# Patient Record
Sex: Male | Born: 1955
Health system: Southern US, Community
[De-identification: ages and names within clinical notes are randomized; demographics above are authoritative.]

## PROBLEM LIST (undated history)

## (undated) DIAGNOSIS — J449 Chronic obstructive pulmonary disease, unspecified: Secondary | ICD-10-CM

## (undated) DIAGNOSIS — S82142A Displaced bicondylar fracture of left tibia, initial encounter for closed fracture: Secondary | ICD-10-CM

## (undated) DIAGNOSIS — G8220 Paraplegia, unspecified: Secondary | ICD-10-CM

## (undated) DIAGNOSIS — K635 Polyp of colon: Secondary | ICD-10-CM

## (undated) DIAGNOSIS — G709 Myoneural disorder, unspecified: Secondary | ICD-10-CM

## (undated) DIAGNOSIS — Z8619 Personal history of other infectious and parasitic diseases: Secondary | ICD-10-CM

## (undated) DIAGNOSIS — I639 Cerebral infarction, unspecified: Secondary | ICD-10-CM

## (undated) DIAGNOSIS — B029 Zoster without complications: Secondary | ICD-10-CM

## (undated) HISTORY — PX: CHOLECYSTECTOMY: SHX55

## (undated) HISTORY — DX: Personal history of other infectious and parasitic diseases: Z86.19

## (undated) HISTORY — DX: Myoneural disorder, unspecified: G70.9

## (undated) HISTORY — PX: BACK SURGERY: SHX140

## (undated) HISTORY — PX: BUTTOCK MASS EXCISION: SHX1279

## (undated) HISTORY — DX: Zoster without complications: B02.9

## (undated) HISTORY — DX: Displaced bicondylar fracture of left tibia, initial encounter for closed fracture: S82.142A

## (undated) HISTORY — DX: Polyp of colon: K63.5

## (undated) HISTORY — DX: Chronic obstructive pulmonary disease, unspecified: J44.9

## (undated) HISTORY — PX: EYE SURGERY: SHX253

## (undated) HISTORY — PX: SPINE SURGERY: SHX786

---

## 2007-10-29 ENCOUNTER — Encounter: Payer: Self-pay | Admitting: Specialist

## 2007-11-20 ENCOUNTER — Encounter: Payer: Self-pay | Admitting: Specialist

## 2011-07-19 DIAGNOSIS — Z13220 Encounter for screening for lipoid disorders: Secondary | ICD-10-CM | POA: Diagnosis not present

## 2011-07-19 DIAGNOSIS — R7309 Other abnormal glucose: Secondary | ICD-10-CM | POA: Diagnosis not present

## 2011-07-19 DIAGNOSIS — K589 Irritable bowel syndrome without diarrhea: Secondary | ICD-10-CM | POA: Diagnosis not present

## 2011-07-19 DIAGNOSIS — M25519 Pain in unspecified shoulder: Secondary | ICD-10-CM | POA: Diagnosis not present

## 2011-07-19 DIAGNOSIS — Z125 Encounter for screening for malignant neoplasm of prostate: Secondary | ICD-10-CM | POA: Diagnosis not present

## 2012-01-10 DIAGNOSIS — B829 Intestinal parasitism, unspecified: Secondary | ICD-10-CM | POA: Diagnosis not present

## 2012-01-10 DIAGNOSIS — M25519 Pain in unspecified shoulder: Secondary | ICD-10-CM | POA: Diagnosis not present

## 2012-03-28 ENCOUNTER — Ambulatory Visit: Payer: Self-pay | Admitting: Family Medicine

## 2012-03-28 DIAGNOSIS — R238 Other skin changes: Secondary | ICD-10-CM | POA: Diagnosis not present

## 2012-03-28 DIAGNOSIS — M25519 Pain in unspecified shoulder: Secondary | ICD-10-CM | POA: Diagnosis not present

## 2012-03-28 DIAGNOSIS — S99919A Unspecified injury of unspecified ankle, initial encounter: Secondary | ICD-10-CM | POA: Diagnosis not present

## 2012-03-28 DIAGNOSIS — S8990XA Unspecified injury of unspecified lower leg, initial encounter: Secondary | ICD-10-CM | POA: Diagnosis not present

## 2012-04-03 ENCOUNTER — Emergency Department: Payer: Self-pay | Admitting: Emergency Medicine

## 2012-04-03 DIAGNOSIS — M79609 Pain in unspecified limb: Secondary | ICD-10-CM | POA: Diagnosis not present

## 2012-04-03 DIAGNOSIS — S90129A Contusion of unspecified lesser toe(s) without damage to nail, initial encounter: Secondary | ICD-10-CM | POA: Diagnosis not present

## 2012-04-03 DIAGNOSIS — S8990XA Unspecified injury of unspecified lower leg, initial encounter: Secondary | ICD-10-CM | POA: Diagnosis not present

## 2012-04-08 DIAGNOSIS — M79609 Pain in unspecified limb: Secondary | ICD-10-CM | POA: Diagnosis not present

## 2012-04-22 DIAGNOSIS — K5909 Other constipation: Secondary | ICD-10-CM | POA: Diagnosis not present

## 2012-04-22 DIAGNOSIS — M25519 Pain in unspecified shoulder: Secondary | ICD-10-CM | POA: Diagnosis not present

## 2012-05-20 DIAGNOSIS — I10 Essential (primary) hypertension: Secondary | ICD-10-CM | POA: Diagnosis not present

## 2012-05-20 DIAGNOSIS — E785 Hyperlipidemia, unspecified: Secondary | ICD-10-CM | POA: Diagnosis not present

## 2012-05-20 DIAGNOSIS — L98499 Non-pressure chronic ulcer of skin of other sites with unspecified severity: Secondary | ICD-10-CM | POA: Diagnosis not present

## 2012-05-20 DIAGNOSIS — I739 Peripheral vascular disease, unspecified: Secondary | ICD-10-CM | POA: Diagnosis not present

## 2012-07-29 DIAGNOSIS — Z125 Encounter for screening for malignant neoplasm of prostate: Secondary | ICD-10-CM | POA: Diagnosis not present

## 2012-07-29 DIAGNOSIS — R7309 Other abnormal glucose: Secondary | ICD-10-CM | POA: Diagnosis not present

## 2012-07-29 DIAGNOSIS — K589 Irritable bowel syndrome without diarrhea: Secondary | ICD-10-CM | POA: Diagnosis not present

## 2012-07-29 DIAGNOSIS — Z136 Encounter for screening for cardiovascular disorders: Secondary | ICD-10-CM | POA: Diagnosis not present

## 2012-07-29 LAB — LIPID PANEL
Cholesterol: 162 mg/dL (ref 0–200)
HDL: 68 mg/dL (ref 35–70)
LDL Cholesterol: 80 mg/dL
Triglycerides: 69 mg/dL (ref 40–160)
VLDL: 14 mg/dL

## 2013-06-02 DIAGNOSIS — K589 Irritable bowel syndrome without diarrhea: Secondary | ICD-10-CM | POA: Diagnosis not present

## 2013-06-02 DIAGNOSIS — J329 Chronic sinusitis, unspecified: Secondary | ICD-10-CM | POA: Diagnosis not present

## 2013-06-02 DIAGNOSIS — R7309 Other abnormal glucose: Secondary | ICD-10-CM | POA: Diagnosis not present

## 2014-03-16 DIAGNOSIS — J0100 Acute maxillary sinusitis, unspecified: Secondary | ICD-10-CM | POA: Diagnosis not present

## 2014-06-18 IMAGING — CR RIGHT FIFTH TOE
1 series · 3 of 3 positions shown · non-contrast
Comparison: none

REASON FOR EXAM: toe injury
COMMENTS:

[Series 1: ap · 0.17mm/px · 3 of 3 slices shown]
[im 1/3]
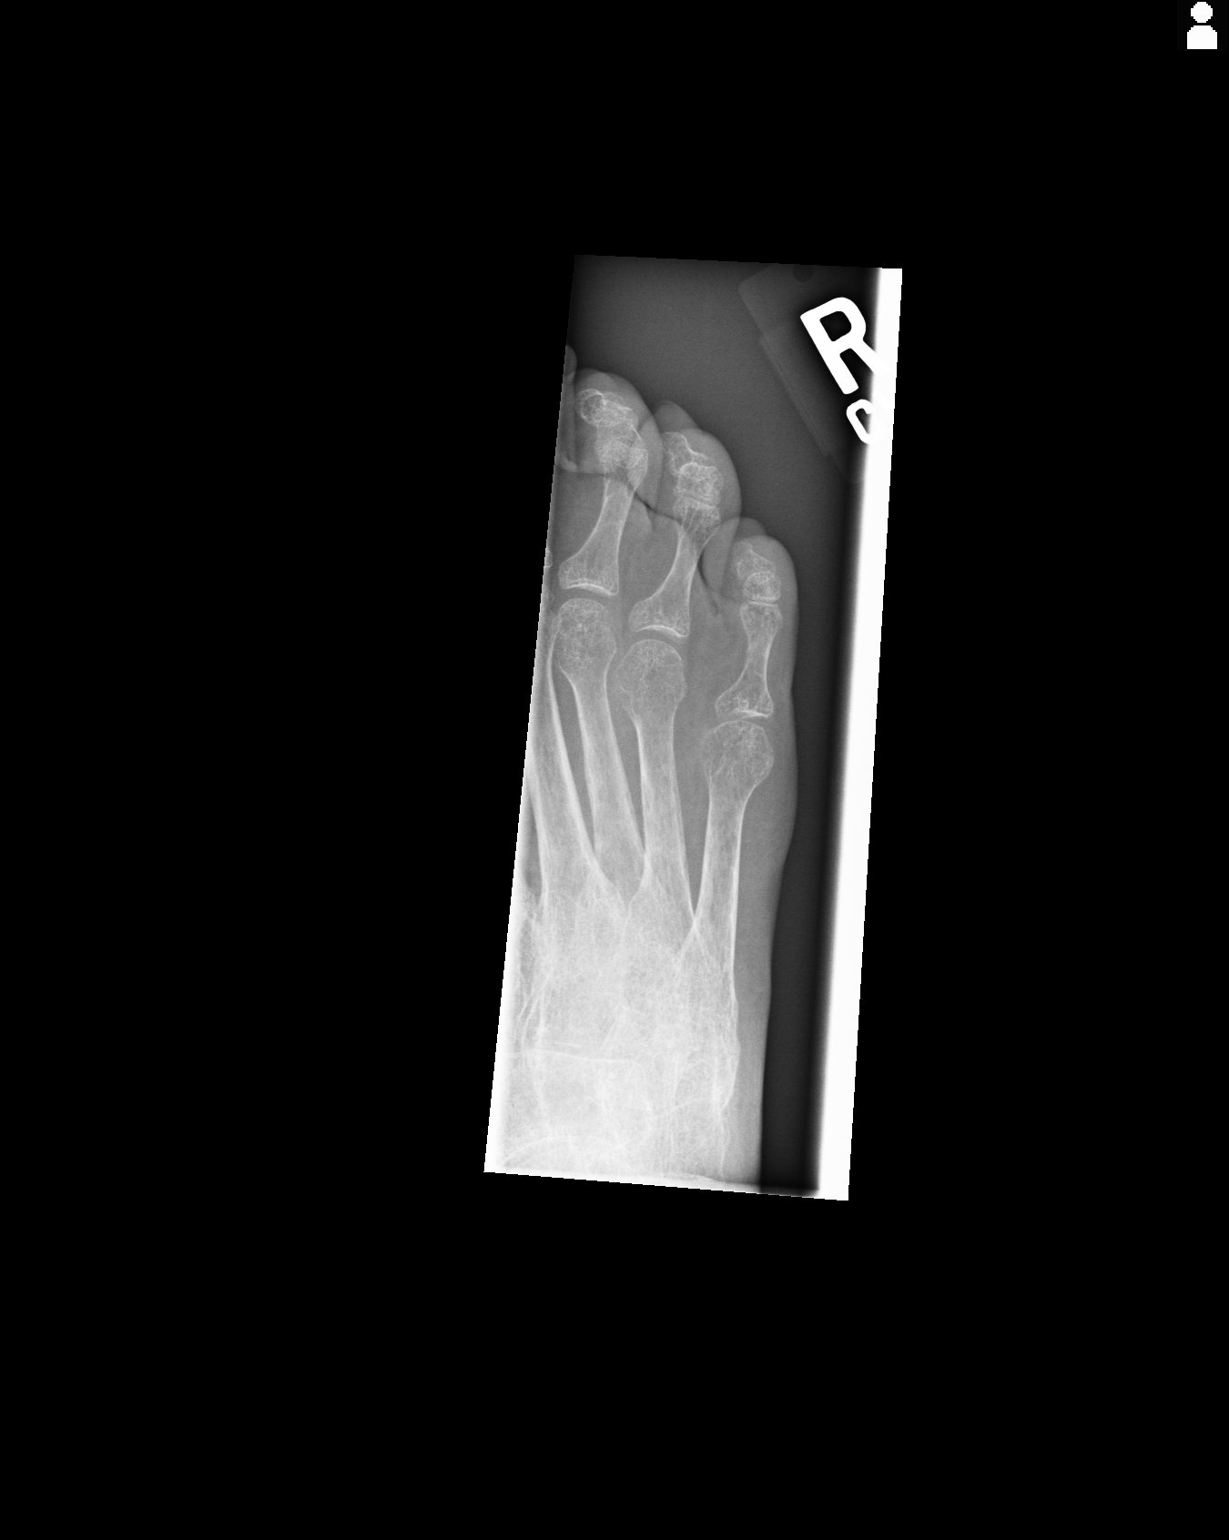
[im 2/3]
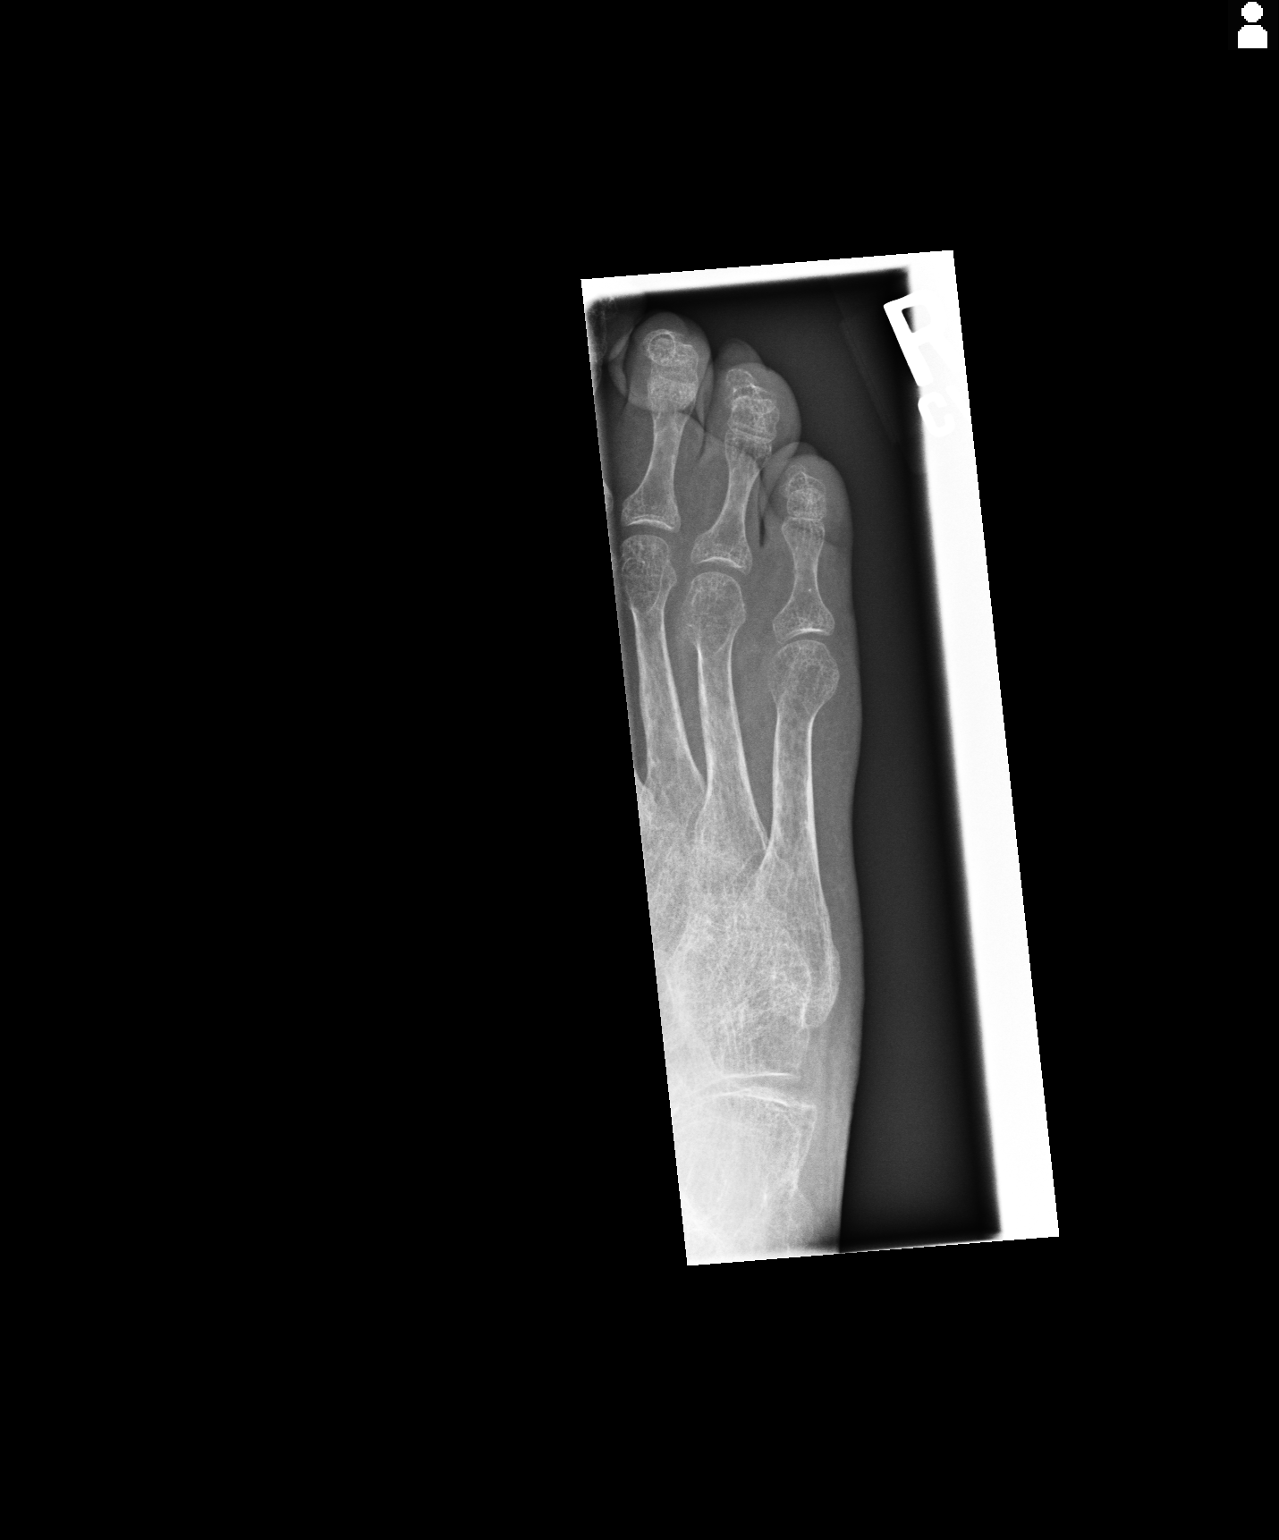
[im 3/3]
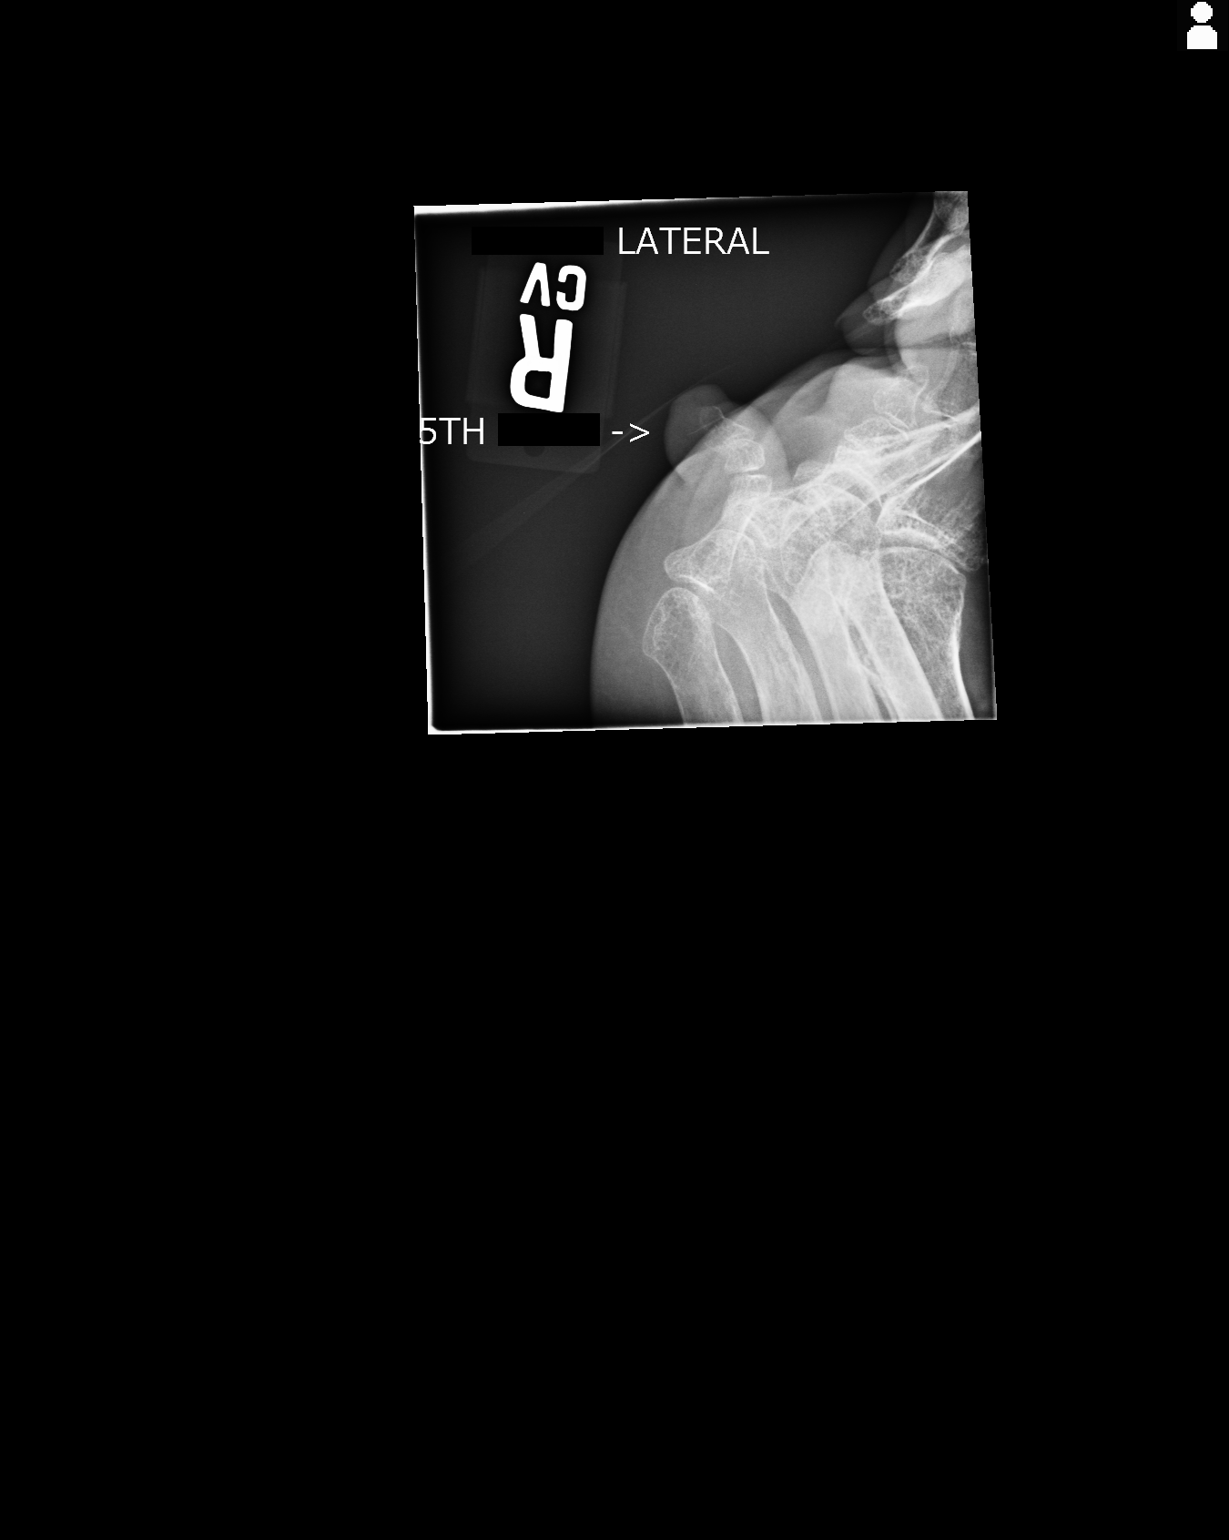

[3 of 3 positions shown; findings below may reference images not displayed]

PROCEDURE:     KDR - KDXR TOE 5TH DIGIT RIGHT FOOT  - March 28, 2012  [DATE]

RESULT:     Four views of the right fifth toe are submitted. On the lateral
film the toe is not well demonstrated in profile.

The bones are osteopenic. There is no evidence of periosteal reaction nor of
an acute fracture. The overlying soft tissues are normal in appearance.
IMPRESSION: There is no radiographic evidence of osteomyelitis but
there is a permeative pattern within all of the visualized metatarsals and
phalanges. No periosteal reaction nor lytic lesions are demonstrated. If
there are strong clinical concerns of occult infection, a three-phase
limited nuclear bone scan may be the most useful next step.

[REDACTED]

## 2014-09-28 DIAGNOSIS — R200 Anesthesia of skin: Secondary | ICD-10-CM | POA: Diagnosis not present

## 2014-09-28 DIAGNOSIS — R739 Hyperglycemia, unspecified: Secondary | ICD-10-CM | POA: Diagnosis not present

## 2014-09-28 DIAGNOSIS — I499 Cardiac arrhythmia, unspecified: Secondary | ICD-10-CM | POA: Diagnosis not present

## 2014-09-28 LAB — BASIC METABOLIC PANEL
BUN: 10 mg/dL (ref 4–21)
Creatinine: 0.6 mg/dL (ref 0.6–1.3)
Glucose: 78 mg/dL
Sodium: 139 mmol/L (ref 137–147)

## 2014-09-28 LAB — TSH: TSH: 1.4 u[IU]/mL (ref 0.41–5.90)

## 2014-09-28 LAB — CBC AND DIFFERENTIAL: WBC: 12 10*3/mL

## 2014-10-22 ENCOUNTER — Telehealth: Payer: Self-pay | Admitting: Family Medicine

## 2014-10-22 DIAGNOSIS — Z1211 Encounter for screening for malignant neoplasm of colon: Secondary | ICD-10-CM

## 2014-10-22 DIAGNOSIS — K5909 Other constipation: Secondary | ICD-10-CM

## 2014-10-22 NOTE — Telephone Encounter (Signed)
Pt states he is still having a problem with constipation.  Pt states he has stopped the medication he was advised to stop.  Pt has also tried Colace that was over the counter and that did not help.  Pt states he does not have a way to get here.  Pt is asking what else he can do?  ZO#[phone]/[id number]#[phone]/MJ

## 2014-10-23 NOTE — Telephone Encounter (Signed)
Recommend he take OTC Miralax every night Also, he is due for a colonoscopy as well. Recommend referral to GI for chronic constipation and screening colonoscopy

## 2014-10-23 NOTE — Telephone Encounter (Signed)
Patient was notified. Patient is agreeable to referral.

## 2014-10-28 ENCOUNTER — Telehealth: Payer: Self-pay | Admitting: Family Medicine

## 2014-10-28 NOTE — Telephone Encounter (Signed)
Pt is requesting a referral to have a home health nurse to come to his home daily.  Pt states he is having a hard time getting out of bed and getting dressed.  WU#[phone]/NFCB#[phone]/MJ

## 2014-10-28 NOTE — Telephone Encounter (Signed)
Please advise 

## 2014-10-29 ENCOUNTER — Emergency Department: Payer: Medicare Other

## 2014-10-29 ENCOUNTER — Emergency Department
Admission: EM | Admit: 2014-10-29 | Discharge: 2014-10-29 | Disposition: A | Discharge status: Home / Self Care | Payer: Medicare Other | Attending: Student | Admitting: Student

## 2014-10-29 ENCOUNTER — Encounter: Payer: Self-pay | Admitting: Emergency Medicine

## 2014-10-29 DIAGNOSIS — Z880 Allergy status to penicillin: Secondary | ICD-10-CM | POA: Insufficient documentation

## 2014-10-29 DIAGNOSIS — M545 Low back pain, unspecified: Secondary | ICD-10-CM

## 2014-10-29 DIAGNOSIS — Z720 Tobacco use: Secondary | ICD-10-CM | POA: Insufficient documentation

## 2014-10-29 DIAGNOSIS — M4317 Spondylolisthesis, lumbosacral region: Secondary | ICD-10-CM | POA: Diagnosis not present

## 2014-10-29 DIAGNOSIS — Z79899 Other long term (current) drug therapy: Secondary | ICD-10-CM | POA: Insufficient documentation

## 2014-10-29 DIAGNOSIS — M4312 Spondylolisthesis, cervical region: Secondary | ICD-10-CM | POA: Diagnosis not present

## 2014-10-29 DIAGNOSIS — K5900 Constipation, unspecified: Secondary | ICD-10-CM | POA: Diagnosis not present

## 2014-10-29 DIAGNOSIS — M5136 Other intervertebral disc degeneration, lumbar region: Secondary | ICD-10-CM | POA: Diagnosis not present

## 2014-10-29 DIAGNOSIS — R202 Paresthesia of skin: Secondary | ICD-10-CM | POA: Insufficient documentation

## 2014-10-29 DIAGNOSIS — R209 Unspecified disturbances of skin sensation: Secondary | ICD-10-CM | POA: Diagnosis not present

## 2014-10-29 DIAGNOSIS — M5031 Other cervical disc degeneration,  high cervical region: Secondary | ICD-10-CM | POA: Diagnosis not present

## 2014-10-29 DIAGNOSIS — M5032 Other cervical disc degeneration, mid-cervical region: Secondary | ICD-10-CM | POA: Diagnosis not present

## 2014-10-29 DIAGNOSIS — M47812 Spondylosis without myelopathy or radiculopathy, cervical region: Secondary | ICD-10-CM | POA: Diagnosis not present

## 2014-10-29 HISTORY — DX: Cerebral infarction, unspecified: I63.9

## 2014-10-29 LAB — COMPREHENSIVE METABOLIC PANEL
ALK PHOS: 98 U/L (ref 38–126)
ALT: 6 U/L — ABNORMAL LOW (ref 17–63)
AST: 16 U/L (ref 15–41)
Albumin: 4.1 g/dL (ref 3.5–5.0)
Anion gap: 9 (ref 5–15)
BUN: 12 mg/dL (ref 6–20)
CO2: 25 mmol/L (ref 22–32)
CREATININE: 0.59 mg/dL — AB (ref 0.61–1.24)
Calcium: 9.4 mg/dL (ref 8.9–10.3)
Chloride: 105 mmol/L (ref 101–111)
GFR calc Af Amer: >60 mL/min (ref >60)
GFR calc non Af Amer: >60 mL/min (ref >60)
GLUCOSE: 74 mg/dL (ref 65–99)
POTASSIUM: 3.7 mmol/L (ref 3.5–5.1)
Sodium: 139 mmol/L (ref 135–145)
Total Bilirubin: 1.2 mg/dL (ref 0.3–1.2)
Total Protein: 7.6 g/dL (ref 6.5–8.1)

## 2014-10-29 LAB — CBC WITH DIFFERENTIAL/PLATELET
Basophils Absolute: 0.1 10*3/uL (ref 0–0.1)
Basophils Relative: 1 %
EOS PCT: 1 %
Eosinophils Absolute: 0.1 10*3/uL (ref 0–0.7)
HCT: 45.2 % (ref 40.0–52.0)
Hemoglobin: 14.6 g/dL (ref 13.0–18.0)
LYMPHS ABS: 2.2 10*3/uL (ref 1.0–3.6)
LYMPHS PCT: 18 %
MCH: 29.8 pg (ref 26.0–34.0)
MCHC: 32.4 g/dL (ref 32.0–36.0)
MCV: 91.9 fL (ref 80.0–100.0)
Monocytes Absolute: 0.9 10*3/uL (ref 0.2–1.0)
Monocytes Relative: 7 %
NEUTROS PCT: 73 %
Neutro Abs: 9.2 10*3/uL — ABNORMAL HIGH (ref 1.4–6.5)
Platelets: 191 10*3/uL (ref 150–440)
RBC: 4.91 MIL/uL (ref 4.40–5.90)
RDW: 15 % — ABNORMAL HIGH (ref 11.5–14.5)
WBC: 12.6 10*3/uL — AB (ref 3.8–10.6)

## 2014-10-29 LAB — URINALYSIS COMPLETE WITH MICROSCOPIC (ARMC ONLY)
Bilirubin Urine: NEGATIVE
GLUCOSE, UA: NEGATIVE mg/dL
KETONES UR: NEGATIVE mg/dL
LEUKOCYTES UA: NEGATIVE
NITRITE: NEGATIVE
PROTEIN: 30 mg/dL — AB
SQUAMOUS EPITHELIAL / LPF: NONE SEEN
Specific Gravity, Urine: 1.028 (ref 1.005–1.030)
pH: 5 (ref 5.0–8.0)

## 2014-10-29 MED ORDER — MORPHINE SULFATE 4 MG/ML IJ SOLN
4.0000 mg | Freq: Once | INTRAMUSCULAR | Status: AC
  Administered 2014-10-29: 4 mg via INTRAMUSCULAR

## 2014-10-29 MED ORDER — HYDROCODONE-ACETAMINOPHEN 5-325 MG PO TABS: 1.0000 | ORAL_TABLET | Freq: Four times a day (QID) | ORAL | Status: DC | PRN

## 2014-10-29 MED ORDER — ORPHENADRINE CITRATE ER 100 MG PO TB12: 100.0000 mg | ORAL_TABLET | Freq: Two times a day (BID) | ORAL | Status: DC

## 2014-10-29 MED ORDER — MORPHINE SULFATE 4 MG/ML IJ SOLN
INTRAMUSCULAR | Status: AC
  Administered 2014-10-29: 4 mg via INTRAMUSCULAR
  Filled 2014-10-29: qty 1
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
## 2014-10-29 NOTE — Discharge Instructions (Signed)
Back Exercises Back exercises help treat and prevent back injuries. The goal is to increase your strength in your belly (abdominal) and back muscles. These exercises can also help with flexibility. Start these exercises when told by your doctor. HOME CARE Back exercises include: Pelvic Tilt.  Lie on your back with your knees bent. Tilt your pelvis until the lower part of your back is against the floor. Hold this position 5 to 10 sec. Repeat this exercise 5 to 10 times. Knee to Chest.  Pull 1 knee up against your chest and hold for 20 to 30 seconds. Repeat this with the other knee. This may be done with the other leg straight or bent, whichever feels better. Then, pull both knees up against your chest. Sit-Ups or Curl-Ups.  Bend your knees 90 degrees. Start with tilting your pelvis, and do a partial, slow sit-up. Only lift your upper half 30 to 45 degrees off the floor. Take at least 2 to 3 seonds for each sit-up. Do not do sit-ups with your knees out straight. If partial sit-ups are difficult, simply do the above but with only tightening your belly (abdominal) muscles and holding it as told. Hip-Lift.  Lie on your back with your knees flexed 90 degrees. Push down with your feet and shoulders as you raise your hips 2 inches off the floor. Hold for 10 seconds, repeat 5 to 10 times. Back Arches.  Lie on your stomach. Prop yourself up on bent elbows. Slowly press on your hands, causing an arch in your low back. Repeat 3 to 5 times. Shoulder-Lifts.  Lie face down with arms beside your body. Keep hips and belly pressed to floor as you slowly lift your head and shoulders off the floor. Do not overdo your exercises. Be careful in the beginning. Exercises may cause you some mild back discomfort. If the pain lasts for more than 15 minutes, stop the exercises until you see your doctor. Improvement with exercise for back problems is slow.  Document Released: 06/10/2010 Document Revised: 07/31/2011  Document Reviewed: 03/09/2011 [facility] Patient Information 2015 [place], [place]. This information is not intended to replace advice given to you by your health care provider. Make sure you discuss any questions you have with your health care provider.  Back Pain, Adult Back pain is very common. The pain often gets better over time. The cause of back pain is usually not dangerous. Most people can learn to manage their back pain on their own.  HOME CARE   Stay active. Start with short walks on flat ground if you can. Try to walk farther each day.  Do not sit, drive, or stand in one place for more than 30 minutes. Do not stay in bed.  Do not avoid exercise or work. Activity can help your back heal faster.  Be careful when you bend or lift an object. Bend at your knees, keep the object close to you, and do not twist.  Sleep on a firm mattress. Lie on your side, and bend your knees. If you lie on your back, put a pillow under your knees.  Only take medicines as told by your doctor.  Put ice on the injured area.  Put ice in a plastic bag.  Place a towel between your skin and the bag.  Leave the ice on for 15-20 minutes, 03-04 times a day for the first 2 to 3 days. After that, you can switch between ice and heat packs.  Ask your doctor about back exercises  or massage.  Avoid feeling anxious or stressed. Find good ways to deal with stress, such as exercise. GET HELP RIGHT AWAY IF:   Your pain does not go away with rest or medicine.  Your pain does not go away in 1 week.  You have new problems.  You do not feel well.  The pain spreads into your legs.  You cannot control when you poop (bowel movement) or pee (urinate).  Your arms or legs feel weak or lose feeling (numbness).  You feel sick to your stomach (nauseous) or throw up (vomit).  You have belly (abdominal) pain.  You feel like you may pass out (faint). MAKE SURE YOU:   Understand these instructions.  Will watch  your condition.  Will get help right away if you are not doing well or get worse. Document Released: 10/25/2007 Document Revised: 07/31/2011 Document Reviewed: 09/09/2013 [facility] Patient Information 2015 [place], [place]. This information is not intended to replace advice given to you by your health care provider. Make sure you discuss any questions you have with your health care provider.  Constipation Constipation is when a person has fewer than three bowel movements a week, has difficulty having a bowel movement, or has stools that are dry, hard, or larger than normal. As people grow older, constipation is more common. If you try to fix constipation with medicines that make you have a bowel movement (laxatives), the problem may get worse. Long-term laxative use may cause the muscles of the colon to become weak. A low-fiber diet, not taking in enough fluids, and taking certain medicines may make constipation worse.  CAUSES   Certain medicines, such as antidepressants, pain medicine, iron supplements, antacids, and water pills.   Certain diseases, such as diabetes, irritable bowel syndrome (IBS), thyroid disease, or depression.   Not drinking enough water.   Not eating enough fiber-rich foods.   Stress or travel.   Lack of physical activity or exercise.   Ignoring the urge to have a bowel movement.   Using laxatives too much.  SIGNS AND SYMPTOMS   Having fewer than three bowel movements a week.   Straining to have a bowel movement.   Having stools that are hard, dry, or larger than normal.   Feeling full or bloated.   Pain in the lower abdomen.   Not feeling relief after having a bowel movement.  DIAGNOSIS  Your health care provider will take a medical history and perform a physical exam. Further testing may be done for severe constipation. Some tests may include:  A barium enema X-ray to examine your rectum, colon, and, sometimes, your small intestine.   A  sigmoidoscopy to examine your lower colon.   A colonoscopy to examine your entire colon. TREATMENT  Treatment will depend on the severity of your constipation and what is causing it. Some dietary treatments include drinking more fluids and eating more fiber-rich foods. Lifestyle treatments may include regular exercise. If these diet and lifestyle recommendations do not help, your health care provider may recommend taking over-the-counter laxative medicines to help you have bowel movements. Prescription medicines may be prescribed if over-the-counter medicines do not work.  HOME CARE INSTRUCTIONS   Eat foods that have a lot of fiber, such as fruits, vegetables, whole grains, and beans.  Limit foods high in fat and processed sugars, such as french fries, hamburgers, cookies, candies, and soda.   A fiber supplement may be added to your diet if you cannot get enough fiber from foods.  Drink enough fluids to keep your urine clear or pale yellow.   Exercise regularly or as directed by your health care provider.   Go to the restroom when you have the urge to go. Do not hold it.   Only take over-the-counter or prescription medicines as directed by your health care provider. Do not take other medicines for constipation without talking to your health care provider first.  SEEK IMMEDIATE MEDICAL CARE IF:   You have bright red blood in your stool.   Your constipation lasts for more than 4 days or gets worse.   You have abdominal or rectal pain.   You have thin, pencil-like stools.   You have unexplained weight loss. MAKE SURE YOU:   Understand these instructions.  Will watch your condition.  Will get help right away if you are not doing well or get worse. Document Released: 02/04/2004 Document Revised: 05/13/2013 Document Reviewed: 02/17/2013 [facility] Patient Information 2015 [place], [place]. This information is not intended to replace advice given to you by your health care  provider. Make sure you discuss any questions you have with your health care provider.   Take pain medicine as directed for pain control. Use laxatives or enemas to treat constipation. Follow up with your primary physician for further evaluation.

## 2014-10-29 NOTE — ED Notes (Signed)
Pt here with lower back pain that began after injuring it last week, states his arms and fingers bilaterally are going numb at times.

## 2014-10-30 NOTE — Telephone Encounter (Signed)
OK for home health referral.  

## 2014-11-09 NOTE — Telephone Encounter (Signed)
Order for [facility] sent back to Dr Sherrie Mustache to sign

## 2014-11-11 ENCOUNTER — Telehealth: Payer: Self-pay | Admitting: *Deleted

## 2014-11-11 NOTE — Telephone Encounter (Signed)
Please call Patty for more info. Thanks.

## 2014-11-11 NOTE — Telephone Encounter (Signed)
Patty from medical transportation called office concerning necessity form that was faxed over 11/03/2014.

## 2014-11-12 ENCOUNTER — Telehealth: Payer: Self-pay | Admitting: Family Medicine

## 2014-11-12 NOTE — Telephone Encounter (Signed)
Order faxed to [facility] 11-11-14

## 2014-11-12 NOTE — Telephone Encounter (Signed)
Lee Clark with [employer] called to see if we had faxed the form back that she faxed to our office on 11/03/14. Lee Clark would like to speak with a nurse and I advised her to re-fax the form and confirmed our fax #. Thanks TNP

## 2014-11-16 DIAGNOSIS — G8220 Paraplegia, unspecified: Secondary | ICD-10-CM | POA: Diagnosis not present

## 2014-11-16 DIAGNOSIS — G629 Polyneuropathy, unspecified: Secondary | ICD-10-CM | POA: Diagnosis not present

## 2014-11-16 DIAGNOSIS — R7309 Other abnormal glucose: Secondary | ICD-10-CM | POA: Diagnosis not present

## 2014-11-16 DIAGNOSIS — S34109A Unspecified injury to unspecified level of lumbar spinal cord, initial encounter: Secondary | ICD-10-CM | POA: Diagnosis not present

## 2014-11-16 DIAGNOSIS — M6281 Muscle weakness (generalized): Secondary | ICD-10-CM | POA: Diagnosis not present

## 2014-11-19 DIAGNOSIS — G8220 Paraplegia, unspecified: Secondary | ICD-10-CM | POA: Diagnosis not present

## 2014-11-19 DIAGNOSIS — Z9181 History of falling: Secondary | ICD-10-CM | POA: Diagnosis not present

## 2014-11-19 DIAGNOSIS — Z993 Dependence on wheelchair: Secondary | ICD-10-CM | POA: Diagnosis not present

## 2014-11-19 DIAGNOSIS — S34109S Unspecified injury to unspecified level of lumbar spinal cord, sequela: Secondary | ICD-10-CM | POA: Diagnosis not present

## 2014-11-19 DIAGNOSIS — I252 Old myocardial infarction: Secondary | ICD-10-CM | POA: Diagnosis not present

## 2014-11-19 DIAGNOSIS — F17210 Nicotine dependence, cigarettes, uncomplicated: Secondary | ICD-10-CM | POA: Diagnosis not present

## 2014-11-19 DIAGNOSIS — M6281 Muscle weakness (generalized): Secondary | ICD-10-CM | POA: Diagnosis not present

## 2014-11-19 DIAGNOSIS — F329 Major depressive disorder, single episode, unspecified: Secondary | ICD-10-CM | POA: Diagnosis not present

## 2014-11-24 ENCOUNTER — Telehealth: Payer: Self-pay | Admitting: Family Medicine

## 2014-11-24 DIAGNOSIS — S82142A Displaced bicondylar fracture of left tibia, initial encounter for closed fracture: Secondary | ICD-10-CM

## 2014-11-24 DIAGNOSIS — L8990 Pressure ulcer of unspecified site, unspecified stage: Secondary | ICD-10-CM

## 2014-11-24 NOTE — Telephone Encounter (Signed)
Tried calling patient to get more information. Left message to call back. Need to know if this is new, how long he has had it, has he ever seen Dr. Sherrie MustacheFisher for this (I couldnt find a recent office visit note for sore on buttocks). Is this like a bed sore?

## 2014-11-24 NOTE — Telephone Encounter (Signed)
[facility] on [facility]ocks.  Please call him at [phone]

## 2014-11-25 ENCOUNTER — Emergency Department
Admission: EM | Admit: 2014-11-25 | Discharge: 2014-11-25 | Disposition: A | Discharge status: Home / Self Care | Payer: Medicare Other | Attending: Emergency Medicine | Admitting: Emergency Medicine

## 2014-11-25 ENCOUNTER — Emergency Department: Payer: Medicare Other

## 2014-11-25 DIAGNOSIS — S82142A Displaced bicondylar fracture of left tibia, initial encounter for closed fracture: Secondary | ICD-10-CM | POA: Diagnosis not present

## 2014-11-25 DIAGNOSIS — I82402 Acute embolism and thrombosis of unspecified deep veins of left lower extremity: Secondary | ICD-10-CM | POA: Diagnosis not present

## 2014-11-25 DIAGNOSIS — L8990 Pressure ulcer of unspecified site, unspecified stage: Secondary | ICD-10-CM

## 2014-11-25 DIAGNOSIS — Z720 Tobacco use: Secondary | ICD-10-CM | POA: Diagnosis not present

## 2014-11-25 DIAGNOSIS — L89319 Pressure ulcer of right buttock, unspecified stage: Secondary | ICD-10-CM | POA: Insufficient documentation

## 2014-11-25 DIAGNOSIS — M84462A Pathological fracture, left tibia, initial encounter for fracture: Secondary | ICD-10-CM | POA: Insufficient documentation

## 2014-11-25 DIAGNOSIS — I82512 Chronic embolism and thrombosis of left femoral vein: Secondary | ICD-10-CM | POA: Insufficient documentation

## 2014-11-25 DIAGNOSIS — R202 Paresthesia of skin: Secondary | ICD-10-CM | POA: Diagnosis not present

## 2014-11-25 DIAGNOSIS — Z880 Allergy status to penicillin: Secondary | ICD-10-CM | POA: Diagnosis not present

## 2014-11-25 DIAGNOSIS — Z79899 Other long term (current) drug therapy: Secondary | ICD-10-CM | POA: Insufficient documentation

## 2014-11-25 DIAGNOSIS — S82141A Displaced bicondylar fracture of right tibia, initial encounter for closed fracture: Secondary | ICD-10-CM | POA: Diagnosis not present

## 2014-11-25 DIAGNOSIS — I82412 Acute embolism and thrombosis of left femoral vein: Secondary | ICD-10-CM | POA: Diagnosis not present

## 2014-11-25 DIAGNOSIS — L89890 Pressure ulcer of other site, unstageable: Secondary | ICD-10-CM | POA: Diagnosis not present

## 2014-11-25 HISTORY — DX: Paraplegia, unspecified: G82.20

## 2014-11-25 LAB — CBC
HCT: 43.1 % (ref 40.0–52.0)
Hemoglobin: 14.1 g/dL (ref 13.0–18.0)
MCH: 29.5 pg (ref 26.0–34.0)
MCHC: 32.8 g/dL (ref 32.0–36.0)
MCV: 90.1 fL (ref 80.0–100.0)
PLATELETS: 250 10*3/uL (ref 150–440)
RBC: 4.79 MIL/uL (ref 4.40–5.90)
RDW: 14.7 % — AB (ref 11.5–14.5)
WBC: 11.7 10*3/uL — AB (ref 3.8–10.6)

## 2014-11-25 LAB — COMPREHENSIVE METABOLIC PANEL
ALBUMIN: 3.9 g/dL (ref 3.5–5.0)
ALK PHOS: 180 U/L — AB (ref 38–126)
ALT: 7 U/L — ABNORMAL LOW (ref 17–63)
AST: 16 U/L (ref 15–41)
Anion gap: 9 (ref 5–15)
BUN: 15 mg/dL (ref 6–20)
CALCIUM: 8.9 mg/dL (ref 8.9–10.3)
CO2: 27 mmol/L (ref 22–32)
Chloride: 101 mmol/L (ref 101–111)
Creatinine, Ser: 0.56 mg/dL — ABNORMAL LOW (ref 0.61–1.24)
GFR calc Af Amer: >60 mL/min (ref >60)
GFR calc non Af Amer: >60 mL/min (ref >60)
Glucose, Bld: 103 mg/dL — ABNORMAL HIGH (ref 65–99)
POTASSIUM: 4.1 mmol/L (ref 3.5–5.1)
SODIUM: 137 mmol/L (ref 135–145)
TOTAL PROTEIN: 8 g/dL (ref 6.5–8.1)
Total Bilirubin: 0.5 mg/dL (ref 0.3–1.2)

## 2014-11-25 MED ORDER — OXYCODONE-ACETAMINOPHEN 5-325 MG PO TABS
1.0000 | ORAL_TABLET | Freq: Once | ORAL | Status: AC
  Administered 2014-11-25: 1 via ORAL

## 2014-11-25 MED ORDER — SULFAMETHOXAZOLE-TRIMETHOPRIM 800-160 MG PO TABS
ORAL_TABLET | ORAL | Status: AC
  Administered 2014-11-25: 1 via ORAL
  Filled 2014-11-25: qty 1

## 2014-11-25 MED ORDER — SULFAMETHOXAZOLE-TRIMETHOPRIM 800-160 MG PO TABS
1.0000 | ORAL_TABLET | Freq: Once | ORAL | Status: AC
  Administered 2014-11-25: 1 via ORAL

## 2014-11-25 MED ORDER — SULFAMETHOXAZOLE-TRIMETHOPRIM 800-160 MG PO TABS: 1.0000 | ORAL_TABLET | Freq: Two times a day (BID) | ORAL | Status: DC

## 2014-11-25 MED ORDER — OXYCODONE-ACETAMINOPHEN 5-325 MG PO TABS
ORAL_TABLET | ORAL | Status: AC
  Administered 2014-11-25: 1 via ORAL
  Filled 2014-11-25: qty 1

## 2014-11-25 MED ORDER — ASPIRIN EC 81 MG PO TBEC: 81.0000 mg | DELAYED_RELEASE_TABLET | Freq: Every day | ORAL | Status: AC

## 2014-11-25 MED ORDER — ACETAMINOPHEN-CODEINE #3 300-30 MG PO TABS: 1.0000 | ORAL_TABLET | ORAL | Status: DC | PRN

## 2014-11-25 NOTE — ED Notes (Signed)
Patient refused to be assisted onto stretcher from his wheelchair. Patient states he will get on the stretcher when the MD comes into the room. Unable to assess decubitus on right buttocks due to patient sitting on his wheelchair.

## 2014-11-25 NOTE — ED Notes (Signed)
Pt states he has a sore/wound on the right buttock that started as a skin tear from visit here 10/29/14 that had been healing well until the past week the scab has came off and is having bleeding from the site.Lee Clark.denies foul odor..Lee Clark
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
## 2014-11-25 NOTE — Discharge Instructions (Signed)
Deep Vein Thrombosis °A deep vein thrombosis (DVT) is a blood clot that develops in the deep, larger veins of the leg, arm, or pelvis. These are more dangerous than clots that might form in veins near the surface of the body. A DVT can lead to serious and even life-threatening complications if the clot breaks off and travels in the bloodstream to the lungs.  °A DVT can damage the valves in your leg veins so that instead of flowing upward, the blood pools in the lower leg. This is called post-thrombotic syndrome, and it can result in pain, swelling, discoloration, and sores on the leg. °CAUSES °Usually, several things contribute to the formation of blood clots. Contributing factors include: °· The flow of blood slows down. °· The inside of the vein is damaged in some way. °· You have a condition that makes blood clot more easily. °RISK FACTORS °Some people are more likely than others to develop blood clots. Risk factors include:  °· Smoking. °· Being overweight (obese). °· Sitting or lying still for a long time. This includes long-distance travel, paralysis, or recovery from an illness or surgery. °Other factors that increase risk are:  °· Older age, especially over 75 years of age. °· Having a family history of blood clots or if you have already had a blot clot. °· Having major or lengthy surgery. This is especially true for surgery on the hip, knee, or belly (abdomen). Hip surgery is particularly high risk. °· Having a long, thin tube (catheter) placed inside a vein during a medical procedure. °· Breaking a hip or leg. °· Having cancer or cancer treatment. °· Pregnancy and childbirth. °¨ Hormone changes make the blood clot more easily during pregnancy. °¨ The fetus puts pressure on the veins of the pelvis. °¨ There is a risk of injury to veins during delivery or a caesarean delivery. The risk is highest just after childbirth. °· Medicines containing the male hormone estrogen. This includes birth control pills and  hormone replacement therapy. °· Other circulation or heart problems. ° °SIGNS AND SYMPTOMS °When a clot forms, it can either partially or totally block the blood flow in that vein. Symptoms of a DVT can include: °· Swelling of the leg or arm, especially if one side is much worse. °· Warmth and redness of the leg or arm, especially if one side is much worse. °· Pain in an arm or leg. If the clot is in the leg, symptoms may be more noticeable or worse when standing or walking. °The symptoms of a DVT that has traveled to the lungs (pulmonary embolism, PE) usually start suddenly and include: °· Shortness of breath. °· Coughing. °· Coughing up blood or blood-tinged mucus. °· Chest pain. The chest pain is often worse with deep breaths. °· Rapid heartbeat. °Anyone with these symptoms should get emergency medical treatment right away. Do not wait to see if the symptoms will go away. Call your local emergency services (911 in the [place] if you have these symptoms. Do not drive yourself to the hospital. °DIAGNOSIS °If a DVT is suspected, your health care provider will take a full medical history and perform a physical exam. Tests that also may be required include: °· Blood tests, including studies of the clotting properties of the blood. °· Ultrasound to see if you have clots in your legs or lungs. °· X-rays to show the flow of blood when dye is injected into the veins (venogram). °· Studies of your lungs if you have any   chest symptoms. °PREVENTION °· Exercise the legs regularly. Take a brisk 30-minute walk every day. °· Maintain a weight that is appropriate for your height. °· Avoid sitting or lying in bed for long periods of time without moving your legs. °· Women, particularly those over the age of 35 years, should consider the risks and benefits of taking estrogen medicines, including birth control pills. °· Do not smoke, especially if you take estrogen medicines. °· Long-distance travel can increase your risk of DVT. You  should exercise your legs by walking or pumping the muscles every hour. °· Many of the risk factors above relate to situations that exist with hospitalization, either for illness, injury, or elective surgery. Prevention may include medical and nonmedical measures. °¨ Your health care provider will assess you for the need for venous thromboembolism prevention when you are admitted to the hospital. If you are having surgery, your surgeon will assess you the day of or day after surgery. °TREATMENT °Once identified, a DVT can be treated. It can also be prevented in some circumstances. Once you have had a DVT, you may be at increased risk for a DVT in the future. The most common treatment for DVT is blood-thinning (anticoagulant) medicine, which reduces the blood's tendency to clot. Anticoagulants can stop new blood clots from forming and stop old clots from growing. They cannot dissolve existing clots. Your body does this by itself over time. Anticoagulants can be given by mouth, through an IV tube, or by injection. Your health care provider will determine the best program for you. Other medicines or treatments that may be used are: °· Heparin or related medicines (low molecular weight heparin) are often the first treatment for a blood clot. They act quickly. However, they cannot be taken orally and must be given either in shot form or by IV tube. °¨ Heparin can cause a fall in a component of blood that stops bleeding and forms blood clots (platelets). You will be monitored with blood tests to be sure this does not occur. °· Warfarin is an anticoagulant that can be swallowed. It takes a few days to start working, so usually heparin or related medicines are used in combination. Once warfarin is working, heparin is usually stopped. °· Factor Xa inhibitor medicines, such as rivaroxaban and apixaban, also reduce blood clotting. These medicines are taken orally and can often be used without heparin or related  medicines. °· Less commonly, clot dissolving drugs (thrombolytics) are used to dissolve a DVT. They carry a high risk of bleeding, so they are used mainly in severe cases where your life or a part of your body is threatened. °· Very rarely, a blood clot in the leg needs to be removed surgically. °· If you are unable to take anticoagulants, your health care provider may arrange for you to have a filter placed in a main vein in your abdomen. This filter prevents clots from traveling to your lungs. °HOME CARE INSTRUCTIONS °· Take all medicines as directed by your health care provider. °· Learn as much as you can about DVT. °· Wear a medical alert bracelet or carry a medical alert card. °· Ask your health care provider how soon you can go back to normal activities. It is important to stay active to prevent blood clots. If you are on anticoagulant medicine, avoid contact sports. °· It is very important to exercise. This is especially important while traveling, sitting, or standing for long periods of time. Exercise your legs by walking or by   tightening and relaxing your leg muscles regularly. Take frequent walks. °· You may need to wear compression stockings. These are tight elastic stockings that apply pressure to the lower legs. This pressure can help keep the blood in the legs from clotting. °Taking Warfarin °Warfarin is a daily medicine that is taken by mouth. Your health care provider will advise you on the length of treatment (usually 3-6 months, sometimes lifelong). If you take warfarin: °· Understand how to take warfarin and foods that can affect how warfarin works in your body. °· Too much and too little warfarin are both dangerous. Too much warfarin increases the risk of bleeding. Too little warfarin continues to allow the risk for blood clots. °Warfarin and Regular Blood Testing °While taking warfarin, you will need to have regular blood tests to measure your blood clotting time. These blood tests usually  include both the prothrombin time (PT) and international normalized ratio (INR) tests. The PT and INR results allow your health care provider to adjust your dose of warfarin. It is very important that you have your PT and INR tested as often as directed by your health care provider.    °Warfarin and Your Diet °Avoid major changes in your diet, or notify your health care provider before changing your diet. Arrange a visit with a registered dietitian to answer your questions. Many foods, especially foods high in vitamin K, can interfere with warfarin and affect the PT and INR results. You should eat a consistent amount of foods high in vitamin K. Foods high in vitamin K include:  °· Spinach, kale, broccoli, cabbage, collard and turnip greens, Brussels sprouts, peas, cauliflower, seaweed, and parsley. °· Beef and pork liver. °· Green tea. °· Soybean oil. °Warfarin with Other Medicines °Many medicines can interfere with warfarin and affect the PT and INR results. You must: °· Tell your health care provider about any and all medicines, vitamins, and supplements you take, including aspirin and other over-the-counter anti-inflammatory medicines. Be especially cautious with aspirin and anti-inflammatory medicines. Ask your health care provider before taking these. °· Do not take or discontinue any prescribed or over-the-counter medicine except on the advice of your health care provider or pharmacist. °Warfarin Side Effects °Warfarin can have side effects, such as easy bruising and difficulty stopping bleeding. Ask your health care provider or pharmacist about other side effects of warfarin. You will need to: °· Hold pressure over cuts for longer than usual. °· Notify your dentist and other health care providers that you are taking warfarin before you undergo any procedures where bleeding may occur. °Warfarin with Alcohol and Tobacco  °· Drinking alcohol frequently can increase the effect of warfarin, leading to excess  bleeding. It is best to avoid alcoholic drinks or to consume only very small amounts while taking warfarin. Notify your health care provider if you change your alcohol intake.   °· Do not use any tobacco products including cigarettes, chewing tobacco, or electronic cigarettes. If you smoke, quit. Ask your health care provider for help with quitting smoking. °Alternative Medicines to Warfarin: Factor Xa Inhibitor Medicines °· These blood-thinning medicines are taken by mouth, usually for several weeks or longer. It is important to take the medicine every single day at the same time each day. °· There are no regular blood tests required when using these medicines. °· There are fewer food and drug interactions than with warfarin. °· The side effects of this class of medicine are similar to those of warfarin, including excessive bruising or bleeding. Ask your   health care provider or pharmacist about other potential side effects. SEEK MEDICAL CARE IF:  You notice a rapid heartbeat.  You feel weaker or more tired than usual.  You feel faint.  You notice increased bruising.  You feel your symptoms are not getting better in the time expected.  You believe you are having side effects of medicine. SEEK IMMEDIATE MEDICAL CARE IF:  You have chest pain.  You have trouble breathing.  You have new or increased swelling or pain in one leg.  You cough up blood.  You notice blood in vomit, in a bowel movement, or in urine. MAKE SURE YOU:  Understand these instructions.  Will watch your condition.  Will get help right away if you are not doing well or get worse. Document Released: 05/08/2005 Document Revised: 09/22/2013 Document Reviewed: 01/13/2013 [facility] Patient Information 2015 [place], [place]. This information is not intended to replace advice given to you by your health care provider. Make sure you discuss any questions you have with your health care provider.  Tibial Plateau Fracture,  Displaced, Adult You have a fracture (break in bone) of your tibial plateau. This is a fracture in the upper part of the large "shin" bone (tibia) in your lower leg. The plateau is the joint surface that buts up against the femur (thigh bone of your upper leg). This is what makes up your knee joint. Displaced means that a portion of the fracture is out of place from where the bone is supposed to be. Because this fracture goes into the knee joint, it is necessary that this fracture be fixed in the best position possible. This fracture must be fixed surgically. This means an operation must be done to get the bones into the best possible position for healing. Otherwise, this fracture can cause severe arthritis and marked disability over the years. This is likely to occur even with the best treatment. These fractures are generally diagnosed with x-rays. Often a CT scan is needed to identify the fracture fragments and the degree of displacement. TREATMENT  Your fracture will be reduced (bones fragments are put back into position) and held in place with hardware (fixation devices). When your caregiver feels the fracture is healed well enough, you may begin range of motion exercises to keep your knee limber (moving well). This may be very difficult at first. It is necessary to follow the instructions of all your caregivers, your surgeon, and physical therapist following surgery. LET YOUR CAREGIVER KNOW ABOUT:  Allergies  Medications taken including herbs, eye drops, over the counter medications, and creams  Use of steroids (by mouth or creams)  History of bleeding or blood problems  Previous problems with anesthetics or novocaine  Possibility of pregnancy, if this applies  History of blood clots (thrombophlebitis)  Previous surgery  Other health problems RISKS AND COMPLICATIONS All surgery is associated with risks. Some of these risks are:  Excessive bleeding.  Infection.  Failure to heal  properly resulting in an unstable knee.  Stiffness of knee following repair.  Need to remove the hardware. BEFORE AND AFTER YOUR PROCEDURE Prior to surgery, an IV (intravenous line connected to your vein for giving fluids) may be started. You will also be given an anesthetic. These are medicines and gas to make you sleep. You may also be given regional anesthesia such as a spinal or epidural anesthetic. After surgery, you will be taken to the recovery area where a nurse will monitor your progress. You may have a catheter (a  long, narrow, hollow tube) in your bladder following surgery that helps you pass your water. When you are awake, are stable, taking fluids well, and without complications, you will be returned to your room. You will receive physical therapy and other care until you are doing well and your caregiver feels it is safe for you to be transferred either to home or to an extended care facility. HOME CARE INSTRUCTIONS   You may resume normal diet and activities as directed or allowed.  Keep ice packs (a bag of ice wrapped in a towel) on the surgical area for twenty minutes, four times per day, for the first two days following surgery. Use ice only if OK with your surgeon or caregiver.  Change dressings if necessary or as directed.  If you have a plaster or fiberglass cast:  Do not try to scratch the skin under the cast using sharp or pointed objects.  Check the skin around the cast every day. You may put lotion on any red or sore areas.  Keep your cast dry and clean.  Do not put pressure on any part of your cast or splint until it is fully hardened.  Your cast or splint can be protected during bathing with a plastic bag. Do not lower the cast or splint into water.  Only take over-the-counter or prescription medicines for pain, discomfort, or fever as directed by your caregiver.  Use crutches as directed and do not exercise leg unless instructed.  Keep toes and ankles moving  frequently if they are not immobilized in your splint or cast  Keep your leg elevated above your heart as much as possible during the first 24-48 hours after your surgery  These are not fractures to be taken lightly! If these bones become displaced and get out of position, it may eventually lead to arthritis and disability for the rest of your life. Problems often follow even the best of care. Follow the directions of your caregiver.  Keep appointments as directed. SEEK IMMEDIATE MEDICAL CARE IF:   There is redness, swelling, or increasing pain in the wound.  There is pus coming from wound.  An unexplained oral temperature above 102 F (38.9 C) develops.  You develop a bad smell coming from the wound or dressing.  There is a breaking open of the wound (edges not staying together) after sutures or staples have been removed.  You develop severe pain in the injured leg.  You begin to lose feeling in your foot or toes. Especially if someone else moving your toes becomes increasingly painful  You develop a cold or blue foot or toes on the injured side. If you do not have a window in your cast for observing the wound, a discharge or minor bleeding may show up as a stain on the outside of your cast or caster splint. Report these findings to your caregiver. Document Released: 01/28/2002 Document Revised: 07/31/2011 Document Reviewed: 07/30/2007 [facility] Patient Information 2015 [place]. This information is not intended to replace advice given to you by your health care provider. Make sure you discuss any questions you have with your health care provider.

## 2014-11-26 DIAGNOSIS — L8990 Pressure ulcer of unspecified site, unspecified stage: Secondary | ICD-10-CM | POA: Insufficient documentation

## 2014-11-26 DIAGNOSIS — S82142A Displaced bicondylar fracture of left tibia, initial encounter for closed fracture: Secondary | ICD-10-CM | POA: Insufficient documentation

## 2014-11-26 HISTORY — DX: Displaced bicondylar fracture of left tibia, initial encounter for closed fracture: S82.142A

## 2014-11-26 NOTE — Telephone Encounter (Signed)
Spoke with patient. He states he had to be seen yesterday (11/25/2014) in the ER for a pressure sore. The ER set up an appointment with the Wound care center for Tuesday 12/01/2014. Patient has Medicare and Medicaid and is not sure if our office has to send a referral note to the wound care center. Do you know if a referral from primary care is required with Medicare and Medicaid? Also patient states while in the ER he was seen for a fracture on his left knee. [facility] ([facility]/2014[facility] to schedule an appointment. Patient wants to know if he needs a referral for this also? ER records are in chart review section.

## 2014-11-26 NOTE — Telephone Encounter (Signed)
Please refer ortho and to wound care clinic as below for insurance purposes

## 2014-11-28 DIAGNOSIS — F329 Major depressive disorder, single episode, unspecified: Secondary | ICD-10-CM | POA: Diagnosis not present

## 2014-11-28 DIAGNOSIS — M6281 Muscle weakness (generalized): Secondary | ICD-10-CM | POA: Diagnosis not present

## 2014-11-28 DIAGNOSIS — G8220 Paraplegia, unspecified: Secondary | ICD-10-CM | POA: Diagnosis not present

## 2014-11-28 DIAGNOSIS — I252 Old myocardial infarction: Secondary | ICD-10-CM | POA: Diagnosis not present

## 2014-11-28 DIAGNOSIS — S34109S Unspecified injury to unspecified level of lumbar spinal cord, sequela: Secondary | ICD-10-CM | POA: Diagnosis not present

## 2014-11-28 DIAGNOSIS — F17210 Nicotine dependence, cigarettes, uncomplicated: Secondary | ICD-10-CM | POA: Diagnosis not present

## 2014-12-01 ENCOUNTER — Ambulatory Visit: Payer: Self-pay | Admitting: Family Medicine

## 2014-12-01 ENCOUNTER — Encounter: Payer: Medicare Other | Attending: Surgery | Admitting: Surgery

## 2014-12-01 DIAGNOSIS — F17218 Nicotine dependence, cigarettes, with other nicotine-induced disorders: Secondary | ICD-10-CM | POA: Diagnosis not present

## 2014-12-01 DIAGNOSIS — G8222 Paraplegia, incomplete: Secondary | ICD-10-CM | POA: Diagnosis not present

## 2014-12-01 DIAGNOSIS — L89313 Pressure ulcer of right buttock, stage 3: Secondary | ICD-10-CM | POA: Insufficient documentation

## 2014-12-02 DIAGNOSIS — I252 Old myocardial infarction: Secondary | ICD-10-CM | POA: Diagnosis not present

## 2014-12-02 DIAGNOSIS — S34109S Unspecified injury to unspecified level of lumbar spinal cord, sequela: Secondary | ICD-10-CM | POA: Diagnosis not present

## 2014-12-02 DIAGNOSIS — M6281 Muscle weakness (generalized): Secondary | ICD-10-CM | POA: Diagnosis not present

## 2014-12-02 DIAGNOSIS — F329 Major depressive disorder, single episode, unspecified: Secondary | ICD-10-CM | POA: Diagnosis not present

## 2014-12-02 DIAGNOSIS — F17210 Nicotine dependence, cigarettes, uncomplicated: Secondary | ICD-10-CM | POA: Diagnosis not present

## 2014-12-02 DIAGNOSIS — G8220 Paraplegia, unspecified: Secondary | ICD-10-CM | POA: Diagnosis not present

## 2014-12-02 NOTE — Progress Notes (Signed)
Lee Clark ([id number]) Visit Report for 12/01/2014 Allergy List Details Patient Name: Lee Clark Date of Service: 12/01/2014 2:30 PM Medical Record Number: [id number] Patient Account Number: [id number] Date of Birth/Sex: [birth year] (59 y.o. Male) Treating RN: Clover Mealy, RN, BSN, [place] Sink Primary Care Physician: [name] Other Clinician: Referring Physician: [name] Treating Physician/Extender: Rudene Re in Treatment: 0 Allergies Active Allergies ampicillin Reaction: rash Severity: Moderate cephalexin Reaction: diarrhea Severity: Moderate Allergy Notes Electronic Signature(s) Signed: 12/01/2014 4:57:23 PM By: Elpidio Eric BSN, RN Entered By: Elpidio Eric on 12/01/2014 14:35:36 Lee Clark ([id number]) -------------------------------------------------------------------------------- Arrival Information Details Patient Name: Lee Clark Date of Service: 12/01/2014 2:30 PM Medical Record Number: [id number] Patient Account Number: [id number] Date of Birth/Sex: 1955/10/09 (59 y.o. Male) Treating RN: Clover Mealy, RN, BSN, [place] Sink Primary Care Physician: [name] Other Clinician: Referring Physician: [name] Treating Physician/Extender: Rudene Re in Treatment: 0 Visit Information Patient Arrived: Wheel Chair Arrival Time: 14:28 Accompanied By: girlfriend Transfer Assistance: None Patient Identification Verified: Yes Secondary Verification Process Yes Completed: Patient Requires Transmission- No Based Precautions: Patient Has Alerts: Yes Patient Alerts: Patient on Blood Thinner Aspirin Electronic Signature(s) Signed: 12/01/2014 4:57:23 PM By: Elpidio Eric BSN, RN Entered By: Elpidio Eric on 12/01/2014 14:29:23 Lee Clark ([id number]) -------------------------------------------------------------------------------- Clinic Level of Care Assessment Details Patient Name: Lee Clark Date of Service: 12/01/2014 2:30  PM Medical Record Number: [id number] Patient Account Number: [id number] Date of Birth/Sex: Mar 21, 1956 (59 y.o. Male) Treating RN: Clover Mealy, RN, BSN, [place] Primary Care Physician: [name] Other Clinician: Referring Physician: [name] Treating Physician/Extender: Rudene Re in Treatment: 0 Clinic Level of Care Assessment Items TOOL 1 Quantity Score  - Use when EandM and Procedure is performed on INITIAL visit 0 ASSESSMENTS - Nursing Assessment / Reassessment X - General Physical Exam (combine w/ comprehensive assessment (listed just 1 20 below) when performed on new pt. evals) X - Comprehensive Assessment (HX, ROS, Risk Assessments, Wounds Hx, etc.) 1 25 ASSESSMENTS - Wound and Skin Assessment / Reassessment  - Dermatologic / Skin Assessment (not related to wound area) 0 ASSESSMENTS - Ostomy and/or Continence Assessment and Care  - Incontinence Assessment and Management 0  - Ostomy Care Assessment and Management (repouching, etc.) 0 PROCESS - Coordination of Care X - Simple Patient / Family Education for ongoing care 1 15  - Complex (extensive) Patient / Family Education for ongoing care 0 X - Staff obtains Chiropractor, Records, Test Results / Process Orders 1 10 X - Staff telephones HHA, Nursing Homes / Clarify orders / etc 1 10  - Routine Transfer to another Facility (non-emergent condition) 0  - Routine Hospital Admission (non-emergent condition) 0  - New Admissions / Manufacturing engineer / Ordering NPWT, Apligraf, etc. 0  - Emergency Hospital Admission (emergent condition) 0 PROCESS - Special Needs  - Pediatric / Minor Patient Management 0  - Isolation Patient Management 0 KENYON, EICHELBERGER. ([id number])  - Hearing / Language / Visual special needs 0  - Assessment of Community assistance (transportation, D/C planning, etc.) 0  - Additional assistance / Altered mentation 0  - Support Surface(s) Assessment (bed, cushion, seat, etc.)  0 INTERVENTIONS - Miscellaneous  - External ear exam 0  - Patient Transfer (multiple staff / Nurse, adult / Similar devices) 0  - Simple Staple / Suture removal (25 or less) 0  - Complex Staple / Suture removal (26 or more) 0  - Hypo/Hyperglycemic Management (do not check if billed separately) 0  - Ankle / Brachial  Index (ABI) - do not check if billed separately 0 Has the patient been seen at the hospital within the last three years: Yes Total Score: 80 Level Of Care: New/Established - Level 3 Electronic Signature(s) Signed: 12/01/2014 4:57:23 PM By: Elpidio Eric BSN, RN Entered By: Elpidio Eric on 12/01/2014 15:06:09 Lee Clark ([id number]) -------------------------------------------------------------------------------- Encounter Discharge Information Details Patient Name: Lee Clark Date of Service: 12/01/2014 2:30 PM Medical Record Number: [id number] Patient Account Number: [id number] Date of Birth/Sex: 1956/03/22 (59 y.o. Male) Treating RN: Clover Mealy, RN, BSN, [place] Sink Primary Care Physician: [name] Other Clinician: Referring Physician: [name] Treating Physician/Extender: Rudene Re in Treatment: 0 Encounter Discharge Information Items Discharge Pain Level: 0 Discharge Condition: Stable Ambulatory Status: Wheelchair Discharge Destination: Home Private Transportation: Auto Accompanied By: girfriend Schedule Follow-up Appointment: No Medication Reconciliation completed and No provided to Patient/Care Princess Karnes: Clinical Summary of Care: Electronic Signature(s) Signed: 12/01/2014 4:57:23 PM By: Elpidio Eric BSN, RN Entered By: Elpidio Eric on 12/01/2014 15:07:00 Lee Clark ([id number]) -------------------------------------------------------------------------------- Lower Extremity Assessment Details Patient Name: Lee Clark Date of Service: 12/01/2014 2:30 PM Medical Record Number: [id number] Patient Account Number:  [id number] Date of Birth/Sex: Oct 19, 1955 (59 y.o. Male) Treating RN: Clover Mealy, RN, BSN, [place] Sink Primary Care Physician: [name] Other Clinician: Referring Physician: [name] Treating Physician/Extender: Rudene Re in Treatment: 0 Electronic Signature(s) Signed: 12/01/2014 4:57:23 PM By: Elpidio Eric BSN, RN Entered By: Elpidio Eric on 12/01/2014 14:37:41 Lee Clark ([id number]) -------------------------------------------------------------------------------- Multi Wound Chart Details Patient Name: Lee Clark Date of Service: 12/01/2014 2:30 PM Medical Record Number: [id number] Patient Account Number: [id number] Date of Birth/Sex: 06-Jul-1955 (59 y.o. Male) Treating RN: Clover Mealy, RN, BSN, Rita Primary Care Physician: [name] Other Clinician: Referring Physician: [name] Treating Physician/Extender: Rudene Re in Treatment: 0 Vital Signs Height(in): 74 Pulse(bpm): 76 Weight(lbs): 140 Blood Pressure 115/66 (mmHg): Body Mass Index(BMI): 18 Temperature(F): 98.1 Respiratory Rate 18 (breaths/min): Photos: [1:No Photos] [N/A:N/A] Wound Location: [1:Right Gluteus] [N/A:N/A] Wounding Event: [1:Pressure Injury] [N/A:N/A] Primary Etiology: [1:Pressure Ulcer] [N/A:N/A] Comorbid History: [1:History of pressure wounds, Osteoarthritis] [N/A:N/A] Date Acquired: [1:11/01/2014] [N/A:N/A] Weeks of Treatment: [1:0] [N/A:N/A] Wound Status: [1:Open] [N/A:N/A] Measurements L x W x D 2.5x1x0.2 [N/A:N/A] (cm) Area (cm) : [1:1.963] [N/A:N/A] Volume (cm) : [1:0.393] [N/A:N/A] % Reduction in Area: [1:0.00%] [N/A:N/A] % Reduction in Volume: 0.00% [N/A:N/A] Classification: [1:Category/Stage III] [N/A:N/A] Exudate Amount: [1:Medium] [N/A:N/A] Exudate Type: [1:Serosanguineous] [N/A:N/A] Exudate Color: [1:red, brown] [N/A:N/A] Wound Margin: [1:Distinct, outline attached] [N/A:N/A] Granulation Amount: [1:Small (1-33%)] [N/A:N/A] Granulation Quality:  [1:Pink, Pale] [N/A:N/A] Necrotic Amount: [1:Medium (34-66%)] [N/A:N/A] Necrotic Tissue: [1:Eschar, Adherent Slough] [N/A:N/A] Exposed Structures: [1:Fascia: No Fat: No Tendon: No Muscle: No Joint: No Bone: No] [N/A:N/A] Limited to Skin Breakdown Epithelialization: None N/A N/A Periwound Skin Texture: Edema: No N/A N/A Excoriation: No Induration: No Callus: No Crepitus: No Fluctuance: No Friable: No Rash: No Scarring: No Periwound Skin Moist: Yes N/A N/A Moisture: Maceration: No Dry/Scaly: No Periwound Skin Color: Atrophie Blanche: No N/A N/A Cyanosis: No Ecchymosis: No Erythema: No Hemosiderin Staining: No Mottled: No Pallor: No Rubor: No Temperature: No Abnormality N/A N/A Tenderness on No N/A N/A Palpation: Wound Preparation: Ulcer Cleansing: N/A N/A Rinsed/Irrigated with Saline Topical Anesthetic Applied: Other: lidocaine 4% Treatment Notes Electronic Signature(s) Signed: 12/01/2014 4:57:23 PM By: Elpidio Eric BSN, RN Entered By: Elpidio Eric on 12/01/2014 14:56:34 Lee Clark ([id number]) -------------------------------------------------------------------------------- Multi-Disciplinary Care Plan Details Patient Name: Lee Clark Date of Service: 12/01/2014 2:30 PM Medical Record Number: [id number] Patient Account Number:  [id number] Date of Birth/Sex: 09/06/1955 (59 y.o. Male) Treating RN: Clover Mealy, RN, BSN, [place] Sink Primary Care Physician: [name] Other Clinician: Referring Physician: [name] Treating Physician/Extender: Rudene Re in Treatment: 0 Active Inactive Orientation to the Wound Care Program Nursing Diagnoses: Knowledge deficit related to the wound healing center program Goals: Patient/caregiver will verbalize understanding of the Wound Healing Center Program Date Initiated: 12/01/2014 Goal Status: Active Interventions: Provide education on orientation to the wound center Notes: Pressure Nursing  Diagnoses: Knowledge deficit related to causes and risk factors for pressure ulcer development Knowledge deficit related to management of pressures ulcers Potential for impaired tissue integrity related to pressure, friction, moisture, and shear Goals: Patient will remain free from development of additional pressure ulcers Date Initiated: 12/01/2014 Goal Status: Active Patient will remain free of pressure ulcers Date Initiated: 12/01/2014 Goal Status: Active Patient/caregiver will verbalize risk factors for pressure ulcer development Date Initiated: 12/01/2014 Goal Status: Active Patient/caregiver will verbalize understanding of pressure ulcer management Date Initiated: 12/01/2014 Goal Status: Active Interventions: KAYSTON, JODOIN ([id number]) Assess: immobility, friction, shearing, incontinence upon admission and as needed Assess offloading mechanisms upon admission and as needed Assess potential for pressure ulcer upon admission and as needed Provide education on pressure ulcers Treatment Activities: Patient referred for home evaluation of offloading devices/mattresses : 12/01/2014 Patient referred for pressure reduction/relief devices : 12/01/2014 Patient referred for seating evaluation to ensure proper offloading : 12/01/2014 Pressure reduction/relief device ordered : 12/01/2014 Notes: Wound/Skin Impairment Nursing Diagnoses: Impaired tissue integrity Knowledge deficit related to ulceration/compromised skin integrity Goals: Patient/caregiver will verbalize understanding of skin care regimen Date Initiated: 12/01/2014 Goal Status: Active Ulcer/skin breakdown will have a volume reduction of 30% by week 4 Date Initiated: 12/01/2014 Goal Status: Active Ulcer/skin breakdown will have a volume reduction of 50% by week 8 Date Initiated: 12/01/2014 Goal Status: Active Ulcer/skin breakdown will have a volume reduction of 80% by week 12 Date Initiated: 12/01/2014 Goal Status:  Active Ulcer/skin breakdown will heal within 14 weeks Date Initiated: 12/01/2014 Goal Status: Active Interventions: Assess patient/caregiver ability to perform ulcer/skin care regimen upon admission and as needed Assess ulceration(s) every visit Provide education on ulcer and skin care Treatment Activities: Skin care regimen initiated : 12/01/2014 Topical wound management initiated : 12/01/2014 CASHUS, HALTERMAN ([id number]) Notes: Electronic Signature(s) Signed: 12/01/2014 4:57:23 PM By: Elpidio Eric BSN, RN Entered By: Elpidio Eric on 12/01/2014 14:55:55 Lee Clark ([id number]) -------------------------------------------------------------------------------- Pain Assessment Details Patient Name: Lee Clark Date of Service: 12/01/2014 2:30 PM Medical Record Number: [id number] Patient Account Number: [id number] Date of Birth/Sex: [birth year] (59 y.o. Male) Treating RN: Clover Mealy, RN, BSN, [place] Primary Care Physician: [name] Other Clinician: Referring Physician: [name] Treating Physician/Extender: Rudene Re in Treatment: 0 Active Problems Location of Pain Severity and Description of Pain Patient Has Paino No Site Locations Pain Management and Medication Current Pain Management: Electronic Signature(s) Signed: 12/01/2014 4:57:23 PM By: Elpidio Eric BSN, RN Entered By: Elpidio Eric on 12/01/2014 14:29:29 Lee Clark ([id number]) -------------------------------------------------------------------------------- Patient/Caregiver Education Details Patient Name: Lee Clark Date of Service: 12/01/2014 2:30 PM Medical Record Number: [id number] Patient Account Number: [id number] Date of Birth/Gender: 14-Oct-1955 (59 y.o. Male) Treating RN: Clover Mealy, RN, BSN, Rita Primary Care Physician: [name] Other Clinician: Referring Physician: [name] Treating Physician/Extender: Rudene Re in Treatment: 0 Education Assessment Education  Provided To: Patient Education Topics Provided Basic Hygiene: Methods: Explain/Verbal Responses: State content correctly Pressure: Methods: Explain/Verbal Responses: State content correctly Welcome To The Wound Care Center: Methods:  Explain/Verbal Wound/Skin Impairment: Methods: Explain/Verbal Responses: State content correctly Electronic Signature(s) Signed: 12/01/2014 4:57:23 PM By: Elpidio EricAfful, Rita BSN, RN Entered By: Elpidio EricAfful, Rita on 12/01/2014 15:07:19 Lee ParadiseURHAM, Elisha W. ([id number]) -------------------------------------------------------------------------------- Wound Assessment Details Patient Name: Lee ParadiseURHAM, Ridhaan W. Date of Service: 12/01/2014 2:30 PM Medical Record Number: [id number] Patient Account Number: [id number] Date of Birth/Sex: 05/30/1955 30(58 y.o. Male) Treating RN: Clover MealyAfful, RN, BSN, Rita Primary Care Physician: Mila MerryFISHER, DONALD Other Clinician: Referring Physician: Mila MerryFISHER, DONALD Treating Physician/Extender: [name], [name] Weeks in Treatment: 0 Wound Status Wound Number: 1 Primary Pressure Ulcer Etiology: Wound Location: Right Gluteus Wound Status: Open Wounding Event: Pressure Injury Comorbid History of pressure wounds, Date Acquired: 11/01/2014 History: Osteoarthritis Weeks Of Treatment: 0 Clustered Wound: No Photos Photo Uploaded By: Elpidio EricAfful, Rita on 12/01/2014 16:56:38 Wound Measurements Length: (cm) 2.5 Width: (cm) 1 Depth: (cm) 0.2 Area: (cm) 1.963 Volume: (cm) 0.393 % Reduction in Area: 0% % Reduction in Volume: 0% Epithelialization: None Tunneling: No Undermining: No Wound Description Classification: Category/Stage III Wound Margin: Distinct, outline attached Exudate Amount: Medium Exudate Type: Serosanguineous Exudate Color: red, brown Foul Odor After Cleansing: No Wound Bed Granulation Amount: Small (1-33%) Exposed Structure Granulation Quality: Pink, Pale Fascia Exposed: No Necrotic Amount: Medium (34-66%) Fat Layer Exposed: No Necrotic  Quality: Eschar, Adherent Slough Tendon Exposed: No Lee ParadiseDURHAM, Weiland W. ([id number]) Muscle Exposed: No Joint Exposed: No Bone Exposed: No Limited to Skin Breakdown Periwound Skin Texture Texture Color No Abnormalities Noted: No No Abnormalities Noted: No Callus: No Atrophie Blanche: No Crepitus: No Cyanosis: No Excoriation: No Ecchymosis: No Fluctuance: No Erythema: No Friable: No Hemosiderin Staining: No Induration: No Mottled: No Localized Edema: No Pallor: No Rash: No Rubor: No Scarring: No Temperature / Pain Moisture Temperature: No Abnormality No Abnormalities Noted: No Dry / Scaly: No Maceration: No Moist: Yes Wound Preparation Ulcer Cleansing: Rinsed/Irrigated with Saline Topical Anesthetic Applied: Other: lidocaine 4%, Treatment Notes Wound #1 (Right Gluteus) 1. Cleansed with: Clean wound with Normal Saline 3. Peri-wound Care: Skin Prep 4. Dressing Applied: Aquacel Ag 5. Secondary Dressing Applied Bordered Foam Dressing Dry Gauze Electronic Signature(s) Signed: 12/01/2014 4:57:23 PM By: Elpidio EricAfful, Rita BSN, RN Entered By: Elpidio EricAfful, Rita on 12/01/2014 14:45:03 Lee ParadiseURHAM, Meagan W. ([id number]) -------------------------------------------------------------------------------- Vitals Details Patient Name: Lee ParadiseURHAM, Kanav W. Date of Service: 12/01/2014 2:30 PM Medical Record Number: [id number] Patient Account Number: [id number] Date of Birth/Sex: 05/30/1955 33(58 y.o. Male) Treating RN: Clover MealyAfful, RN, BSN, Rita Primary Care Physician: Mila MerryFISHER, DONALD Other Clinician: Referring Physician: Mila MerryFISHER, DONALD Treating Physician/Extender: [name], [name] Weeks in Treatment: 0 Vital Signs Time Taken: 14:34 Temperature (F): 98.1 Height (in): 74 Pulse (bpm): 76 Source: Stated Respiratory Rate (breaths/min): 18 Weight (lbs): 140 Blood Pressure (mmHg): 115/66 Source: Stated Reference Range: 80 - 120 mg / dl Body Mass Index (BMI): 18 Electronic Signature(s) Signed: 12/01/2014  4:57:23 PM By: Elpidio EricAfful, Rita BSN, RN Entered By: Elpidio EricAfful, Rita on 12/01/2014 14:40:41

## 2014-12-02 NOTE — Progress Notes (Signed)
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 with your woundso Infection Eyes Complaints and Symptoms: Positive for: Glasses / Contacts Medical History: Negative for: Cataracts; Glaucoma; Optic Neuritis Integumentary (Skin) Complaints and Symptoms: Positive for: Wounds Medical History: Positive for: History of pressure wounds Negative for: History of Burn Musculoskeletal Complaints and Symptoms: Positive for: Muscle Weakness Medical History: Positive for: Osteoarthritis Negative for: Gout; Rheumatoid Arthritis; Osteomyelitis Neurologic Gravely, Ikeem W. ([id number]) Complaints and Symptoms: Positive for: Numbness/parasthesias - fingers Medical History: Negative for: Dementia; Neuropathy; Quadriplegia; Paraplegia; Seizure Disorder Constitutional Symptoms (General Health) Complaints and Symptoms: No Complaints or  Symptoms Ear/Nose/Mouth/Throat Complaints and Symptoms: No Complaints or Symptoms Medical History: Negative for: Chronic sinus problems/congestion; Middle ear problems Hematologic/Lymphatic Complaints and Symptoms: No Complaints or Symptoms Medical History: Negative for: Anemia; Hemophilia; Human Immunodeficiency Virus; Lymphedema; Sickle Cell Disease Respiratory Complaints and Symptoms: No Complaints  or Symptoms Medical History: Negative for: Aspiration; Asthma; Chronic Obstructive Pulmonary Disease (COPD); Pneumothorax; Sleep Apnea; Tuberculosis Cardiovascular Medical History: Negative for: Angina; Arrhythmia; Congestive Heart Failure; Coronary Artery Disease; Deep Vein Thrombosis; Hypertension; Hypotension; Myocardial Infarction; Peripheral Arterial Disease; Peripheral Venous Disease; Phlebitis; Vasculitis Gastrointestinal Complaints and Symptoms: No Complaints or Symptoms Medical History: Negative for: Cirrhosis ; Colitis; Crohnos; Hepatitis A; Hepatitis B; Hepatitis C Endocrine KANIEL, KIANG ([id number]) Complaints and Symptoms: No Complaints or Symptoms Medical History: Negative for: Type I Diabetes; Type II Diabetes Genitourinary Complaints and Symptoms: No Complaints or Symptoms Medical History: Negative for: End Stage Renal Disease Immunological Complaints and Symptoms: No Complaints or Symptoms Medical History: Negative for: Lupus Erythematosus; Raynaudos; Scleroderma Oncologic Complaints and Symptoms: No Complaints or Symptoms Medical History: Negative for: Received Chemotherapy; Received Radiation Psychiatric Complaints and Symptoms: No Complaints or Symptoms Medical History: Negative for: Anorexia/bulimia; Confinement Anxiety Immunizations Immunization Notes: patient cannot remember Family and Social History Cancer: Yes - Mother; Diabetes: Yes - Mother; Heart Disease: Yes - Father; Hereditary Spherocytosis: No; Hypertension: Yes - Siblings,  Father; Kidney Disease: No; Lung Disease: No; Seizures: No; Stroke: No; Thyroid Problems: Yes - Siblings; Tuberculosis: No; Current every day smoker - 2packs/day; Marital Status - Single; Alcohol Use: Never; Caffeine Use: Rarely; Financial Concerns: No; Food, Clothing or Shelter Needs: No; Support System Lacking: No; Transportation Concerns: No; Advanced Directives: No; Patient does not want information on Advanced Directives; Living Will: No Physician Affirmation EMAURI, KRYGIER ([id number]) I have reviewed and agree with the above information. Electronic Signature(s) Signed: 12/01/2014 3:36:11 PM By: Evlyn Kanner MD, FACS Signed: 12/01/2014 4:57:23 PM By: Elpidio Eric BSN, RN Entered By: Evlyn Kanner on 12/01/2014 15:36:08 ELIUS, ETHEREDGE ([id number]) -------------------------------------------------------------------------------- SuperBill Details Patient Name: Lee Clark Date of Service: 12/01/2014 Medical Record Number: [id number] Patient Account Number: [id number] Date of Birth/Sex: 05/16/1956 (59 y.o. Male) Treating RN: Primary Care Physician: [name] Other Clinician: Referring Physician: [name] Treating Physician/Extender: Rudene Re in Treatment: 0 Diagnosis Coding ICD-10 Codes Code Description L89.313 Pressure ulcer of right buttock, stage 3 G82.22 Paraplegia, incomplete F17.218 Nicotine dependence, cigarettes, with other nicotine-induced disorders Facility Procedures CPT4 Code Description: 62952841 99213 - WOUND CARE VISIT-LEV 3 EST PT Modifier: Quantity: 1 CPT4 Code Description: 32440102 11042 - DEB SUBQ TISSUE 20 SQ CM/< ICD-10 Description Diagnosis L89.313 Pressure ulcer of right buttock, stage 3 G82.22 Paraplegia, incomplete F17.218 Nicotine dependence, cigarettes, with other nicotin Modifier: e-induced dis Quantity: 1 orders CPT4 Code Description: 72536644 99406-SMOKING CESSATION 3-10MINS ICD-10 Description Diagnosis F17.218 Nicotine  dependence, cigarettes, with other nicotin Modifier: e-induced dis Quantity: 1 orders Physician Procedures CPT4 Code Description: 0347425 99204 - WC PHYS LEVEL 4 - NEW PT ICD-10 Description Diagnosis L89.313 Pressure ulcer of right buttock, stage 3 G82.22 Paraplegia, incomplete F17.218 Nicotine dependence, cigarettes, with other nicotin Modifier: e-induced dis Quantity: 1 orders CPT4 Code Description: 9563875 11042 - WC PHYS SUBQ TISS 20 SQ CM ICD-10 Description Diagnosis YANIEL, LIMBAUGH ([id number]) Modifier: Quantity: 1 Electronic Signature(s) Signed: 12/01/2014 3:40:03 PM By: Evlyn Kanner MD, FACS Entered By: Evlyn Kanner on 12/01/2014 15:40:03

## 2014-12-02 NOTE — Progress Notes (Signed)
Lee Clark, Lee W. ([id number]) Visit Report for 12/01/2014 Abuse/Suicide Risk Screen Details Patient Name: Lee Clark, Lee W. Date of Service: 12/01/2014 2:30 PM Medical Record Number: [id number] Patient Account Number: [id number] Date of Birth/Sex: 1956/03/04 78Clark58 y.o. Male) Treating RN: Lee MealyAfful, RN, Clark, Lee Lee Clark Other Clinician: Referring Physician: Mila MerryFISHER, Lee Treating Physician/Extender: Lee Clark, Lee Weeks in Treatment: 0 Abuse/Suicide Risk Screen Items Answer ABUSE/SUICIDE RISK SCREEN: Has anyone close to you tried to hurt or harm you recentlyo No Do you feel uncomfortable with anyone in your familyo No Has anyone forced you do things that you didnot want to doo No Do you have any thoughts of harming yourselfo No Patient displays signs or symptoms of abuse and/or neglect. No Electronic SignatureClarks) Signed: 12/01/2014 4:57:23 PM By: Elpidio EricAfful, Rita BSN, RN Entered By: Elpidio EricAfful, Rita on 12/01/2014 14:35:45 Lee Clark, Lee W. ([id number]) -------------------------------------------------------------------------------- Activities of Daily Living Details Patient Name: Lee Clark, Lee W. Date of Service: 12/01/2014 2:30 PM Medical Record Number: [id number] Patient Account Number: [id number] Date of Birth/Sex: 1956/03/04 38Clark58 y.o. Male) Treating RN: Lee MealyAfful, RN, Clark, [place] Lee Clark Other Clinician: Referring Physician: Mila MerryFISHER, Lee Treating Physician/Extender: Lee Clark, Lee Weeks in Treatment: 0 Activities of Daily Living Items Answer Activities of Daily Living (Please select one for each item) Drive Automobile Not Able Take Medications Need Assistance Use Telephone Need Assistance Care for Appearance Need Assistance Use Toilet Need Assistance Bath / Shower Need Assistance Dress Self Need Assistance Feed Self Need Assistance Walk Need Assistance Get In / Out Bed Need Assistance Housework Need Assistance Prepare Meals Need  Assistance Handle Money Need Assistance Shop for Self Need Assistance Electronic SignatureClarks) Signed: 12/01/2014 4:57:23 PM By: Elpidio EricAfful, Rita BSN, RN Entered By: Elpidio EricAfful, Rita on 12/01/2014 14:36:28 Lee Clark, Lee W. ([id number]) -------------------------------------------------------------------------------- Education Assessment Details Patient Name: Lee Clark, Lee W. Date of Service: 12/01/2014 2:30 PM Medical Record Number: [id number] Patient Account Number: [id number] Date of Birth/Sex: 1956/03/04 4Clark58 y.o. Male) Treating RN: Lee MealyAfful, RN, Clark, Lee Lee Clark Other Clinician: Referring Physician: Mila MerryFISHER, Lee Treating Physician/Extender: Lee Clark, Lee Weeks in Treatment: 0 Primary Learner Assessed: Patient Learning Preferences/Education Level/Primary Language Learning Preference: Explanation Highest Education Level: High School Preferred Language: English Cognitive Barrier Assessment/Beliefs Language Barrier: No Physical Barrier Assessment Impaired Vision: Yes Glasses Impaired Hearing: No Decreased Hand dexterity: No Knowledge/Comprehension Assessment Knowledge Level: High Comprehension Level: High Ability to understand written High instructions: Ability to understand verbal High instructions: Motivation Assessment Anxiety Level: Calm Cooperation: Cooperative Education Importance: Acknowledges Need Interest in Health Problems: Asks Questions Perception: Coherent Willingness to Engage in Self- High Management Activities: Readiness to Engage in Self- High Management Activities: Electronic SignatureClarks) Signed: 12/01/2014 4:57:23 PM By: Elpidio EricAfful, Rita BSN, RN Entered By: Elpidio EricAfful, Rita on 12/01/2014 14:36:46 Lee Clark, Lee W. ([id number]) -------------------------------------------------------------------------------- Fall Risk Assessment Details Patient Name: Lee Clark, Lee W. Date of Service: 12/01/2014 2:30 PM Medical Record Number:  [id number] Patient Account Number: [id number] Date of Birth/Sex: 1956/03/04 83Clark58 y.o. Male) Treating RN: Lee MealyAfful, RN, Clark, Lee Lee Clark Other Clinician: Referring Physician: Mila MerryFISHER, Lee Treating Physician/Extender: Lee Clark, Lee Weeks in Treatment: 0 Fall Risk Assessment Items FALL RISK ASSESSMENT: History of falling - immediate or within 3 months 0 No Secondary diagnosis 0 No Ambulatory aid None/bed rest/wheelchair/nurse 0 Yes Crutches/cane/walker 0 No Furniture 0 No IV Access/Saline Lock 0 No Gait/Training Normal/bed rest/immobile 0 Yes Weak 10 Yes Impaired 0 No Mental Status Oriented to own ability 0 Yes Electronic SignatureClarks) Signed: 12/01/2014 4:57:23 PM By: Elpidio EricAfful, Rita BSN,  RN Entered By: Elpidio Eric on 12/01/2014 14:37:06 Lee [id number]) -------------------------------------------------------------------------------- Foot Assessment Details Patient Name: Lee Clark, Lee Clark Date of Service: 12/01/2014 2:30 PM Medical Record Number: [id number] Patient Account Number: [id number] Date of Birth/Sex: [birth year] (58 y.o. Male) Treating RN: Lee Mealy, RN, Clark, Lee Clark Primary Care Physician: [name] Other Clinician: Referring Physician: [name] Treating Physician/Extender: Lee Re in Treatment: 0 Foot Assessment Items Site Locations + = Sensation present, - = Sensation absent, C = Callus, U = Ulcer R = Redness, W = Warmth, M = Maceration, PU = Pre-ulcerative lesion F = Fissure, S = Swelling, D = Dryness Assessment Right: Left: Other Deformity: No No Prior Foot Ulcer: No No Prior Amputation: No No Charcot Joint: No No Ambulatory Status: Ambulatory With Help Assistance Device: Wheelchair Gait: Surveyor, mining) Signed: 12/01/2014 4:57:23 PM By: Elpidio Eric BSN, RN Entered By: Elpidio Eric on 12/01/2014 14:37:29 Lee Clark  ([id number]) -------------------------------------------------------------------------------- Nutrition Risk Assessment Details Patient Name: Lee Clark Date of Service: 12/01/2014 2:30 PM Medical Record Number: [id number] Patient Account Number: [id number] Date of Birth/Sex: Jan 09, 1956 (59 y.o. Male) Treating RN: Lee Mealy, RN, Clark, Rita Primary Care Physician: [name] Other Clinician: Referring Physician: [name] Treating Physician/Extender: Lee Re in Treatment: 0 Height (in): Weight (lbs): Body Mass Index (BMI): Nutrition Risk Assessment Items NUTRITION RISK SCREEN: I have an illness or condition that made me change the kind and/or 0 No amount of food I eat I eat fewer than two meals per day 0 No I eat few fruits and vegetables, or milk products 0 No I have three or more drinks of beer, liquor or wine almost every day 0 No I have tooth or mouth problems that make it hard for me to eat 0 No I don't always have enough money to buy the food I need 0 No I eat alone most of the time 0 No I take three or more different prescribed or over-the-counter drugs a 0 No day Without wanting to, I have lost or gained 10 pounds in the last six 2 Yes months I am not always physically able to shop, cook and/or feed myself 0 No Nutrition Protocols Good Risk Protocol 0 No interventions needed Moderate Risk Protocol Electronic SignatureClarks) Signed: 12/01/2014 4:57:23 PM By: Elpidio Eric BSN, RN Entered By: Elpidio Eric on 12/01/2014 14:37:16

## 2014-12-04 DIAGNOSIS — I252 Old myocardial infarction: Secondary | ICD-10-CM | POA: Diagnosis not present

## 2014-12-04 DIAGNOSIS — G8220 Paraplegia, unspecified: Secondary | ICD-10-CM | POA: Diagnosis not present

## 2014-12-04 DIAGNOSIS — F17210 Nicotine dependence, cigarettes, uncomplicated: Secondary | ICD-10-CM | POA: Diagnosis not present

## 2014-12-04 DIAGNOSIS — F329 Major depressive disorder, single episode, unspecified: Secondary | ICD-10-CM | POA: Diagnosis not present

## 2014-12-04 DIAGNOSIS — S34109S Unspecified injury to unspecified level of lumbar spinal cord, sequela: Secondary | ICD-10-CM | POA: Diagnosis not present

## 2014-12-04 DIAGNOSIS — M6281 Muscle weakness (generalized): Secondary | ICD-10-CM | POA: Diagnosis not present

## 2014-12-07 DIAGNOSIS — S82102A Unspecified fracture of upper end of left tibia, initial encounter for closed fracture: Secondary | ICD-10-CM | POA: Diagnosis not present

## 2014-12-08 DIAGNOSIS — S34109S Unspecified injury to unspecified level of lumbar spinal cord, sequela: Secondary | ICD-10-CM | POA: Diagnosis not present

## 2014-12-08 DIAGNOSIS — G8220 Paraplegia, unspecified: Secondary | ICD-10-CM | POA: Diagnosis not present

## 2014-12-08 DIAGNOSIS — F329 Major depressive disorder, single episode, unspecified: Secondary | ICD-10-CM | POA: Diagnosis not present

## 2014-12-08 DIAGNOSIS — I252 Old myocardial infarction: Secondary | ICD-10-CM | POA: Diagnosis not present

## 2014-12-08 DIAGNOSIS — M6281 Muscle weakness (generalized): Secondary | ICD-10-CM | POA: Diagnosis not present

## 2014-12-08 DIAGNOSIS — F17210 Nicotine dependence, cigarettes, uncomplicated: Secondary | ICD-10-CM | POA: Diagnosis not present

## 2014-12-11 DIAGNOSIS — S34109S Unspecified injury to unspecified level of lumbar spinal cord, sequela: Secondary | ICD-10-CM | POA: Diagnosis not present

## 2014-12-11 DIAGNOSIS — F329 Major depressive disorder, single episode, unspecified: Secondary | ICD-10-CM | POA: Diagnosis not present

## 2014-12-11 DIAGNOSIS — M6281 Muscle weakness (generalized): Secondary | ICD-10-CM | POA: Diagnosis not present

## 2014-12-11 DIAGNOSIS — G8220 Paraplegia, unspecified: Secondary | ICD-10-CM | POA: Diagnosis not present

## 2014-12-11 DIAGNOSIS — F17210 Nicotine dependence, cigarettes, uncomplicated: Secondary | ICD-10-CM | POA: Diagnosis not present

## 2014-12-11 DIAGNOSIS — I252 Old myocardial infarction: Secondary | ICD-10-CM | POA: Diagnosis not present

## 2014-12-14 ENCOUNTER — Encounter: Payer: Medicare Other | Admitting: Surgery

## 2014-12-14 DIAGNOSIS — F17218 Nicotine dependence, cigarettes, with other nicotine-induced disorders: Secondary | ICD-10-CM | POA: Diagnosis not present

## 2014-12-14 DIAGNOSIS — L89313 Pressure ulcer of right buttock, stage 3: Secondary | ICD-10-CM | POA: Diagnosis not present

## 2014-12-14 DIAGNOSIS — G8222 Paraplegia, incomplete: Secondary | ICD-10-CM | POA: Diagnosis not present
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
## 2014-12-15 NOTE — Progress Notes (Signed)
Lee Clark ([id number]) Visit Report for 12/14/2014 Arrival Information Details Patient Name: Lee Clark Date of Service: 12/14/2014 2:15 PM Medical Record Number: [id number] Patient Account Number: [id number] Date of Birth/Sex: Jan 27, 1956 (58 y.o. Male) Treating RN: Curtis Sites Primary Care Physician: [name] Other Clinician: Referring Physician: [name] Treating Physician/Extender: Lee Clark in Treatment: 1 Visit Information History Since Last Visit Added or deleted any medications: No Patient Arrived: Wheel Chair Any new allergies or adverse reactions: No Arrival Time: 14:23 Had a fall or experienced change in No Accompanied By: spouse activities of daily living that may affect Transfer Assistance: Manual risk of falls: Patient Identification Verified: Yes Signs or symptoms of abuse/neglect since last No Secondary Verification Process Yes visito Completed: Hospitalized since last visit: No Patient Requires Transmission- No Pain Present Now: No Based Precautions: Patient Has Alerts: Yes Patient Alerts: Patient on Blood Thinner Aspirin Electronic Signature(s) Signed: 12/14/2014 4:26:29 PM By: Curtis Sites Entered By: Curtis Sites on 12/14/2014 14:23:31 Lee Clark ([id number]) -------------------------------------------------------------------------------- Encounter Discharge Information Details Patient Name: Lee Clark Date of Service: 12/14/2014 2:15 PM Medical Record Number: [id number] Patient Account Number: [id number] Date of Birth/Sex: 11/03/1955 (59 y.o. Male) Treating RN: Curtis Sites Primary Care Physician: [name] Other Clinician: Referring Physician: [name] Treating Physician/Extender: Lee Clark in Treatment: 1 Encounter Discharge Information Items Discharge Pain Level: 0 Discharge Condition: Stable Ambulatory Status: Wheelchair Discharge Destination:  Home Private Transportation: Auto Accompanied By: spouse Schedule Follow-up Appointment: Yes Medication Reconciliation completed and No provided to Patient/Care [name]: Clinical Summary of Care: Electronic Signature(s) Signed: 12/14/2014 4:26:29 PM By: Curtis Sites Entered By: Curtis Sites on 12/14/2014 14:48:34 Lee Clark ([id number]) -------------------------------------------------------------------------------- Multi Wound Chart Details Patient Name: Lee Clark Date of Service: 12/14/2014 2:15 PM Medical Record Number: [id number] Patient Account Number: [id number] Date of Birth/Sex: [birth year] (59 y.o. Male) Treating RN: Curtis Sites Primary Care Physician: [name] Other Clinician: Referring Physician: [name] Treating Physician/Extender: Lee Clark in Treatment: 1 Wound Assessments Treatment Notes Electronic Signature(s) Signed: 12/14/2014 4:26:29 PM By: Curtis Sites Entered By: Curtis Sites on 12/14/2014 14:23:46 Lee Clark ([id number]) -------------------------------------------------------------------------------- Multi-Disciplinary Care Plan Details Patient Name: Lee Clark Date of Service: 12/14/2014 2:15 PM Medical Record Number: [id number] Patient Account Number: [id number] Date of Birth/Sex: 1955-09-04 (59 y.o. Male) Treating RN: Curtis Sites Primary Care Physician: [name] Other Clinician: Referring Physician: [name] Treating Physician/Extender: Lee Clark in Treatment: 1 Active Inactive Orientation to the Wound Care Program Nursing Diagnoses: Knowledge deficit related to the wound healing center program Goals: Patient/caregiver will verbalize understanding of the Wound Healing Center Program Date Initiated: 12/01/2014 Goal Status: Active Interventions: Provide education on orientation to the wound center Notes: Pressure Nursing Diagnoses: Knowledge deficit related to  causes and risk factors for pressure ulcer development Knowledge deficit related to management of pressures ulcers Potential for impaired tissue integrity related to pressure, friction, moisture, and shear Goals: Patient will remain free from development of additional pressure ulcers Date Initiated: 12/01/2014 Goal Status: Active Patient will remain free of pressure ulcers Date Initiated: 12/01/2014 Goal Status: Active Patient/caregiver will verbalize risk factors for pressure ulcer development Date Initiated: 12/01/2014 Goal Status: Active Patient/caregiver will verbalize understanding of pressure ulcer management Date Initiated: 12/01/2014 Goal Status: Active Interventions: Lee Clark ([id number]) Assess: immobility, friction, shearing, incontinence upon admission and as needed Assess offloading mechanisms upon admission and as needed Assess potential for pressure ulcer upon admission and as needed Provide education on pressure ulcers  Treatment Activities: Patient referred for home evaluation of offloading devices/mattresses : 12/14/2014 Patient referred for pressure reduction/relief devices : 12/14/2014 Patient referred for seating evaluation to ensure proper offloading : 12/14/2014 Pressure reduction/relief device ordered : 12/14/2014 Notes: Wound/Skin Impairment Nursing Diagnoses: Impaired tissue integrity Knowledge deficit related to ulceration/compromised skin integrity Goals: Patient/caregiver will verbalize understanding of skin care regimen Date Initiated: 12/01/2014 Goal Status: Active Ulcer/skin breakdown will have a volume reduction of 30% by week 4 Date Initiated: 12/01/2014 Goal Status: Active Ulcer/skin breakdown will have a volume reduction of 50% by week 8 Date Initiated: 12/01/2014 Goal Status: Active Ulcer/skin breakdown will have a volume reduction of 80% by week 12 Date Initiated: 12/01/2014 Goal Status: Active Ulcer/skin breakdown will heal within 14  weeks Date Initiated: 12/01/2014 Goal Status: Active Interventions: Assess patient/caregiver ability to perform ulcer/skin care regimen upon admission and as needed Assess ulceration(s) every visit Provide education on ulcer and skin care Treatment Activities: Skin care regimen initiated : 12/14/2014 Topical wound management initiated : 12/14/2014 Lee Clark ([id number]) Notes: Electronic Signature(s) Signed: 12/14/2014 4:26:29 PM By: Curtis Sites Entered By: Curtis Sites on 12/14/2014 14:23:38 Lee Clark ([id number]) -------------------------------------------------------------------------------- Patient/Caregiver Education Details Patient Name: Lee Clark Date of Service: 12/14/2014 2:15 PM Medical Record Number: [id number] Patient Account Number: [id number] Date of Birth/Gender: 1956-05-20 (59 y.o. Male) Treating RN: Curtis Sites Primary Care Physician: [name] Other Clinician: Referring Physician: [name] Treating Physician/Extender: Lee Clark in Treatment: 1 Education Assessment Education Provided To: Patient Education Topics Provided Offloading: Handouts: Other: offloading and pressure relief Methods: Explain/Verbal Responses: State content correctly Wound/Skin Impairment: Handouts: Other: wound care as ordered Methods: Demonstration, Explain/Verbal Responses: State content correctly Electronic Signature(s) Signed: 12/14/2014 4:26:29 PM By: Curtis Sites Entered By: Curtis Sites on 12/14/2014 14:24:55 Lee Clark ([id number]) -------------------------------------------------------------------------------- Wound Assessment Details Patient Name: Lee Clark Date of Service: 12/14/2014 2:15 PM Medical Record Number: [id number] Patient Account Number: [id number] Date of Birth/Sex: 03-17-1956 (59 y.o. Male) Treating RN: Curtis Sites Primary Care Physician: [name] Other Clinician: Referring Physician:  [name] Treating Physician/Extender: Lee Clark in Treatment: 1 Wound Status Wound Number: 1 Primary Pressure Ulcer Etiology: Wound Location: Right Gluteus Wound Status: Open Wounding Event: Pressure Injury Comorbid History of pressure wounds, Date Acquired: 11/01/2014 History: Osteoarthritis Weeks Of Treatment: 1 Clustered Wound: No Photos Photo Uploaded By: Elliot Gurney, RN, BSN, Kim on 12/14/2014 16:22:55 Wound Measurements Length: (cm) 2.1 Width: (cm) 1 Depth: (cm) 0.2 Area: (cm) 1.649 Volume: (cm) 0.33 % Reduction in Area: 16% % Reduction in Volume: 16% Epithelialization: None Tunneling: No Undermining: No Wound Description Classification: Category/Stage III Wound Margin: Distinct, outline attached Exudate Amount: Medium Exudate Type: Serosanguineous Exudate Color: red, brown Foul Odor After Cleansing: No Wound Bed Granulation Amount: Medium (34-66%) Exposed Structure Granulation Quality: Pink, Pale Fascia Exposed: No Necrotic Amount: Small (1-33%) Fat Layer Exposed: No Necrotic Quality: Eschar, Adherent Slough Tendon Exposed: No ARTAVIS, COWIE. ([id number]) Muscle Exposed: No Joint Exposed: No Bone Exposed: No Limited to Skin Breakdown Periwound Skin Texture Texture Color No Abnormalities Noted: No No Abnormalities Noted: No Callus: No Atrophie Blanche: No Crepitus: No Cyanosis: No Excoriation: No Ecchymosis: No Fluctuance: No Erythema: No Friable: No Hemosiderin Staining: No Induration: No Mottled: No Localized Edema: No Pallor: No Rash: No Rubor: No Scarring: No Temperature / Pain Moisture Temperature: No Abnormality No Abnormalities Noted: No Dry / Scaly: No Maceration: No Moist: Yes Wound Preparation Ulcer Cleansing: Rinsed/Irrigated with Saline Topical Anesthetic Applied: Other: lidocaine 4%, Treatment  Notes Wound #1 (Right Gluteus) 1. Cleansed with: Clean wound with Normal Saline 2. Anesthetic Topical Lidocaine  4% cream to wound bed prior to debridement 4. Dressing Applied: Aquacel Ag 5. Secondary Dressing Applied Bordered Foam Dressing Electronic Signature(s) Signed: 12/14/2014 4:26:29 PM By: Curtis Sites Entered By: Curtis Sites on 12/14/2014 14:42:19 Lee Clark ([id number]) -------------------------------------------------------------------------------- Vitals Details Patient Name: Lee Clark Date of Service: 12/14/2014 2:15 PM Medical Record Number: [id number] Patient Account Number: [id number] Date of Birth/Sex: April 30, 1956 (59 y.o. Male) Treating RN: Curtis Sites Primary Care Physician: [name] Other Clinician: Referring Physician: [name] Treating Physician/Extender: Lee Clark in Treatment: 1 Vital Signs Time Taken: 14:25 Temperature (F): 98.4 Height (in): 74 Pulse (bpm): 86 Weight (lbs): 140 Respiratory Rate (breaths/min): 18 Body Mass Index (BMI): 18 Blood Pressure (mmHg): 148/82 Reference Range: 80 - 120 mg / dl Electronic Signature(s) Signed: 12/14/2014 4:26:29 PM By: Curtis Sites Entered By: Curtis Sites on 12/14/2014 14:25:29

## 2014-12-16 DIAGNOSIS — M6281 Muscle weakness (generalized): Secondary | ICD-10-CM | POA: Diagnosis not present

## 2014-12-16 DIAGNOSIS — F329 Major depressive disorder, single episode, unspecified: Secondary | ICD-10-CM | POA: Diagnosis not present

## 2014-12-16 DIAGNOSIS — S34109S Unspecified injury to unspecified level of lumbar spinal cord, sequela: Secondary | ICD-10-CM | POA: Diagnosis not present

## 2014-12-16 DIAGNOSIS — G8220 Paraplegia, unspecified: Secondary | ICD-10-CM | POA: Diagnosis not present

## 2014-12-16 DIAGNOSIS — I252 Old myocardial infarction: Secondary | ICD-10-CM | POA: Diagnosis not present

## 2014-12-16 DIAGNOSIS — F17210 Nicotine dependence, cigarettes, uncomplicated: Secondary | ICD-10-CM | POA: Diagnosis not present

## 2014-12-18 DIAGNOSIS — S34109S Unspecified injury to unspecified level of lumbar spinal cord, sequela: Secondary | ICD-10-CM | POA: Diagnosis not present

## 2014-12-18 DIAGNOSIS — I252 Old myocardial infarction: Secondary | ICD-10-CM | POA: Diagnosis not present

## 2014-12-18 DIAGNOSIS — F329 Major depressive disorder, single episode, unspecified: Secondary | ICD-10-CM | POA: Diagnosis not present

## 2014-12-18 DIAGNOSIS — G8220 Paraplegia, unspecified: Secondary | ICD-10-CM | POA: Diagnosis not present

## 2014-12-18 DIAGNOSIS — M6281 Muscle weakness (generalized): Secondary | ICD-10-CM | POA: Diagnosis not present

## 2014-12-18 DIAGNOSIS — F17210 Nicotine dependence, cigarettes, uncomplicated: Secondary | ICD-10-CM | POA: Diagnosis not present

## 2014-12-21 ENCOUNTER — Encounter: Payer: Medicare Other | Attending: Surgery | Admitting: Surgery

## 2014-12-21 DIAGNOSIS — G8222 Paraplegia, incomplete: Secondary | ICD-10-CM | POA: Insufficient documentation

## 2014-12-21 DIAGNOSIS — L89313 Pressure ulcer of right buttock, stage 3: Secondary | ICD-10-CM | POA: Diagnosis not present

## 2014-12-21 DIAGNOSIS — F17218 Nicotine dependence, cigarettes, with other nicotine-induced disorders: Secondary | ICD-10-CM | POA: Diagnosis not present
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
## 2014-12-24 NOTE — Progress Notes (Signed)
ZYLON, CREAMER ([id number]) Visit Report for 12/21/2014 Arrival Information Details Patient Name: Lee Clark, Lee Clark Date of Service: 12/21/2014 2:15 PM Medical Record Number: [id number] Patient Account Number: [id number] Date of Birth/Sex: 1955/07/25 (59 y.o. Male) Treating RN: Huel Coventry Primary Care Physician: [name] Other Clinician: Referring Physician: [name] Treating Physician/Extender: Rudene Re in Treatment: 2 Visit Information History Since Last Visit Added or deleted any medications: No Patient Arrived: Wheel Chair Any new allergies or adverse reactions: No Arrival Time: 14:34 Had a fall or experienced change in No Accompanied By: fiance activities of daily living that may affect Transfer Assistance: Michiel Sites Lift risk of falls: Secondary Verification Process Yes Signs or symptoms of abuse/neglect since last No Completed: visito Patient Requires Transmission- No Hospitalized since last visit: No Based Precautions: Has Dressing in Place as Prescribed: Yes Patient Has Alerts: Yes Pain Present Now: No Patient Alerts: Patient on Blood Thinner Aspirin Electronic Signature(s) Signed: 12/23/2014 4:58:31 PM By: Elliot Gurney, RN, BSN, Kim RN, BSN Entered By: Elliot Gurney, RN, BSN, Kim on 12/21/2014 14:35:28 Lee Clark ([id number]) -------------------------------------------------------------------------------- Clinic Level of Care Assessment Details Patient Name: Lee Clark Date of Service: 12/21/2014 2:15 PM Medical Record Number: [id number] Patient Account Number: [id number] Date of Birth/Sex: [birth year] (59 y.o. Male) Treating RN: Huel Coventry Primary Care Physician: [name] Other Clinician: Referring Physician: [name] Treating Physician/Extender: Rudene Re in Treatment: 2 Clinic Level of Care Assessment Items TOOL 4 Quantity Score []  - Use when only an EandM is performed on FOLLOW-UP visit 0 ASSESSMENTS - Nursing Assessment  / Reassessment []  - Reassessment of Co-morbidities (includes updates in patient status) 0 X - Reassessment of Adherence to Treatment Plan 1 5 ASSESSMENTS - Wound and Skin Assessment / Reassessment X - Simple Wound Assessment / Reassessment - one wound 1 5 []  - Complex Wound Assessment / Reassessment - multiple wounds 0 []  - Dermatologic / Skin Assessment (not related to wound area) 0 ASSESSMENTS - Focused Assessment []  - Circumferential Edema Measurements - multi extremities 0 []  - Nutritional Assessment / Counseling / Intervention 0 []  - Lower Extremity Assessment (monofilament, tuning fork, pulses) 0 []  - Peripheral Arterial Disease Assessment (using hand held doppler) 0 ASSESSMENTS - Ostomy and/or Continence Assessment and Care []  - Incontinence Assessment and Management 0 []  - Ostomy Care Assessment and Management (repouching, etc.) 0 PROCESS - Coordination of Care X - Simple Patient / Family Education for ongoing care 1 15 []  - Complex (extensive) Patient / Family Education for ongoing care 0 []  - Staff obtains Chiropractor, Records, Test Results / Process Orders 0 []  - Staff telephones HHA, Nursing Homes / Clarify orders / etc 0 []  - Routine Transfer to another Facility (non-emergent condition) 0 Lee Clark, Lee Clark ([id number]) []  - Routine Hospital Admission (non-emergent condition) 0 []  - New Admissions / Manufacturing engineer / Ordering NPWT, Apligraf, etc. 0 []  - Emergency Hospital Admission (emergent condition) 0 X - Simple Discharge Coordination 1 10 []  - Complex (extensive) Discharge Coordination 0 PROCESS - Special Needs []  - Pediatric / Minor Patient Management 0 []  - Isolation Patient Management 0 []  - Hearing / Language / Visual special needs 0 []  - Assessment of Community assistance (transportation, D/C planning, etc.) 0 []  - Additional assistance / Altered mentation 0 []  - Support Surface(s) Assessment (bed, cushion, seat, etc.) 0 INTERVENTIONS - Wound Cleansing /  Measurement X - Simple Wound Cleansing - one wound 1 5 []  - Complex Wound Cleansing - multiple wounds 0 X - Wound Imaging (  photographs - any number of wounds) 1 5  - Wound Tracing (instead of photographs) 0 X - Simple Wound Measurement - one wound 1 5  - Complex Wound Measurement - multiple wounds 0 INTERVENTIONS - Wound Dressings  - Small Wound Dressing one or multiple wounds 0  - Medium Wound Dressing one or multiple wounds 0  - Large Wound Dressing one or multiple wounds 0  - Application of Medications - topical 0  - Application of Medications - injection 0 INTERVENTIONS - Miscellaneous  - External ear exam 0 Lee Clark, Lee Clark. ([id number])  - Specimen Collection (cultures, biopsies, blood, body fluids, etc.) 0  - Specimen(s) / Culture(s) sent or taken to Lab for analysis 0 X - Patient Transfer (multiple staff / Michiel Sites Lift / Similar devices) 1 10  - Simple Staple / Suture removal (25 or less) 0  - Complex Staple / Suture removal (26 or more) 0  - Hypo / Hyperglycemic Management (close monitor of Blood Glucose) 0  - Ankle / Brachial Index (ABI) - do not check if billed separately 0 X - Vital Signs 1 5 Has the patient been seen at the hospital within the last three years: Yes Total Score: 65 Level Of Care: New/Established - Level 2 Electronic Signature(s) Signed: 12/23/2014 4:58:31 PM By: Elliot Gurney, RN, BSN, Kim RN, BSN Entered By: Elliot Gurney, RN, BSN, Kim on 12/21/2014 15:07:50 Lee Clark ([id number]) -------------------------------------------------------------------------------- Encounter Discharge Information Details Patient Name: Lee Clark Date of Service: 12/21/2014 2:15 PM Medical Record Number: [id number] Patient Account Number: [id number] Date of Birth/Sex: 11-Oct-1955 (59 y.o. Male) Treating RN: Huel Coventry Primary Care Physician: [name] Other Clinician: Referring Physician: [name] Treating Physician/Extender: Rudene Re in Treatment: 2 Encounter Discharge Information Items Discharge Pain Level: 0 Discharge Condition: Stable Ambulatory Status: Wheelchair Discharge Destination: Home Private Transportation: Auto Accompanied By: fiance Schedule Follow-up Appointment: Yes Medication Reconciliation completed and Yes provided to Patient/Care Jonnelle Lawniczak: Clinical Summary of Care: Electronic Signature(s) Signed: 12/23/2014 4:58:31 PM By: Elliot Gurney, RN, BSN, Kim RN, BSN Entered By: Elliot Gurney, RN, BSN, Kim on 12/21/2014 15:09:15 Lee Clark ([id number]) -------------------------------------------------------------------------------- Lower Extremity Assessment Details Patient Name: Lee Clark Date of Service: 12/21/2014 2:15 PM Medical Record Number: [id number] Patient Account Number: [id number] Date of Birth/Sex: 1956-05-16 (59 y.o. Male) Treating RN: Huel Coventry Primary Care Physician: [name] Other Clinician: Referring Physician: [name] Treating Physician/Extender: Rudene Re in Treatment: 2 Electronic Signature(s) Signed: 12/23/2014 4:58:31 PM By: Elliot Gurney, RN, BSN, Kim RN, BSN Entered By: Elliot Gurney, RN, BSN, Kim on 12/21/2014 14:39:34 Lee Clark ([id number]) -------------------------------------------------------------------------------- Multi Wound Chart Details Patient Name: Lee Clark Date of Service: 12/21/2014 2:15 PM Medical Record Number: [id number] Patient Account Number: [id number] Date of Birth/Sex: Nov 19, 1955 (59 y.o. Male) Treating RN: Huel Coventry Primary Care Physician: [name] Other Clinician: Referring Physician: [name] Treating Physician/Extender: Rudene Re in Treatment: 2 Vital Signs Height(in): 74 Pulse(bpm): 88 Weight(lbs): 140 Blood Pressure 128/68 (mmHg): Body Mass Index(BMI): 18 Temperature(F): Respiratory Rate 18 (breaths/min): Photos: [1:No Photos] [N/A:N/A] Wound Location: [1:Right Gluteus]  [N/A:N/A] Wounding Event: [1:Pressure Injury] [N/A:N/A] Primary Etiology: [1:Pressure Ulcer] [N/A:N/A] Comorbid History: [1:History of pressure wounds, Osteoarthritis] [N/A:N/A] Date Acquired: [1:11/01/2014] [N/A:N/A] Weeks of Treatment: [1:2] [N/A:N/A] Wound Status: [1:Open] [N/A:N/A] Measurements L x W x D 1.5x0.3x0.2 [N/A:N/A] (cm) Area (cm) : [1:0.353] [N/A:N/A] Volume (cm) : [1:0.071] [N/A:N/A] % Reduction in Area: [1:82.00%] [N/A:N/A] % Reduction in Volume: 81.90% [N/A:N/A] Classification: [1:Category/Stage III] [N/A:N/A] Exudate Amount: [1:Medium] [N/A:N/A] Exudate Type: [1:Serosanguineous] [  N/A:N/A] Exudate Color: [1:red, brown] [N/A:N/A] Wound Margin: [1:Distinct, outline attached] [N/A:N/A] Granulation Amount: [1:Medium (34-66%)] [N/A:N/A] Granulation Quality: [1:Pink, Pale] [N/A:N/A] Necrotic Amount: [1:None Present (0%)] [N/A:N/A] Exposed Structures: [1:Fascia: No Fat: No Tendon: No Muscle: No Joint: No Bone: No] [N/A:N/A] Limited to Skin Breakdown Epithelialization: None N/A N/A Periwound Skin Texture: Edema: No N/A N/A Excoriation: No Induration: No Callus: No Crepitus: No Fluctuance: No Friable: No Rash: No Scarring: No Periwound Skin Moist: Yes N/A N/A Moisture: Maceration: No Dry/Scaly: No Periwound Skin Color: Atrophie Blanche: No N/A N/A Cyanosis: No Ecchymosis: No Erythema: No Hemosiderin Staining: No Mottled: No Pallor: No Rubor: No Temperature: No Abnormality N/A N/A Tenderness on No N/A N/A Palpation: Wound Preparation: Ulcer Cleansing: N/A N/A Rinsed/Irrigated with Saline Topical Anesthetic Applied: Other: lidocaine 4% Treatment Notes Electronic Signature(s) Signed: 12/23/2014 4:58:31 PM By: Elliot Gurney, RN, BSN, Kim RN, BSN Entered By: Elliot Gurney, RN, BSN, Kim on 12/21/2014 14:41:07 Lee Clark ([id number]) -------------------------------------------------------------------------------- Multi-Disciplinary Care Plan Details Patient  Name: Lee Clark Date of Service: 12/21/2014 2:15 PM Medical Record Number: [id number] Patient Account Number: [id number] Date of Birth/Sex: Oct 01, 1955 (59 y.o. Male) Treating RN: Huel Coventry Primary Care Physician: [name] Other Clinician: Referring Physician: [name] Treating Physician/Extender: Rudene Re in Treatment: 2 Active Inactive Orientation to the Wound Care Program Nursing Diagnoses: Knowledge deficit related to the wound healing center program Goals: Patient/caregiver will verbalize understanding of the Wound Healing Center Program Date Initiated: 12/01/2014 Goal Status: Active Interventions: Provide education on orientation to the wound center Notes: Pressure Nursing Diagnoses: Knowledge deficit related to causes and risk factors for pressure ulcer development Knowledge deficit related to management of pressures ulcers Potential for impaired tissue integrity related to pressure, friction, moisture, and shear Goals: Patient will remain free from development of additional pressure ulcers Date Initiated: 12/01/2014 Goal Status: Active Patient will remain free of pressure ulcers Date Initiated: 12/01/2014 Goal Status: Active Patient/caregiver will verbalize risk factors for pressure ulcer development Date Initiated: 12/01/2014 Goal Status: Active Patient/caregiver will verbalize understanding of pressure ulcer management Date Initiated: 12/01/2014 Goal Status: Active Interventions: Lee Clark, Lee Clark ([id number]) Assess: immobility, friction, shearing, incontinence upon admission and as needed Assess offloading mechanisms upon admission and as needed Assess potential for pressure ulcer upon admission and as needed Provide education on pressure ulcers Treatment Activities: Patient referred for home evaluation of offloading devices/mattresses : 12/21/2014 Patient referred for pressure reduction/relief devices : 12/21/2014 Patient referred for  seating evaluation to ensure proper offloading : 12/21/2014 Pressure reduction/relief device ordered : 12/21/2014 Notes: Wound/Skin Impairment Nursing Diagnoses: Impaired tissue integrity Knowledge deficit related to ulceration/compromised skin integrity Goals: Patient/caregiver will verbalize understanding of skin care regimen Date Initiated: 12/01/2014 Goal Status: Active Ulcer/skin breakdown will have a volume reduction of 30% by week 4 Date Initiated: 12/01/2014 Goal Status: Active Ulcer/skin breakdown will have a volume reduction of 50% by week 8 Date Initiated: 12/01/2014 Goal Status: Active Ulcer/skin breakdown will have a volume reduction of 80% by week 12 Date Initiated: 12/01/2014 Goal Status: Active Ulcer/skin breakdown will heal within 14 weeks Date Initiated: 12/01/2014 Goal Status: Active Interventions: Assess patient/caregiver ability to perform ulcer/skin care regimen upon admission and as needed Assess ulceration(s) every visit Provide education on ulcer and skin care Treatment Activities: Skin care regimen initiated : 12/21/2014 Topical wound management initiated : 12/21/2014 Lee Clark, Lee Clark ([id number]) Notes: Electronic Signature(s) Signed: 12/23/2014 4:58:31 PM By: Elliot Gurney, RN, BSN, Kim RN, BSN Entered By: Elliot Gurney, RN, BSN, Kim on 12/21/2014 14:40:56 Lee Clark, Lee Clark ([id number]) --------------------------------------------------------------------------------  Pain Assessment Details Patient Name: Lee Clark, Lee Clark Date of Service: 12/21/2014 2:15 PM Medical Record Number: [id number] Patient Account Number: [id number] Date of Birth/Sex: 1956-04-04 (59 y.o. Male) Treating RN: Huel Coventry Primary Care Physician: [name] Other Clinician: Referring Physician: [name] Treating Physician/Extender: Rudene Re in Treatment: 2 Active Problems Location of Pain Severity and Description of Pain Patient Has Paino No Site Locations Pain Management and  Medication Current Pain Management: Electronic Signature(s) Signed: 12/23/2014 4:58:31 PM By: Elliot Gurney, RN, BSN, Kim RN, BSN Entered By: Elliot Gurney, RN, BSN, Kim on 12/21/2014 14:37:04 Lee Clark ([id number]) -------------------------------------------------------------------------------- Patient/Caregiver Education Details Patient Name: Lee Clark Date of Service: 12/21/2014 2:15 PM Medical Record Number: [id number] Patient Account Number: [id number] Date of Birth/Gender: 1955/06/01 (59 y.o. Male) Treating RN: Huel Coventry Primary Care Physician: [name] Other Clinician: Referring Physician: [name] Treating Physician/Extender: Rudene Re in Treatment: 2 Education Assessment Education Provided To: Patient Education Topics Provided Pressure: Handouts: Pressure Ulcers: Care and Offloading, Other: change positions often Methods: Demonstration Responses: State content correctly Wound/Skin Impairment: Handouts: Smoking and Wound Healing, Other: continue wound care as prescribed Electronic Signature(s) Signed: 12/23/2014 4:58:31 PM By: Elliot Gurney, RN, BSN, Kim RN, BSN Entered By: Elliot Gurney, RN, BSN, Kim on 12/21/2014 15:10:25 Lee Clark ([id number]) -------------------------------------------------------------------------------- Wound Assessment Details Patient Name: Lee Clark Date of Service: 12/21/2014 2:15 PM Medical Record Number: [id number] Patient Account Number: [id number] Date of Birth/Sex: 13-Jul-1955 (59 y.o. Male) Treating RN: Huel Coventry Primary Care Physician: [name] Other Clinician: Referring Physician: [name] Treating Physician/Extender: Rudene Re in Treatment: 2 Wound Status Wound Number: 1 Primary Pressure Ulcer Etiology: Wound Location: Right Gluteus Wound Status: Open Wounding Event: Pressure Injury Comorbid History of pressure wounds, Date Acquired: 11/01/2014 History: Osteoarthritis Weeks Of Treatment:  2 Clustered Wound: No Photos Photo Uploaded By: Elliot Gurney, RN, BSN, Kim on 12/21/2014 15:52:40 Wound Measurements Length: (cm) 1.5 Width: (cm) 0.3 Depth: (cm) 0.2 Area: (cm) 0.353 Volume: (cm) 0.071 % Reduction in Area: 82% % Reduction in Volume: 81.9% Epithelialization: None Wound Description Classification: Category/Stage III Wound Margin: Distinct, outline attached Exudate Amount: Medium Exudate Type: Serosanguineous Exudate Color: red, brown Foul Odor After Cleansing: No Wound Bed Granulation Amount: Medium (34-66%) Exposed Structure Granulation Quality: Pink, Pale Fascia Exposed: No Necrotic Amount: None Present (0%) Fat Layer Exposed: No Tendon Exposed: No Lee Clark, Lee Clark. ([id number]) Muscle Exposed: No Joint Exposed: No Bone Exposed: No Limited to Skin Breakdown Periwound Skin Texture Texture Color No Abnormalities Noted: No No Abnormalities Noted: No Callus: No Atrophie Blanche: No Crepitus: No Cyanosis: No Excoriation: No Ecchymosis: No Fluctuance: No Erythema: No Friable: No Hemosiderin Staining: No Induration: No Mottled: No Localized Edema: No Pallor: No Rash: No Rubor: No Scarring: No Temperature / Pain Moisture Temperature: No Abnormality No Abnormalities Noted: No Dry / Scaly: No Maceration: No Moist: Yes Wound Preparation Ulcer Cleansing: Rinsed/Irrigated with Saline Topical Anesthetic Applied: Other: lidocaine 4%, Treatment Notes Wound #1 (Right Gluteus) 1. Cleansed with: Clean wound with Normal Saline 2. Anesthetic Topical Lidocaine 4% cream to wound bed prior to debridement 4. Dressing Applied: Prisma Ag 5. Secondary Dressing Applied Bordered Foam Dressing Notes Nurse, adult) Signed: 12/23/2014 4:58:31 PM By: Elliot Gurney, RN, BSN, Kim RN, BSN Entered By: Elliot Gurney, RN, BSN, Kim on 12/21/2014 14:40:29 Lee Clark  ([id number]) -------------------------------------------------------------------------------- Vitals Details Patient Name: Lee Clark Date of Service: 12/21/2014 2:15 PM Medical Record Number: [id number] Patient Account Number: [id number] Date of Birth/Sex: 07-01-1955 (59 y.o. Male)  Treating RN: Huel Coventry Primary Care Physician: [name] Other Clinician: Referring Physician: [name] Treating Physician/Extender: Rudene Re in Treatment: 2 Vital Signs Time Taken: 14:39 Pulse (bpm): 88 Height (in): 74 Respiratory Rate (breaths/min): 18 Weight (lbs): 140 Blood Pressure (mmHg): 128/68 Body Mass Index (BMI): 18 Reference Range: 80 - 120 mg / dl Electronic Signature(s) Signed: 12/23/2014 4:58:31 PM By: Elliot Gurney, RN, BSN, Kim RN, BSN Entered By: Elliot Gurney, RN, BSN, Kim on 12/21/2014 14:39:24

## 2014-12-25 DIAGNOSIS — M6281 Muscle weakness (generalized): Secondary | ICD-10-CM | POA: Diagnosis not present

## 2014-12-25 DIAGNOSIS — F17210 Nicotine dependence, cigarettes, uncomplicated: Secondary | ICD-10-CM | POA: Diagnosis not present

## 2014-12-25 DIAGNOSIS — I252 Old myocardial infarction: Secondary | ICD-10-CM | POA: Diagnosis not present

## 2014-12-25 DIAGNOSIS — F329 Major depressive disorder, single episode, unspecified: Secondary | ICD-10-CM | POA: Diagnosis not present

## 2014-12-25 DIAGNOSIS — S34109S Unspecified injury to unspecified level of lumbar spinal cord, sequela: Secondary | ICD-10-CM | POA: Diagnosis not present

## 2014-12-25 DIAGNOSIS — G8220 Paraplegia, unspecified: Secondary | ICD-10-CM | POA: Diagnosis not present

## 2014-12-28 DIAGNOSIS — S82102D Unspecified fracture of upper end of left tibia, subsequent encounter for closed fracture with routine healing: Secondary | ICD-10-CM | POA: Diagnosis not present

## 2014-12-29 DIAGNOSIS — I252 Old myocardial infarction: Secondary | ICD-10-CM | POA: Diagnosis not present

## 2014-12-29 DIAGNOSIS — S34109S Unspecified injury to unspecified level of lumbar spinal cord, sequela: Secondary | ICD-10-CM | POA: Diagnosis not present

## 2014-12-29 DIAGNOSIS — M6281 Muscle weakness (generalized): Secondary | ICD-10-CM | POA: Diagnosis not present

## 2014-12-29 DIAGNOSIS — F17210 Nicotine dependence, cigarettes, uncomplicated: Secondary | ICD-10-CM | POA: Diagnosis not present

## 2014-12-29 DIAGNOSIS — G8220 Paraplegia, unspecified: Secondary | ICD-10-CM | POA: Diagnosis not present

## 2014-12-29 DIAGNOSIS — F329 Major depressive disorder, single episode, unspecified: Secondary | ICD-10-CM | POA: Diagnosis not present

## 2015-01-01 DIAGNOSIS — F17210 Nicotine dependence, cigarettes, uncomplicated: Secondary | ICD-10-CM | POA: Diagnosis not present

## 2015-01-01 DIAGNOSIS — G8220 Paraplegia, unspecified: Secondary | ICD-10-CM | POA: Diagnosis not present

## 2015-01-01 DIAGNOSIS — F329 Major depressive disorder, single episode, unspecified: Secondary | ICD-10-CM | POA: Diagnosis not present

## 2015-01-01 DIAGNOSIS — M6281 Muscle weakness (generalized): Secondary | ICD-10-CM | POA: Diagnosis not present

## 2015-01-01 DIAGNOSIS — S34109S Unspecified injury to unspecified level of lumbar spinal cord, sequela: Secondary | ICD-10-CM | POA: Diagnosis not present

## 2015-01-01 DIAGNOSIS — I252 Old myocardial infarction: Secondary | ICD-10-CM | POA: Diagnosis not present

## 2015-01-04 ENCOUNTER — Ambulatory Visit: Payer: Self-pay | Admitting: Surgery

## 2015-01-05 DIAGNOSIS — I252 Old myocardial infarction: Secondary | ICD-10-CM | POA: Diagnosis not present

## 2015-01-05 DIAGNOSIS — F329 Major depressive disorder, single episode, unspecified: Secondary | ICD-10-CM | POA: Diagnosis not present

## 2015-01-05 DIAGNOSIS — M6281 Muscle weakness (generalized): Secondary | ICD-10-CM | POA: Diagnosis not present

## 2015-01-05 DIAGNOSIS — S34109S Unspecified injury to unspecified level of lumbar spinal cord, sequela: Secondary | ICD-10-CM | POA: Diagnosis not present

## 2015-01-05 DIAGNOSIS — F17210 Nicotine dependence, cigarettes, uncomplicated: Secondary | ICD-10-CM | POA: Diagnosis not present

## 2015-01-05 DIAGNOSIS — G8220 Paraplegia, unspecified: Secondary | ICD-10-CM | POA: Diagnosis not present

## 2015-01-08 DIAGNOSIS — G8220 Paraplegia, unspecified: Secondary | ICD-10-CM | POA: Diagnosis not present

## 2015-01-08 DIAGNOSIS — I252 Old myocardial infarction: Secondary | ICD-10-CM | POA: Diagnosis not present

## 2015-01-08 DIAGNOSIS — F329 Major depressive disorder, single episode, unspecified: Secondary | ICD-10-CM | POA: Diagnosis not present

## 2015-01-08 DIAGNOSIS — M6281 Muscle weakness (generalized): Secondary | ICD-10-CM | POA: Diagnosis not present

## 2015-01-08 DIAGNOSIS — S34109S Unspecified injury to unspecified level of lumbar spinal cord, sequela: Secondary | ICD-10-CM | POA: Diagnosis not present

## 2015-01-08 DIAGNOSIS — F17210 Nicotine dependence, cigarettes, uncomplicated: Secondary | ICD-10-CM | POA: Diagnosis not present

## 2015-01-11 ENCOUNTER — Encounter: Payer: Medicare Other | Admitting: Surgery

## 2015-01-11 DIAGNOSIS — F17218 Nicotine dependence, cigarettes, with other nicotine-induced disorders: Secondary | ICD-10-CM | POA: Diagnosis not present

## 2015-01-11 DIAGNOSIS — L89313 Pressure ulcer of right buttock, stage 3: Secondary | ICD-10-CM | POA: Diagnosis not present

## 2015-01-11 DIAGNOSIS — G8222 Paraplegia, incomplete: Secondary | ICD-10-CM | POA: Diagnosis not present

## 2015-01-11 NOTE — Progress Notes (Signed)
ETAI, COPADO ([id number]) Visit Report for 01/11/2015 Arrival Information Details Patient Name: Lee Clark, Lee Clark Date of Service: 01/11/2015 2:00 PM Medical Record Number: [id number] Patient Account Number: [id number] Date of Birth/Sex: 03-31-1956 (59 y.o. Male) Treating RN: Curtis Sites Primary Care Physician: [name] Other Clinician: Referring Physician: [name] Treating Physician/Extender: Rudene Re in Treatment: 5 Visit Information History Since Last Visit Added or deleted any medications: No Patient Arrived: Wheel Chair Any new allergies or adverse reactions: No Arrival Time: 14:40 Had a fall or experienced change in No Accompanied By: spouse activities of daily living that may affect Transfer Assistance: Michiel Sites Lift risk of falls: Patient Identification Verified: Yes Signs or symptoms of abuse/neglect since last No Secondary Verification Process Yes visito Completed: Hospitalized since last visit: No Patient Requires Transmission- No Pain Present Now: No Based Precautions: Patient Has Alerts: Yes Patient Alerts: Patient on Blood Thinner Electronic Signature(s) Signed: 01/11/2015 4:45:58 PM By: Curtis Sites Entered By: Curtis Sites on 01/11/2015 16:45:58 Lee Clark ([id number]) -------------------------------------------------------------------------------- Clinic Level of Care Assessment Details Patient Name: Lee Clark Date of Service: 01/11/2015 2:00 PM Medical Record Number: [id number] Patient Account Number: [id number] Date of Birth/Sex: 30-Aug-1955 (59 y.o. Male) Treating RN: Curtis Sites Primary Care Physician: [name] Other Clinician: Referring Physician: [name] Treating Physician/Extender: Rudene Re in Treatment: 5 Clinic Level of Care Assessment Items TOOL 4 Quantity Score []  - Use when only an EandM is performed on FOLLOW-UP visit 0 ASSESSMENTS - Nursing Assessment / Reassessment X  - Reassessment of Co-morbidities (includes updates in patient status) 1 10 X - Reassessment of Adherence to Treatment Plan 1 5 ASSESSMENTS - Lee and Skin Assessment / Reassessment X - Simple Lee Assessment / Reassessment - one Lee 1 5 []  - Complex Lee Assessment / Reassessment - multiple wounds 0 []  - Dermatologic / Skin Assessment (not related to Lee area) 0 ASSESSMENTS - Focused Assessment []  - Circumferential Edema Measurements - multi extremities 0 []  - Nutritional Assessment / Counseling / Intervention 0 []  - Lower Extremity Assessment (monofilament, tuning fork, pulses) 0 []  - Peripheral Arterial Disease Assessment (using hand held doppler) 0 ASSESSMENTS - Ostomy and/or Continence Assessment and Care []  - Incontinence Assessment and Management 0 []  - Ostomy Care Assessment and Management (repouching, etc.) 0 PROCESS - Coordination of Care X - Simple Patient / Family Education for ongoing care 1 15 []  - Complex (extensive) Patient / Family Education for ongoing care 0 []  - Staff obtains Chiropractor, Records, Test Results / Process Orders 0 []  - Staff telephones HHA, Nursing Homes / Clarify orders / etc 0 []  - Routine Transfer to another Facility (non-emergent condition) 0 Lee Clark, [facility]. ([id number]) []  - Routine Hospital Admission (non-emergent condition) 0 []  - New Admissions / Manufacturing engineer / Ordering NPWT, Apligraf, etc. 0 []  - Emergency Hospital Admission (emergent condition) 0 X - Simple Discharge Coordination 1 10 []  - Complex (extensive) Discharge Coordination 0 PROCESS - Special Needs []  - Pediatric / Minor Patient Management 0 []  - Isolation Patient Management 0 []  - Hearing / Language / Visual special needs 0 []  - Assessment of Community assistance (transportation, D/C planning, etc.) 0 []  - Additional assistance / Altered mentation 0 []  - Support Surface(s) Assessment (bed, cushion, seat, etc.) 0 INTERVENTIONS - Lee Cleansing / Measurement X -  Simple Lee Cleansing - one Lee 1 5 []  - Complex Lee Cleansing - multiple wounds 0 X - Lee Imaging (photographs - any number of wounds) 1 5 []  -  Lee Tracing (instead of photographs) 0 X - Simple Lee Measurement - one Lee 1 5 []  - Complex Lee Measurement - multiple wounds 0 INTERVENTIONS - Lee Dressings X - Small Lee Dressing one or multiple wounds 1 10 []  - Medium Lee Dressing one or multiple wounds 0 []  - Large Lee Dressing one or multiple wounds 0 []  - Application of Medications - topical 0 []  - Application of Medications - injection 0 INTERVENTIONS - Miscellaneous []  - External ear exam 0 Lee Clark, Lee Clark ([id number]) []  - Specimen Collection (cultures, biopsies, blood, body fluids, etc.) 0 []  - Specimen(s) / Culture(s) sent or taken to Lab for analysis 0 []  - Patient Transfer (multiple staff / Michiel Sites Lift / Similar devices) 0 []  - Simple Staple / Suture removal (25 or less) 0 []  - Complex Staple / Suture removal (26 or more) 0 []  - Hypo / Hyperglycemic Management (close monitor of Blood Glucose) 0 []  - Ankle / Brachial Index (ABI) - do not check if billed separately 0 X - Vital Signs 1 5 Has the patient been seen at the hospital within the last three years: Yes Total Score: 75 Level Of Care: New/Established - Level 2 Electronic Signature(s) Signed: 01/11/2015 5:14:47 PM By: Curtis Sites Entered By: Curtis Sites on 01/11/2015 17:14:47 Lee Clark ([id number]) -------------------------------------------------------------------------------- Encounter Discharge Information Details Patient Name: Lee Clark Date of Service: 01/11/2015 2:00 PM Medical Record Number: [id number] Patient Account Number: [id number] Date of Birth/Sex: [birth year] (59 y.o. Male) Treating RN: Curtis Sites Primary Care Physician: [name] Other Clinician: Referring Physician: [name] Treating Physician/Extender: Rudene Re in Treatment:  5 Encounter Discharge Information Items Schedule Follow-up Appointment: No Medication Reconciliation completed No and provided to Patient/Care Sarahann Horrell: Provided on Clinical Summary of Care: 01/11/2015 Form Type Recipient Paper Patient TD Electronic Signature(s) Signed: 01/11/2015 3:24:32 PM By: Gwenlyn Perking Entered By: Gwenlyn Perking on 01/11/2015 15:24:31 Lee Clark ([id number]) -------------------------------------------------------------------------------- Multi Lee Chart Details Patient Name: Lee Clark Date of Service: 01/11/2015 2:00 PM Medical Record Number: [id number] Patient Account Number: [id number] Date of Birth/Sex: 04/14/1956 (59 y.o. Male) Treating RN: Curtis Sites Primary Care Physician: [name] Other Clinician: Referring Physician: [name] Treating Physician/Extender: Rudene Re in Treatment: 5 Vital Signs Height(in): 74 Pulse(bpm): 87 Weight(lbs): 140 Blood Pressure 126/77 (mmHg): Body Mass Index(BMI): 18 Temperature(F): 99.0 Respiratory Rate 18 (breaths/min): Photos: [1:No Photos] [N/A:N/A] Lee Location: [1:Right Gluteus] [N/A:N/A] Wounding Event: [1:Pressure Injury] [N/A:N/A] Primary Etiology: [1:Pressure Ulcer] [N/A:N/A] Comorbid History: [1:History of pressure wounds, Osteoarthritis] [N/A:N/A] Date Acquired: [1:11/01/2014] [N/A:N/A] Weeks of Treatment: [1:5] [N/A:N/A] Lee Status: [1:Open] [N/A:N/A] Measurements L x W x D 0.4x0.1x0.2 [N/A:N/A] (cm) Area (cm) : [1:0.031] [N/A:N/A] Volume (cm) : [1:0.006] [N/A:N/A] % Reduction in Area: [1:98.40%] [N/A:N/A] % Reduction in Volume: 98.50% [N/A:N/A] Classification: [1:Category/Stage III] [N/A:N/A] Exudate Amount: [1:Medium] [N/A:N/A] Exudate Type: [1:Serosanguineous] [N/A:N/A] Exudate Color: [1:red, brown] [N/A:N/A] Lee Margin: [1:Distinct, outline attached] [N/A:N/A] Granulation Amount: [1:Large (67-100%)] [N/A:N/A] Granulation Quality: [1:Pink]  [N/A:N/A] Necrotic Amount: [1:None Present (0%)] [N/A:N/A] Exposed Structures: [1:Fascia: No Fat: No Tendon: No Muscle: No Joint: No Bone: No] [N/A:N/A] Limited to Skin Breakdown Epithelialization: None N/A N/A Periwound Skin Texture: Edema: No N/A N/A Excoriation: No Induration: No Callus: No Crepitus: No Fluctuance: No Friable: No Rash: No Scarring: No Periwound Skin Moist: Yes N/A N/A Moisture: Maceration: No Dry/Scaly: No Periwound Skin Color: Atrophie Blanche: No N/A N/A Cyanosis: No Ecchymosis: No Erythema: No Hemosiderin Staining: No Mottled: No Pallor: No Rubor: No Temperature: No Abnormality N/A  N/A Tenderness on No N/A N/A Palpation: Lee Preparation: Ulcer Cleansing: N/A N/A Rinsed/Irrigated with Saline Topical Anesthetic Applied: Other: lidocaine 4% Treatment Notes Electronic Signature(s) Signed: 01/11/2015 4:47:42 PM By: Curtis Sites Entered By: Curtis Sites on 01/11/2015 16:47:42 Lee Clark, Lee Clark ([id number]) -------------------------------------------------------------------------------- Multi-Disciplinary Care Plan Details Patient Name: Lee Clark Date of Service: 01/11/2015 2:00 PM Medical Record Number: [id number] Patient Account Number: [id number] Date of Birth/Sex: [birth year] (59 y.o. Male) Treating RN: Curtis Sites Primary Care Physician: [name] Other Clinician: Referring Physician: [name] Treating Physician/Extender: Rudene Re in Treatment: 5 Active Inactive Orientation to the [facility] Program Nursing Diagnoses: Knowledge deficit related to the [facility] program Goals: Patient/caregiver will verbalize understanding of the [facility] Date Initiated: 12/01/2014 Goal Status: Active Interventions: Provide education on orientation to the [facility] center Notes: Pressure Nursing Diagnoses: Knowledge deficit related to causes and risk factors for pressure ulcer  development Knowledge deficit related to management of pressures ulcers Potential for impaired Lee Clark integrity related to pressure, friction, moisture, and shear Goals: Patient will remain free from development of additional pressure ulcers Date Initiated: 12/01/2014 Goal Status: Active Patient will remain free of pressure ulcers Date Initiated: 12/01/2014 Goal Status: Active Patient/caregiver will verbalize risk factors for pressure ulcer development Date Initiated: 12/01/2014 Goal Status: Active Patient/caregiver will verbalize understanding of pressure ulcer management Date Initiated: 12/01/2014 Goal Status: Active Interventions: Lee Clark, Lee Clark ([id number]) Assess: immobility, friction, shearing, incontinence upon admission and as needed Assess offloading mechanisms upon admission and as needed Assess potential for pressure ulcer upon admission and as needed Provide education on pressure ulcers Treatment Activities: Patient referred for home evaluation of offloading devices/mattresses : 01/11/2015 Patient referred for pressure reduction/relief devices : 01/11/2015 Patient referred for seating evaluation to ensure proper offloading : 01/11/2015 Pressure reduction/relief device ordered : 01/11/2015 Notes: Lee/Skin Impairment Nursing Diagnoses: Impaired Lee Clark integrity Knowledge deficit related to ulceration/compromised skin integrity Goals: Patient/caregiver will verbalize understanding of skin care regimen Date Initiated: 12/01/2014 Goal Status: Active Ulcer/skin breakdown will have a volume reduction of 30% by week 4 Date Initiated: 12/01/2014 Goal Status: Active Ulcer/skin breakdown will have a volume reduction of 50% by week 8 Date Initiated: 12/01/2014 Goal Status: Active Ulcer/skin breakdown will have a volume reduction of 80% by week 12 Date Initiated: 12/01/2014 Goal Status: Active Ulcer/skin breakdown will heal within 14 weeks Date Initiated: 12/01/2014 Goal Status:  Active Interventions: Assess patient/caregiver ability to perform ulcer/skin care regimen upon admission and as needed Assess ulceration(s) every visit Provide education on ulcer and skin care Treatment Activities: Skin care regimen initiated : 01/11/2015 Topical Lee management initiated : 01/11/2015 Lee Clark, Lee Clark ([id number]) Notes: Electronic Signature(s) Signed: 01/11/2015 4:47:34 PM By: Curtis Sites Entered By: Curtis Sites on 01/11/2015 16:47:34 Lee Clark, Lee Clark ([id number]) -------------------------------------------------------------------------------- Lee Clark Date of Service: 01/11/2015 2:00 PM Medical Record Number: [id number] Patient Account Number: [id number] Date of Birth/Sex: 1955-12-21 (59 y.o. Male) Treating RN: Curtis Sites Primary Care Physician: [name] Other Clinician: Referring Physician: [name] Treating Physician/Extender: Rudene Re in Treatment: 5 Lee Status Lee Number: 1 Primary Pressure Ulcer Etiology: Lee Location: Right Gluteus Lee Status: Open Wounding Event: Pressure Injury Comorbid History of pressure wounds, Date Acquired: 11/01/2014 History: Osteoarthritis Weeks Of Treatment: 5 Clustered Lee: No Photos Photo Uploaded By: Curtis Sites on 01/11/2015 17:25:50 Lee Measurements Length: (cm) 0.4 Width: (cm) 0.1 Depth: (cm) 0.2 Area: (cm) 0.031 Volume: (cm) 0.006 % Reduction in Area: 98.4% % Reduction in  Volume: 98.5% Epithelialization: None Tunneling: No Undermining: No Lee Description Classification: Category/Stage III Lee Margin: Distinct, outline attached Exudate Amount: Medium Exudate Type: Serosanguineous Exudate Color: red, brown Foul Odor After Cleansing: No Lee Bed Granulation Amount: Large (67-100%) Exposed Structure Granulation Quality: Pink Fascia Exposed: No Necrotic Amount: None Present (0%) Fat Layer Exposed:  No Tendon Exposed: No SRIHAAN, MASTRANGELO. ([id number]) Muscle Exposed: No Joint Exposed: No Bone Exposed: No Limited to Skin Breakdown Periwound Skin Texture Texture Color No Abnormalities Noted: No No Abnormalities Noted: No Callus: No Atrophie Blanche: No Crepitus: No Cyanosis: No Excoriation: No Ecchymosis: No Fluctuance: No Erythema: No Friable: No Hemosiderin Staining: No Induration: No Mottled: No Localized Edema: No Pallor: No Rash: No Rubor: No Scarring: No Temperature / Pain Moisture Temperature: No Abnormality No Abnormalities Noted: No Dry / Scaly: No Maceration: No Moist: Yes Lee Preparation Ulcer Cleansing: Rinsed/Irrigated with Saline Topical Anesthetic Applied: Other: lidocaine 4%, Electronic Signature(s) Signed: 01/11/2015 4:47:25 PM By: Curtis Sites Entered By: Curtis Sites on 01/11/2015 16:47:25 Carusone, Lee Clark ([id number]) -------------------------------------------------------------------------------- Vitals Details Patient Name: Lee Clark Date of Service: 01/11/2015 2:00 PM Medical Record Number: [id number] Patient Account Number: [id number] Date of Birth/Sex: [birth year] (59 y.o. Male) Treating RN: Curtis Sites Primary Care Physician: [name] Other Clinician: Referring Physician: [name] Treating Physician/Extender: Rudene Re in Treatment: 5 Vital Signs Time Taken: 14:43 Temperature (F): 99.0 Height (in): 74 Pulse (bpm): 87 Weight (lbs): 140 Respiratory Rate (breaths/min): 18 Body Mass Index (BMI): 18 Blood Pressure (mmHg): 126/77 Reference Range: 80 - 120 mg / dl Electronic Signature(s) Signed: 01/11/2015 4:46:45 PM By: Curtis Sites Entered By: Curtis Sites on 01/11/2015 16:46:45
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
## 2015-01-13 DIAGNOSIS — I252 Old myocardial infarction: Secondary | ICD-10-CM | POA: Diagnosis not present

## 2015-01-13 DIAGNOSIS — F329 Major depressive disorder, single episode, unspecified: Secondary | ICD-10-CM | POA: Diagnosis not present

## 2015-01-13 DIAGNOSIS — F17210 Nicotine dependence, cigarettes, uncomplicated: Secondary | ICD-10-CM | POA: Diagnosis not present

## 2015-01-13 DIAGNOSIS — G8220 Paraplegia, unspecified: Secondary | ICD-10-CM | POA: Diagnosis not present

## 2015-01-13 DIAGNOSIS — M6281 Muscle weakness (generalized): Secondary | ICD-10-CM | POA: Diagnosis not present

## 2015-01-13 DIAGNOSIS — S34109S Unspecified injury to unspecified level of lumbar spinal cord, sequela: Secondary | ICD-10-CM | POA: Diagnosis not present

## 2015-01-16 DIAGNOSIS — F17210 Nicotine dependence, cigarettes, uncomplicated: Secondary | ICD-10-CM | POA: Diagnosis not present

## 2015-01-16 DIAGNOSIS — M6281 Muscle weakness (generalized): Secondary | ICD-10-CM | POA: Diagnosis not present

## 2015-01-16 DIAGNOSIS — I252 Old myocardial infarction: Secondary | ICD-10-CM | POA: Diagnosis not present

## 2015-01-16 DIAGNOSIS — S34109S Unspecified injury to unspecified level of lumbar spinal cord, sequela: Secondary | ICD-10-CM | POA: Diagnosis not present

## 2015-01-16 DIAGNOSIS — G8220 Paraplegia, unspecified: Secondary | ICD-10-CM | POA: Diagnosis not present

## 2015-01-16 DIAGNOSIS — F329 Major depressive disorder, single episode, unspecified: Secondary | ICD-10-CM | POA: Diagnosis not present

## 2015-01-18 ENCOUNTER — Encounter (HOSPITAL_BASED_OUTPATIENT_CLINIC_OR_DEPARTMENT_OTHER): Payer: Medicare Other | Admitting: General Surgery

## 2015-01-18 ENCOUNTER — Encounter: Payer: Self-pay | Admitting: General Surgery

## 2015-01-18 DIAGNOSIS — F17210 Nicotine dependence, cigarettes, uncomplicated: Secondary | ICD-10-CM | POA: Diagnosis not present

## 2015-01-18 DIAGNOSIS — G8222 Paraplegia, incomplete: Secondary | ICD-10-CM | POA: Diagnosis not present

## 2015-01-18 DIAGNOSIS — L89313 Pressure ulcer of right buttock, stage 3: Secondary | ICD-10-CM | POA: Diagnosis not present

## 2015-01-18 DIAGNOSIS — Z993 Dependence on wheelchair: Secondary | ICD-10-CM | POA: Diagnosis not present

## 2015-01-18 DIAGNOSIS — Z9181 History of falling: Secondary | ICD-10-CM | POA: Diagnosis not present

## 2015-01-18 DIAGNOSIS — F329 Major depressive disorder, single episode, unspecified: Secondary | ICD-10-CM | POA: Diagnosis not present

## 2015-01-18 DIAGNOSIS — L89312 Pressure ulcer of right buttock, stage 2: Secondary | ICD-10-CM | POA: Diagnosis not present

## 2015-01-18 DIAGNOSIS — G8220 Paraplegia, unspecified: Secondary | ICD-10-CM | POA: Diagnosis not present

## 2015-01-18 DIAGNOSIS — Z8673 Personal history of transient ischemic attack (TIA), and cerebral infarction without residual deficits: Secondary | ICD-10-CM | POA: Diagnosis not present

## 2015-01-18 DIAGNOSIS — S34109S Unspecified injury to unspecified level of lumbar spinal cord, sequela: Secondary | ICD-10-CM | POA: Diagnosis not present

## 2015-01-18 DIAGNOSIS — Y249XXS Unspecified firearm discharge, undetermined intent, sequela: Secondary | ICD-10-CM | POA: Diagnosis not present

## 2015-01-18 DIAGNOSIS — F17218 Nicotine dependence, cigarettes, with other nicotine-induced disorders: Secondary | ICD-10-CM | POA: Diagnosis not present

## 2015-01-18 NOTE — Progress Notes (Signed)
See i heal 

## 2015-01-18 NOTE — Progress Notes (Addendum)
CANTON, YEARBY ([id number]) Visit Report for 01/18/2015 Arrival Information Details Patient Name: Lee Clark, Lee Clark Date of Service: 01/18/2015 2:15 PM Medical Record Number: [id number] Patient Account Number: [id number] Date of Birth/Sex: June 23, 1955 (59 y.o. Male) Treating RN: Curtis Sites Primary Care Physician: [name] Other Clinician: Referring Physician: [name] Treating Physician/Extender: Elayne Snare in Treatment: 6 Visit Information History Since Last Visit Added or deleted any medications: No Patient Arrived: Wheel Chair Any new allergies or adverse reactions: No Arrival Time: 13:54 Had a fall or experienced change in No Accompanied By: spouse activities of daily living that may affect Transfer Assistance: Michiel Sites Lift risk of falls: Patient Identification Verified: Yes Signs or symptoms of abuse/neglect since last No Secondary Verification Process Yes visito Completed: Hospitalized since last visit: No Patient Requires Transmission- No Pain Present Now: No Based Precautions: Patient Has Alerts: Yes Patient Alerts: Patient on Blood Thinner Electronic Signature(s) Signed: 01/18/2015 2:50:48 PM By: Curtis Sites Entered By: Curtis Sites on 01/18/2015 13:55:17 Lee Clark ([id number]) -------------------------------------------------------------------------------- Clinic Level of Care Assessment Details Patient Name: Lee Clark Date of Service: 01/18/2015 2:15 PM Medical Record Number: [id number] Patient Account Number: [id number] Date of Birth/Sex: June 15, 1955 (59 y.o. Male) Treating RN: Curtis Sites Primary Care Physician: [name] Other Clinician: Referring Physician: [name] Treating Physician/Extender: Elayne Snare in Treatment: 6 Clinic Level of Care Assessment Items TOOL 4 Quantity Score []  - Use when only an EandM is performed on FOLLOW-UP visit 0 ASSESSMENTS - Nursing Assessment / Reassessment X  - Reassessment of Co-morbidities (includes updates in patient status) 1 10 X - Reassessment of Adherence to Treatment Plan 1 5 ASSESSMENTS - Wound and Skin Assessment / Reassessment X - Simple Wound Assessment / Reassessment - one wound 1 5 []  - Complex Wound Assessment / Reassessment - multiple wounds 0 []  - Dermatologic / Skin Assessment (not related to wound area) 0 ASSESSMENTS - Focused Assessment []  - Circumferential Edema Measurements - multi extremities 0 []  - Nutritional Assessment / Counseling / Intervention 0 []  - Lower Extremity Assessment (monofilament, tuning fork, pulses) 0 []  - Peripheral Arterial Disease Assessment (using hand held doppler) 0 ASSESSMENTS - Ostomy and/or Continence Assessment and Care []  - Incontinence Assessment and Management 0 []  - Ostomy Care Assessment and Management (repouching, etc.) 0 PROCESS - Coordination of Care X - Simple Patient / Family Education for ongoing care 1 15 []  - Complex (extensive) Patient / Family Education for ongoing care 0 []  - Staff obtains Chiropractor, Records, Test Results / Process Orders 0 []  - Staff telephones HHA, Nursing Homes / Clarify orders / etc 0 []  - Routine Transfer to another Facility (non-emergent condition) 0 Lee Clark, SONN. ([id number]) []  - Routine Hospital Admission (non-emergent condition) 0 []  - New Admissions / Manufacturing engineer / Ordering NPWT, Apligraf, etc. 0 []  - Emergency Hospital Admission (emergent condition) 0 X - Simple Discharge Coordination 1 10 []  - Complex (extensive) Discharge Coordination 0 PROCESS - Special Needs []  - Pediatric / Minor Patient Management 0 []  - Isolation Patient Management 0 []  - Hearing / Language / Visual special needs 0 []  - Assessment of Community assistance (transportation, D/C planning, etc.) 0 []  - Additional assistance / Altered mentation 0 []  - Support Surface(s) Assessment (bed, cushion, seat, etc.) 0 INTERVENTIONS - Wound Cleansing / Measurement X -  Simple Wound Cleansing - one wound 1 5 []  - Complex Wound Cleansing - multiple wounds 0 X - Wound Imaging (photographs - any number of wounds) 1 5 []  -  Wound Tracing (instead of photographs) 0 X - Simple Wound Measurement - one wound 1 5  - Complex Wound Measurement - multiple wounds 0 INTERVENTIONS - Wound Dressings X - Small Wound Dressing one or multiple wounds 1 10  - Medium Wound Dressing one or multiple wounds 0  - Large Wound Dressing one or multiple wounds 0  - Application of Medications - topical 0  - Application of Medications - injection 0 INTERVENTIONS - Miscellaneous  - External ear exam 0 Lee Clark, BURGEN. ([id number])  - Specimen Collection (cultures, biopsies, blood, body fluids, etc.) 0  - Specimen(s) / Culture(s) sent or taken to Lab for analysis 0  - Patient Transfer (multiple staff / Michiel Sites Lift / Similar devices) 0  - Simple Staple / Suture removal (25 or less) 0  - Complex Staple / Suture removal (26 or more) 0  - Hypo / Hyperglycemic Management (close monitor of Blood Glucose) 0  - Ankle / Brachial Index (ABI) - do not check if billed separately 0 X - Vital Signs 1 5 Has the patient been seen at the hospital within the last three years: Yes Total Score: 75 Level Of Care: New/Established - Level 2 Electronic Signature(s) Signed: 01/18/2015 2:50:48 PM By: Curtis Sites Entered By: Curtis Sites on 01/18/2015 14:18:01 Lee Clark ([id number]) -------------------------------------------------------------------------------- Encounter Discharge Information Details Patient Name: Lee Clark Date of Service: 01/18/2015 2:15 PM Medical Record Number: [id number] Patient Account Number: [id number] Date of Birth/Sex: [birth year] (59 y.o. Male) Treating RN: Curtis Sites Primary Care Physician: [name] Other Clinician: Referring Physician: [name] Treating Physician/Extender: Elayne Snare in Treatment:  6 Encounter Discharge Information Items Discharge Pain Level: 0 Discharge Condition: Stable Ambulatory Status: Wheelchair Discharge Destination: Home Transportation: Private Auto Accompanied By: spouse Schedule Follow-up Appointment: Yes Medication Reconciliation completed and provided to Patient/Care No Gladyce Mcray: Provided on Clinical Summary of Care: 01/18/2015 Form Type Recipient Paper Patient TD Electronic Signature(s) Signed: 01/18/2015 2:56:27 PM By: Ardath Sax MD Previous Signature: 01/18/2015 2:34:06 PM Version By: Gwenlyn Perking Entered By: Ardath Sax on 01/18/2015 14:56:27 Lee Clark ([id number]) -------------------------------------------------------------------------------- Multi Wound Chart Details Patient Name: Lee Clark Date of Service: 01/18/2015 2:15 PM Medical Record Number: [id number] Patient Account Number: [id number] Date of Birth/Sex: 1955/09/11 (59 y.o. Male) Treating RN: Curtis Sites Primary Care Physician: [name] Other Clinician: Referring Physician: [name] Treating Physician/Extender: Ardath Sax Weeks in Treatment: 6 Vital Signs Height(in): 74 Pulse(bpm): 79 Weight(lbs): 140 Blood Pressure 127/77 (mmHg): Body Mass Index(BMI): 18 Temperature(F): 98.6 Respiratory Rate 18 (breaths/min): Photos: [1:No Photos] [N/A:N/A] Wound Location: [1:Right Gluteus] [N/A:N/A] Wounding Event: [1:Pressure Injury] [N/A:N/A] Primary Etiology: [1:Pressure Ulcer] [N/A:N/A] Comorbid History: [1:History of pressure wounds, Osteoarthritis] [N/A:N/A] Date Acquired: [1:11/01/2014] [N/A:N/A] Weeks of Treatment: [1:6] [N/A:N/A] Wound Status: [1:Open] [N/A:N/A] Measurements L x W x D 0.5x0.3x0.2 [N/A:N/A] (cm) Area (cm) : [1:0.118] [N/A:N/A] Volume (cm) : [1:0.024] [N/A:N/A] % Reduction in Area: [1:94.00%] [N/A:N/A] % Reduction in Volume: 93.90% [N/A:N/A] Classification: [1:Category/Stage III] [N/A:N/A] Exudate Amount:  [1:Medium] [N/A:N/A] Exudate Type: [1:Serosanguineous] [N/A:N/A] Exudate Color: [1:red, brown] [N/A:N/A] Wound Margin: [1:Distinct, outline attached] [N/A:N/A] Granulation Amount: [1:Large (67-100%)] [N/A:N/A] Granulation Quality: [1:Pink] [N/A:N/A] Necrotic Amount: [1:None Present (0%)] [N/A:N/A] Exposed Structures: [1:Fascia: No Fat: No Tendon: No Muscle: No Joint: No Bone: No] [N/A:N/A] Limited to Skin Breakdown Epithelialization: None N/A N/A Periwound Skin Texture: Edema: No N/A N/A Excoriation: No Induration: No Callus: No Crepitus: No Fluctuance: No Friable: No Rash: No Scarring: No Periwound Skin Moist: Yes N/A N/A Moisture: Maceration: No  Dry/Scaly: No Periwound Skin Color: Atrophie Blanche: No N/A N/A Cyanosis: No Ecchymosis: No Erythema: No Hemosiderin Staining: No Mottled: No Pallor: No Rubor: No Temperature: No Abnormality N/A N/A Tenderness on No N/A N/A Palpation: Wound Preparation: Ulcer Cleansing: N/A N/A Rinsed/Irrigated with Saline Topical Anesthetic Applied: None Treatment Notes Electronic Signature(s) Signed: 01/18/2015 2:50:48 PM By: Curtis Sites Entered By: Curtis Sites on 01/18/2015 14:11:06 Lee Clark ([id number]) -------------------------------------------------------------------------------- Multi-Disciplinary Care Plan Details Patient Name: Lee Clark Date of Service: 01/18/2015 2:15 PM Medical Record Number: [id number] Patient Account Number: [id number] Date of Birth/Sex: January 10, 1956 (59 y.o. Male) Treating RN: Curtis Sites Primary Care Physician: [name] Other Clinician: Referring Physician: [name] Treating Physician/Extender: Elayne Snare in Treatment: 6 Active Inactive Orientation to the Wound Care Program Nursing Diagnoses: Knowledge deficit related to the wound healing center program Goals: Patient/caregiver will verbalize understanding of the Wound Healing Center Program Date  Initiated: 12/01/2014 Goal Status: Active Interventions: Provide education on orientation to the wound center Notes: Pressure Nursing Diagnoses: Knowledge deficit related to causes and risk factors for pressure ulcer development Knowledge deficit related to management of pressures ulcers Potential for impaired tissue integrity related to pressure, friction, moisture, and shear Goals: Patient will remain free from development of additional pressure ulcers Date Initiated: 12/01/2014 Goal Status: Active Patient will remain free of pressure ulcers Date Initiated: 12/01/2014 Goal Status: Active Patient/caregiver will verbalize risk factors for pressure ulcer development Date Initiated: 12/01/2014 Goal Status: Active Patient/caregiver will verbalize understanding of pressure ulcer management Date Initiated: 12/01/2014 Goal Status: Active Interventions: Lee Clark, Lee Clark ([id number]) Assess: immobility, friction, shearing, incontinence upon admission and as needed Assess offloading mechanisms upon admission and as needed Assess potential for pressure ulcer upon admission and as needed Provide education on pressure ulcers Treatment Activities: Patient referred for home evaluation of offloading devices/mattresses : 01/18/2015 Patient referred for pressure reduction/relief devices : 01/18/2015 Patient referred for seating evaluation to ensure proper offloading : 01/18/2015 Pressure reduction/relief device ordered : 01/18/2015 Notes: Wound/Skin Impairment Nursing Diagnoses: Impaired tissue integrity Knowledge deficit related to ulceration/compromised skin integrity Goals: Patient/caregiver will verbalize understanding of skin care regimen Date Initiated: 12/01/2014 Goal Status: Active Ulcer/skin breakdown will have a volume reduction of 30% by week 4 Date Initiated: 12/01/2014 Goal Status: Active Ulcer/skin breakdown will have a volume reduction of 50% by week 8 Date Initiated:  12/01/2014 Goal Status: Active Ulcer/skin breakdown will have a volume reduction of 80% by week 12 Date Initiated: 12/01/2014 Goal Status: Active Ulcer/skin breakdown will heal within 14 weeks Date Initiated: 12/01/2014 Goal Status: Active Interventions: Assess patient/caregiver ability to perform ulcer/skin care regimen upon admission and as needed Assess ulceration(s) every visit Provide education on ulcer and skin care Treatment Activities: Skin care regimen initiated : 01/18/2015 Topical wound management initiated : 01/18/2015 Lee Clark, Lee Clark ([id number]) Notes: Electronic Signature(s) Signed: 01/18/2015 2:50:48 PM By: Curtis Sites Entered By: Curtis Sites on 01/18/2015 14:10:57 Lee Clark ([id number]) -------------------------------------------------------------------------------- Patient/Caregiver Education Details Patient Name: Lee Clark Date of Service: 01/18/2015 2:15 PM Medical Record Number: [id number] Patient Account Number: [id number] Date of Birth/Gender: 07-Jan-1956 (59 y.o. Male) Treating RN: Curtis Sites Primary Care Physician: [name] Other Clinician: Referring Physician: [name] Treating Physician/Extender: Elayne Snare in Treatment: 6 Education Assessment Education Provided To: Patient Education Topics Provided Wound/Skin Impairment: Handouts: Other: wound care as ordered Methods: Demonstration, Explain/Verbal Responses: State content correctly Electronic Signature(s) Signed: 01/18/2015 2:56:38 PM By: Ardath Sax MD Previous Signature: 01/18/2015 2:50:48 PM Version By: Curtis Sites Entered  By: Ardath Sax on 01/18/2015 14:56:38 Lee Clark ([id number]) -------------------------------------------------------------------------------- Wound Assessment Details Patient Name: Lee Clark, Lee Clark Date of Service: 01/18/2015 2:15 PM Medical Record Number: [id number] Patient Account Number: [id number] Date of  Birth/Sex: May 26, 1955 (59 y.o. Male) Treating RN: Curtis Sites Primary Care Physician: [name] Other Clinician: Referring Physician: [name] Treating Physician/Extender: Ardath Sax Weeks in Treatment: 6 Wound Status Wound Number: 1 Primary Pressure Ulcer Etiology: Wound Location: Right Gluteus Wound Status: Open Wounding Event: Pressure Injury Comorbid History of pressure wounds, Date Acquired: 11/01/2014 History: Osteoarthritis Weeks Of Treatment: 6 Clustered Wound: No Photos Photo Uploaded By: Curtis Sites on 01/18/2015 14:51:26 Wound Measurements Length: (cm) 0.5 Width: (cm) 0.3 Depth: (cm) 0.2 Area: (cm) 0.118 Volume: (cm) 0.024 % Reduction in Area: 94% % Reduction in Volume: 93.9% Epithelialization: None Tunneling: No Undermining: No Wound Description Classification: Category/Stage III Wound Margin: Distinct, outline attached Exudate Amount: Medium Exudate Type: Serosanguineous Exudate Color: red, brown Foul Odor After Cleansing: No Wound Bed Granulation Amount: Large (67-100%) Exposed Structure Granulation Quality: Pink Fascia Exposed: No Necrotic Amount: None Present (0%) Fat Layer Exposed: No Tendon Exposed: No Lee Clark, HASE. ([id number]) Muscle Exposed: No Joint Exposed: No Bone Exposed: No Limited to Skin Breakdown Periwound Skin Texture Texture Color No Abnormalities Noted: No No Abnormalities Noted: No Callus: No Atrophie Blanche: No Crepitus: No Cyanosis: No Excoriation: No Ecchymosis: No Fluctuance: No Erythema: No Friable: No Hemosiderin Staining: No Induration: No Mottled: No Localized Edema: No Pallor: No Rash: No Rubor: No Scarring: No Temperature / Pain Moisture Temperature: No Abnormality No Abnormalities Noted: No Dry / Scaly: No Maceration: No Moist: Yes Wound Preparation Ulcer Cleansing: Rinsed/Irrigated with Saline Topical Anesthetic Applied: None Treatment Notes Wound #1 (Right  Gluteus) 1. Cleansed with: Clean wound with Normal Saline 3. Peri-wound Care: Skin Prep 4. Dressing Applied: Prisma Ag 5. Secondary Dressing Applied Bordered Foam Dressing Notes Nurse, adult) Signed: 01/18/2015 2:50:48 PM By: Curtis Sites Entered By: Curtis Sites on 01/18/2015 14:10:45 Lee Clark ([id number]) -------------------------------------------------------------------------------- Vitals Details Patient Name: Lee Clark Date of Service: 01/18/2015 2:15 PM Medical Record Number: [id number] Patient Account Number: [id number] Date of Birth/Sex: January 14, 1956 (59 y.o. Male) Treating RN: Curtis Sites Primary Care Physician: [name] Other Clinician: Referring Physician: [name] Treating Physician/Extender: Elayne Snare in Treatment: 6 Vital Signs Time Taken: 13:55 Temperature (F): 98.6 Height (in): 74 Pulse (bpm): 79 Weight (lbs): 140 Respiratory Rate (breaths/min): 18 Body Mass Index (BMI): 18 Blood Pressure (mmHg): 127/77 Reference Range: 80 - 120 mg / dl Electronic Signature(s) Signed: 01/18/2015 2:50:48 PM By: Curtis Sites Entered By: Curtis Sites on 01/18/2015 14:00:18
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
## 2015-01-20 DIAGNOSIS — S34109S Unspecified injury to unspecified level of lumbar spinal cord, sequela: Secondary | ICD-10-CM | POA: Diagnosis not present

## 2015-01-20 DIAGNOSIS — F17210 Nicotine dependence, cigarettes, uncomplicated: Secondary | ICD-10-CM | POA: Diagnosis not present

## 2015-01-20 DIAGNOSIS — G8220 Paraplegia, unspecified: Secondary | ICD-10-CM | POA: Diagnosis not present

## 2015-01-20 DIAGNOSIS — F329 Major depressive disorder, single episode, unspecified: Secondary | ICD-10-CM | POA: Diagnosis not present

## 2015-01-20 DIAGNOSIS — Y249XXS Unspecified firearm discharge, undetermined intent, sequela: Secondary | ICD-10-CM | POA: Diagnosis not present

## 2015-01-20 DIAGNOSIS — L89312 Pressure ulcer of right buttock, stage 2: Secondary | ICD-10-CM | POA: Diagnosis not present

## 2015-01-22 DIAGNOSIS — F17210 Nicotine dependence, cigarettes, uncomplicated: Secondary | ICD-10-CM | POA: Diagnosis not present

## 2015-01-22 DIAGNOSIS — S34109S Unspecified injury to unspecified level of lumbar spinal cord, sequela: Secondary | ICD-10-CM | POA: Diagnosis not present

## 2015-01-22 DIAGNOSIS — G8220 Paraplegia, unspecified: Secondary | ICD-10-CM | POA: Diagnosis not present

## 2015-01-22 DIAGNOSIS — Y249XXS Unspecified firearm discharge, undetermined intent, sequela: Secondary | ICD-10-CM | POA: Diagnosis not present

## 2015-01-22 DIAGNOSIS — L89312 Pressure ulcer of right buttock, stage 2: Secondary | ICD-10-CM | POA: Diagnosis not present

## 2015-01-22 DIAGNOSIS — F329 Major depressive disorder, single episode, unspecified: Secondary | ICD-10-CM | POA: Diagnosis not present

## 2015-01-26 ENCOUNTER — Encounter: Payer: Medicare Other | Attending: Surgery | Admitting: Surgery

## 2015-01-26 DIAGNOSIS — G8222 Paraplegia, incomplete: Secondary | ICD-10-CM | POA: Insufficient documentation

## 2015-01-26 DIAGNOSIS — L89313 Pressure ulcer of right buttock, stage 3: Secondary | ICD-10-CM | POA: Insufficient documentation

## 2015-01-26 DIAGNOSIS — F17218 Nicotine dependence, cigarettes, with other nicotine-induced disorders: Secondary | ICD-10-CM | POA: Insufficient documentation

## 2015-01-26 NOTE — Progress Notes (Signed)
Lee Clark, Lee Clark ([id number]) Visit Report for 01/26/2015 Arrival Information Details Patient Name: Lee Clark, Lee Clark Date of Service: 01/26/2015 2:45 PM Medical Record Number: [id number] Patient Account Number: [id number] Date of Birth/Sex: [birth year] (59 y.o. Male) Treating RN: Curtis Sites Primary Care Physician: [name] Other Clinician: Referring Physician: [name] Treating Physician/Extender: Rudene Re in Treatment: 8 Visit Information History Since Last Visit Added or deleted any medications: No Patient Arrived: Wheel Chair Any new allergies or adverse reactions: No Arrival Time: 14:57 Had a fall or experienced change in No Accompanied By: spouse activities of daily living that may affect Transfer Assistance: Michiel Sites Lift risk of falls: Patient Identification Verified: Yes Signs or symptoms of abuse/neglect since last No Secondary Verification Process Yes visito Completed: Hospitalized since last visit: No Patient Requires Transmission- No Pain Present Now: No Based Precautions: Patient Has Alerts: Yes Patient Alerts: Patient on Blood Thinner Electronic Signature(s) Signed: 01/26/2015 4:44:35 PM By: Curtis Sites Entered By: Curtis Sites on 01/26/2015 14:58:08 Lee Clark ([id number]) -------------------------------------------------------------------------------- Clinic Level of Care Assessment Details Patient Name: Lee Clark Date of Service: 01/26/2015 2:45 PM Medical Record Number: [id number] Patient Account Number: [id number] Date of Birth/Sex: [birth year] (59 y.o. Male) Treating RN: Curtis Sites Primary Care Physician: [name] Other Clinician: Referring Physician: [name] Treating Physician/Extender: Rudene Re in Treatment: 8 Clinic Level of Care Assessment Items TOOL 4 Quantity Score []  - Use when only an EandM is performed on FOLLOW-UP visit 0 ASSESSMENTS - Nursing Assessment / Reassessment X -  Reassessment of Co-morbidities (includes updates in patient status) 1 10 X - Reassessment of Adherence to Treatment Plan 1 5 ASSESSMENTS - Wound and Skin Assessment / Reassessment X - Simple Wound Assessment / Reassessment - one wound 1 5 []  - Complex Wound Assessment / Reassessment - multiple wounds 0 []  - Dermatologic / Skin Assessment (not related to wound area) 0 ASSESSMENTS - Focused Assessment []  - Circumferential Edema Measurements - multi extremities 0 []  - Nutritional Assessment / Counseling / Intervention 0 []  - Lower Extremity Assessment (monofilament, tuning fork, pulses) 0 []  - Peripheral Arterial Disease Assessment (using hand held doppler) 0 ASSESSMENTS - Ostomy and/or Continence Assessment and Care []  - Incontinence Assessment and Management 0 []  - Ostomy Care Assessment and Management (repouching, etc.) 0 PROCESS - Coordination of Care X - Simple Patient / Family Education for ongoing care 1 15 []  - Complex (extensive) Patient / Family Education for ongoing care 0 []  - Staff obtains Chiropractor, Records, Test Results / Process Orders 0 []  - Staff telephones HHA, Nursing Homes / Clarify orders / etc 0 []  - Routine Transfer to another Facility (non-emergent condition) 0 Lee Clark, Lee Clark ([id number]) []  - Routine Hospital Admission (non-emergent condition) 0 []  - New Admissions / Manufacturing engineer / Ordering NPWT, Apligraf, etc. 0 []  - Emergency Hospital Admission (emergent condition) 0 X - Simple Discharge Coordination 1 10 []  - Complex (extensive) Discharge Coordination 0 PROCESS - Special Needs []  - Pediatric / Minor Patient Management 0 []  - Isolation Patient Management 0 []  - Hearing / Language / Visual special needs 0 []  - Assessment of Community assistance (transportation, D/C planning, etc.) 0 []  - Additional assistance / Altered mentation 0 []  - Support Surface(s) Assessment (bed, cushion, seat, etc.) 0 INTERVENTIONS - Wound Cleansing / Measurement X -  Simple Wound Cleansing - one wound 1 5 []  - Complex Wound Cleansing - multiple wounds 0 X - Wound Imaging (photographs - any number of wounds) 1 5 []  -  Wound Tracing (instead of photographs) 0 X - Simple Wound Measurement - one wound 1 5 []  - Complex Wound Measurement - multiple wounds 0 INTERVENTIONS - Wound Dressings X - Small Wound Dressing one or multiple wounds 1 10 []  - Medium Wound Dressing one or multiple wounds 0 []  - Large Wound Dressing one or multiple wounds 0 []  - Application of Medications - topical 0 []  - Application of Medications - injection 0 INTERVENTIONS - Miscellaneous []  - External ear exam 0 Lee Clark, Lee Clark. ([id number]) []  - Specimen Collection (cultures, biopsies, blood, body fluids, etc.) 0 []  - Specimen(s) / Culture(s) sent or taken to Lab for analysis 0 []  - Patient Transfer (multiple staff / Michiel Sites Lift / Similar devices) 0 []  - Simple Staple / Suture removal (25 or less) 0 []  - Complex Staple / Suture removal (26 or more) 0 []  - Hypo / Hyperglycemic Management (close monitor of Blood Glucose) 0 []  - Ankle / Brachial Index (ABI) - do not check if billed separately 0 X - Vital Signs 1 5 Has the patient been seen at the hospital within the last three years: Yes Total Score: 75 Level Of Care: New/Established - Level 2 Electronic Signature(s) Signed: 01/26/2015 4:44:35 PM By: Curtis Sites Entered By: Curtis Sites on 01/26/2015 15:18:11 Lee Clark ([id number]) -------------------------------------------------------------------------------- Encounter Discharge Information Details Patient Name: Lee Clark Date of Service: 01/26/2015 2:45 PM Medical Record Number: [id number] Patient Account Number: [id number] Date of Birth/Sex: [birth year] (59 y.o. Male) Treating RN: Curtis Sites Primary Care Physician: [name] Other Clinician: Referring Physician: [name] Treating Physician/Extender: Rudene Re in Treatment: 8 Encounter  Discharge Information Items Discharge Pain Level: 0 Discharge Condition: Stable Ambulatory Status: Wheelchair Discharge Destination: Home Private Transportation: Auto Accompanied By: spouse Schedule Follow-up Appointment: Yes Medication Reconciliation completed and No provided to Patient/Care Lachlan Mckim: Clinical Summary of Care: Electronic Signature(s) Signed: 01/26/2015 4:44:35 PM By: Curtis Sites Entered By: Curtis Sites on 01/26/2015 15:26:37 Lee Clark ([id number]) -------------------------------------------------------------------------------- Multi Wound Chart Details Patient Name: Lee Clark Date of Service: 01/26/2015 2:45 PM Medical Record Number: [id number] Patient Account Number: [id number] Date of Birth/Sex: 12/01/1955 (59 y.o. Male) Treating RN: Curtis Sites Primary Care Physician: [name] Other Clinician: Referring Physician: [name] Treating Physician/Extender: Rudene Re in Treatment: 8 Vital Signs Height(in): 74 Pulse(bpm): 87 Weight(lbs): 140 Blood Pressure 123/71 (mmHg): Body Mass Index(BMI): 18 Temperature(F): 98.7 Respiratory Rate 16 (breaths/min): Photos: [1:No Photos] [N/A:N/A] Wound Location: [1:Right Gluteus] [N/A:N/A] Wounding Event: [1:Pressure Injury] [N/A:N/A] Primary Etiology: [1:Pressure Ulcer] [N/A:N/A] Comorbid History: [1:History of pressure wounds, Osteoarthritis] [N/A:N/A] Date Acquired: [1:11/01/2014] [N/A:N/A] Weeks of Treatment: [1:8] [N/A:N/A] Wound Status: [1:Open] [N/A:N/A] Measurements L x W x D 0.5x0.1x0.1 [N/A:N/A] (cm) Area (cm) : [1:0.039] [N/A:N/A] Volume (cm) : [1:0.004] [N/A:N/A] % Reduction in Area: [1:98.00%] [N/A:N/A] % Reduction in Volume: 99.00% [N/A:N/A] Classification: [1:Category/Stage III] [N/A:N/A] Exudate Amount: [1:Medium] [N/A:N/A] Exudate Type: [1:Serosanguineous] [N/A:N/A] Exudate Color: [1:red, brown] [N/A:N/A] Wound Margin: [1:Distinct, outline  attached] [N/A:N/A] Granulation Amount: [1:Large (67-100%)] [N/A:N/A] Granulation Quality: [1:Pink] [N/A:N/A] Necrotic Amount: [1:None Present (0%)] [N/A:N/A] Exposed Structures: [1:Fascia: No Fat: No Tendon: No Muscle: No Joint: No Bone: No] [N/A:N/A] Limited to Skin Breakdown Epithelialization: None N/A N/A Periwound Skin Texture: Edema: No N/A N/A Excoriation: No Induration: No Callus: No Crepitus: No Fluctuance: No Friable: No Rash: No Scarring: No Periwound Skin Moist: Yes N/A N/A Moisture: Maceration: No Dry/Scaly: No Periwound Skin Color: Atrophie Blanche: No N/A N/A Cyanosis: No Ecchymosis: No Erythema: No Hemosiderin Staining: No  Mottled: No Pallor: No Rubor: No Temperature: No Abnormality N/A N/A Tenderness on No N/A N/A Palpation: Wound Preparation: Ulcer Cleansing: N/A N/A Rinsed/Irrigated with Saline Topical Anesthetic Applied: None Treatment Notes Electronic Signature(s) Signed: 01/26/2015 4:44:35 PM By: Curtis Sites Entered By: Curtis Sites on 01/26/2015 15:12:42 Lee Clark ([id number]) -------------------------------------------------------------------------------- Multi-Disciplinary Care Plan Details Patient Name: Lee Clark Date of Service: 01/26/2015 2:45 PM Medical Record Number: [id number] Patient Account Number: [id number] Date of Birth/Sex: Mar 10, 1956 (59 y.o. Male) Treating RN: Curtis Sites Primary Care Physician: [name] Other Clinician: Referring Physician: [name] Treating Physician/Extender: Rudene Re in Treatment: 8 Active Inactive Orientation to the Wound Care Program Nursing Diagnoses: Knowledge deficit related to the wound healing center program Goals: Patient/caregiver will verbalize understanding of the Wound Healing Center Program Date Initiated: 12/01/2014 Goal Status: Active Interventions: Provide education on orientation to the wound center Notes: Pressure Nursing  Diagnoses: Knowledge deficit related to causes and risk factors for pressure ulcer development Knowledge deficit related to management of pressures ulcers Potential for impaired tissue integrity related to pressure, friction, moisture, and shear Goals: Patient will remain free from development of additional pressure ulcers Date Initiated: 12/01/2014 Goal Status: Active Patient will remain free of pressure ulcers Date Initiated: 12/01/2014 Goal Status: Active Patient/caregiver will verbalize risk factors for pressure ulcer development Date Initiated: 12/01/2014 Goal Status: Active Patient/caregiver will verbalize understanding of pressure ulcer management Date Initiated: 12/01/2014 Goal Status: Active Interventions: Lee Clark, Lee Clark ([id number]) Assess: immobility, friction, shearing, incontinence upon admission and as needed Assess offloading mechanisms upon admission and as needed Assess potential for pressure ulcer upon admission and as needed Provide education on pressure ulcers Treatment Activities: Patient referred for home evaluation of offloading devices/mattresses : 01/26/2015 Patient referred for pressure reduction/relief devices : 01/26/2015 Patient referred for seating evaluation to ensure proper offloading : 01/26/2015 Pressure reduction/relief device ordered : 01/26/2015 Notes: Wound/Skin Impairment Nursing Diagnoses: Impaired tissue integrity Knowledge deficit related to ulceration/compromised skin integrity Goals: Patient/caregiver will verbalize understanding of skin care regimen Date Initiated: 12/01/2014 Goal Status: Active Ulcer/skin breakdown will have a volume reduction of 30% by week 4 Date Initiated: 12/01/2014 Goal Status: Active Ulcer/skin breakdown will have a volume reduction of 50% by week 8 Date Initiated: 12/01/2014 Goal Status: Active Ulcer/skin breakdown will have a volume reduction of 80% by week 12 Date Initiated: 12/01/2014 Goal Status:  Active Ulcer/skin breakdown will heal within 14 weeks Date Initiated: 12/01/2014 Goal Status: Active Interventions: Assess patient/caregiver ability to perform ulcer/skin care regimen upon admission and as needed Assess ulceration(s) every visit Provide education on ulcer and skin care Treatment Activities: Skin care regimen initiated : 01/26/2015 Topical wound management initiated : 01/26/2015 Lee Clark, Lee Clark ([id number]) Notes: Electronic Signature(s) Signed: 01/26/2015 4:44:35 PM By: Curtis Sites Entered By: Curtis Sites on 01/26/2015 15:12:30 Lee Clark ([id number]) -------------------------------------------------------------------------------- Patient/Caregiver Education Details Patient Name: Lee Clark Date of Service: 01/26/2015 2:45 PM Medical Record Number: [id number] Patient Account Number: [id number] Date of Birth/Gender: Apr 15, 1956 (59 y.o. Male) Treating RN: Curtis Sites Primary Care Physician: [name] Other Clinician: Referring Physician: [name] Treating Physician/Extender: Rudene Re in Treatment: 8 Education Assessment Education Provided To: Patient and Caregiver Education Topics Provided Wound/Skin Impairment: Handouts: Smoking and Wound Healing, Other: wound care as ordered Methods: Demonstration, Explain/Verbal Responses: State content correctly Electronic Signature(s) Signed: 01/26/2015 4:44:35 PM By: Curtis Sites Entered By: Curtis Sites on 01/26/2015 15:17:41 Lee Clark ([id number]) -------------------------------------------------------------------------------- Wound Assessment Details Patient Name: Lee Clark Date of Service: 01/26/2015  2:45 PM Medical Record Number: [id number] Patient Account Number: [id number] Date of Birth/Sex: 07-17-1955 (59 y.o. Male) Treating RN: Curtis Sites Primary Care Physician: [name] Other Clinician: Referring Physician: [name] Treating  Physician/Extender: Rudene Re in Treatment: 8 Wound Status Wound Number: 1 Primary Pressure Ulcer Etiology: Wound Location: Right Gluteus Wound Status: Open Wounding Event: Pressure Injury Comorbid History of pressure wounds, Date Acquired: 11/01/2014 History: Osteoarthritis Weeks Of Treatment: 8 Clustered Wound: No Photos Photo Uploaded By: Curtis Sites on 01/26/2015 16:43:27 Wound Measurements Length: (cm) 0.3 Width: (cm) 0.1 Depth: (cm) 0.1 Area: (cm) 0.024 Volume: (cm) 0.002 % Reduction in Area: 98.8% % Reduction in Volume: 99.5% Epithelialization: None Tunneling: No Undermining: No Wound Description Classification: Category/Stage III Wound Margin: Distinct, outline attached Exudate Amount: Medium Exudate Type: Serosanguineous Exudate Color: red, brown Foul Odor After Cleansing: No Wound Bed Granulation Amount: Large (67-100%) Exposed Structure Granulation Quality: Pink Fascia Exposed: No Necrotic Amount: None Present (0%) Fat Layer Exposed: No Tendon Exposed: No Lee Clark, Lee Clark. ([id number]) Muscle Exposed: No Joint Exposed: No Bone Exposed: No Limited to Skin Breakdown Periwound Skin Texture Texture Color No Abnormalities Noted: No No Abnormalities Noted: No Callus: No Atrophie Blanche: No Crepitus: No Cyanosis: No Excoriation: No Ecchymosis: No Fluctuance: No Erythema: No Friable: No Hemosiderin Staining: No Induration: No Mottled: No Localized Edema: No Pallor: No Rash: No Rubor: No Scarring: No Temperature / Pain Moisture Temperature: No Abnormality No Abnormalities Noted: No Dry / Scaly: No Maceration: No Moist: Yes Wound Preparation Ulcer Cleansing: Rinsed/Irrigated with Saline Topical Anesthetic Applied: None Treatment Notes Wound #1 (Right Gluteus) 1. Cleansed with: Clean wound with Normal Saline 2. Anesthetic Topical Lidocaine 4% cream to wound bed prior to debridement 3. Peri-wound Care: Skin Prep 4.  Dressing Applied: Prisma Ag 5. Secondary Dressing Applied Bordered Foam Dressing Notes Nurse, adult) Signed: 01/26/2015 4:44:35 PM By: Curtis Sites Entered By: Curtis Sites on 01/26/2015 15:16:42 Lee Clark ([id number]) Lee Clark, Lee Clark ([id number]) -------------------------------------------------------------------------------- Vitals Details Patient Name: Lee Clark Date of Service: 01/26/2015 2:45 PM Medical Record Number: [id number] Patient Account Number: [id number] Date of Birth/Sex: [birth year] (59 y.o. Male) Treating RN: Curtis Sites Primary Care Physician: [name] Other Clinician: Referring Physician: [name] Treating Physician/Extender: Rudene Re in Treatment: 8 Vital Signs Time Taken: 14:59 Temperature (F): 98.7 Height (in): 74 Pulse (bpm): 87 Weight (lbs): 140 Respiratory Rate (breaths/min): 16 Body Mass Index (BMI): 18 Blood Pressure (mmHg): 123/71 Reference Range: 80 - 120 mg / dl Electronic Signature(s) Signed: 01/26/2015 4:44:35 PM By: Curtis Sites Entered By: Curtis Sites on 01/26/2015 15:00:40
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
## 2015-01-28 DIAGNOSIS — S34109S Unspecified injury to unspecified level of lumbar spinal cord, sequela: Secondary | ICD-10-CM | POA: Diagnosis not present

## 2015-01-28 DIAGNOSIS — G8220 Paraplegia, unspecified: Secondary | ICD-10-CM | POA: Diagnosis not present

## 2015-01-28 DIAGNOSIS — F329 Major depressive disorder, single episode, unspecified: Secondary | ICD-10-CM | POA: Diagnosis not present

## 2015-01-28 DIAGNOSIS — F17210 Nicotine dependence, cigarettes, uncomplicated: Secondary | ICD-10-CM | POA: Diagnosis not present

## 2015-01-28 DIAGNOSIS — L89312 Pressure ulcer of right buttock, stage 2: Secondary | ICD-10-CM | POA: Diagnosis not present

## 2015-01-28 DIAGNOSIS — Y249XXS Unspecified firearm discharge, undetermined intent, sequela: Secondary | ICD-10-CM | POA: Diagnosis not present

## 2015-02-01 DIAGNOSIS — S82102D Unspecified fracture of upper end of left tibia, subsequent encounter for closed fracture with routine healing: Secondary | ICD-10-CM | POA: Diagnosis not present

## 2015-02-03 DIAGNOSIS — Y249XXS Unspecified firearm discharge, undetermined intent, sequela: Secondary | ICD-10-CM | POA: Diagnosis not present

## 2015-02-03 DIAGNOSIS — F17210 Nicotine dependence, cigarettes, uncomplicated: Secondary | ICD-10-CM | POA: Diagnosis not present

## 2015-02-03 DIAGNOSIS — S34109S Unspecified injury to unspecified level of lumbar spinal cord, sequela: Secondary | ICD-10-CM | POA: Diagnosis not present

## 2015-02-03 DIAGNOSIS — L89312 Pressure ulcer of right buttock, stage 2: Secondary | ICD-10-CM | POA: Diagnosis not present

## 2015-02-03 DIAGNOSIS — G8220 Paraplegia, unspecified: Secondary | ICD-10-CM | POA: Diagnosis not present

## 2015-02-03 DIAGNOSIS — F329 Major depressive disorder, single episode, unspecified: Secondary | ICD-10-CM | POA: Diagnosis not present

## 2015-02-05 DIAGNOSIS — Y249XXS Unspecified firearm discharge, undetermined intent, sequela: Secondary | ICD-10-CM | POA: Diagnosis not present

## 2015-02-05 DIAGNOSIS — S34109S Unspecified injury to unspecified level of lumbar spinal cord, sequela: Secondary | ICD-10-CM | POA: Diagnosis not present

## 2015-02-05 DIAGNOSIS — F17210 Nicotine dependence, cigarettes, uncomplicated: Secondary | ICD-10-CM | POA: Diagnosis not present

## 2015-02-05 DIAGNOSIS — G8220 Paraplegia, unspecified: Secondary | ICD-10-CM | POA: Diagnosis not present

## 2015-02-05 DIAGNOSIS — F329 Major depressive disorder, single episode, unspecified: Secondary | ICD-10-CM | POA: Diagnosis not present

## 2015-02-05 DIAGNOSIS — L89312 Pressure ulcer of right buttock, stage 2: Secondary | ICD-10-CM | POA: Diagnosis not present

## 2015-02-08 ENCOUNTER — Encounter: Payer: Medicare Other | Admitting: Surgery

## 2015-02-08 DIAGNOSIS — G8222 Paraplegia, incomplete: Secondary | ICD-10-CM | POA: Diagnosis not present

## 2015-02-08 DIAGNOSIS — F17218 Nicotine dependence, cigarettes, with other nicotine-induced disorders: Secondary | ICD-10-CM | POA: Diagnosis not present

## 2015-02-08 DIAGNOSIS — L89313 Pressure ulcer of right buttock, stage 3: Secondary | ICD-10-CM | POA: Diagnosis not present

## 2015-02-09 NOTE — Progress Notes (Signed)
TRICE, ASPINALL ([id number]) Visit Report for 02/08/2015 Arrival Information Details Patient Name: Lee Clark, Lee Clark Date of Service: 02/08/2015 2:15 PM Medical Record Number: [id number] Patient Account Number: [id number] Date of Birth/Sex: [birth year] (59 y.o. Male) Treating RN: Curtis Sites Primary Care Physician: [name] Other Clinician: Referring Physician: [name] Treating Physician/Extender: Rudene Re in Treatment: 9 Visit Information History Since Last Visit Added or deleted any medications: No Patient Arrived: Wheel Chair Any new allergies or adverse reactions: No Arrival Time: 14:16 Had a fall or experienced change in No Accompanied By: spouse activities of daily living that may affect Transfer Assistance: Michiel Sites Lift risk of falls: Patient Identification Verified: Yes Signs or symptoms of abuse/neglect since last No Secondary Verification Process Yes visito Completed: Hospitalized since last visit: No Patient Requires Transmission- No Pain Present Now: No Based Precautions: Patient Has Alerts: Yes Patient Alerts: Patient on Blood Thinner Electronic Signature(s) Signed: 02/08/2015 3:26:09 PM By: Curtis Sites Entered By: Curtis Sites on 02/08/2015 14:16:55 Lee Clark ([id number]) -------------------------------------------------------------------------------- Clinic Level of Care Assessment Details Patient Name: Lee Clark Date of Service: 02/08/2015 2:15 PM Medical Record Number: [id number] Patient Account Number: [id number] Date of Birth/Sex: [birth year] (59 y.o. Male) Treating RN: Huel Coventry Primary Care Physician: [name] Other Clinician: Referring Physician: [name] Treating Physician/Extender: Rudene Re in Treatment: 9 Clinic Level of Care Assessment Items TOOL 4 Quantity Score  - Use when only an EandM is performed on FOLLOW-UP visit 0 ASSESSMENTS - Nursing Assessment / Reassessment  -  Reassessment of Co-morbidities (includes updates in patient status) 0 X - Reassessment of Adherence to Treatment Plan 1 5 ASSESSMENTS - Wound and Skin Assessment / Reassessment X - Simple Wound Assessment / Reassessment - one wound 1 5  - Complex Wound Assessment / Reassessment - multiple wounds 0  - Dermatologic / Skin Assessment (not related to wound area) 0 ASSESSMENTS - Focused Assessment  - Circumferential Edema Measurements - multi extremities 0  - Nutritional Assessment / Counseling / Intervention 0  - Lower Extremity Assessment (monofilament, tuning fork, pulses) 0  - Peripheral Arterial Disease Assessment (using hand held doppler) 0 ASSESSMENTS - Ostomy and/or Continence Assessment and Care  - Incontinence Assessment and Management 0  - Ostomy Care Assessment and Management (repouching, etc.) 0 PROCESS - Coordination of Care X - Simple Patient / Family Education for ongoing care 1 15  - Complex (extensive) Patient / Family Education for ongoing care 0  - Staff obtains Chiropractor, Records, Test Results / Process Orders 0  - Staff telephones HHA, Nursing Homes / Clarify orders / etc 0  - Routine Transfer to another Facility (non-emergent condition) 0 Lee Clark, Lee Clark. ([id number])  - Routine Hospital Admission (non-emergent condition) 0  - New Admissions / Manufacturing engineer / Ordering NPWT, Apligraf, etc. 0  - Emergency Hospital Admission (emergent condition) 0 X - Simple Discharge Coordination 1 10  - Complex (extensive) Discharge Coordination 0 PROCESS - Special Needs  - Pediatric / Minor Patient Management 0  - Isolation Patient Management 0  - Hearing / Language / Visual special needs 0  - Assessment of Community assistance (transportation, D/C planning, etc.) 0  - Additional assistance / Altered mentation 0  - Support Surface(s) Assessment (bed, cushion, seat, etc.) 0 INTERVENTIONS - Wound Cleansing / Measurement X - Simple  Wound Cleansing - one wound 1 5  - Complex Wound Cleansing - multiple wounds 0 X - Wound Imaging (photographs - any number of wounds) 1 5  -  Wound Tracing (instead of photographs) 0 X - Simple Wound Measurement - one wound 1 5  - Complex Wound Measurement - multiple wounds 0 INTERVENTIONS - Wound Dressings X - Small Wound Dressing one or multiple wounds 1 10  - Medium Wound Dressing one or multiple wounds 0  - Large Wound Dressing one or multiple wounds 0  - Application of Medications - topical 0  - Application of Medications - injection 0 INTERVENTIONS - Miscellaneous  - External ear exam 0 Lee Clark, Lee Clark ([id number])  - Specimen Collection (cultures, biopsies, blood, body fluids, etc.) 0  - Specimen(s) / Culture(s) sent or taken to Lab for analysis 0  - Patient Transfer (multiple staff / Michiel Sites Lift / Similar devices) 0  - Simple Staple / Suture removal (25 or less) 0  - Complex Staple / Suture removal (26 or more) 0  - Hypo / Hyperglycemic Management (close monitor of Blood Glucose) 0  - Ankle / Brachial Index (ABI) - do not check if billed separately 0 X - Vital Signs 1 5 Has the patient been seen at the hospital within the last three years: Yes Total Score: 65 Level Of Care: New/Established - Level 2 Electronic Signature(s) Signed: 02/08/2015 4:49:24 PM By: Elliot Gurney, RN, BSN, Kim RN, BSN Entered By: Elliot Gurney, RN, BSN, Kim on 02/08/2015 14:35:56 Lee Clark, Lee Clark ([id number]) -------------------------------------------------------------------------------- Encounter Discharge Information Details Patient Name: Lee Clark Date of Service: 02/08/2015 2:15 PM Medical Record Number: [id number] Patient Account Number: [id number] Date of Birth/Sex: [birth year] (59 y.o. Male) Treating RN: Curtis Sites Primary Care Physician: [name] Other Clinician: Referring Physician: [name] Treating Physician/Extender: Rudene Re in Treatment:  9 Encounter Discharge Information Items Discharge Pain Level: 0 Discharge Condition: Stable Ambulatory Status: Wheelchair Discharge Destination: Home Private Transportation: Auto Accompanied By: spouse Schedule Follow-up Appointment: Yes Medication Reconciliation completed and No provided to Patient/Care Kendrik Mcshan: Clinical Summary of Care: Electronic Signature(s) Signed: 02/08/2015 3:26:09 PM By: Curtis Sites Entered By: Curtis Sites on 02/08/2015 14:39:08 Lee Clark ([id number]) -------------------------------------------------------------------------------- Multi Wound Chart Details Patient Name: Lee Clark Date of Service: 02/08/2015 2:15 PM Medical Record Number: [id number] Patient Account Number: [id number] Date of Birth/Sex: [birth year] (59 y.o. Male) Treating RN: Huel Coventry Primary Care Physician: [name] Other Clinician: Referring Physician: [name] Treating Physician/Extender: Rudene Re in Treatment: 9 Vital Signs Height(in): 74 Pulse(bpm): 86 Weight(lbs): 140 Blood Pressure 109/60 (mmHg): Body Mass Index(BMI): 18 Temperature(F): 98.4 Respiratory Rate 18 (breaths/min): Photos: [1:No Photos] [N/A:N/A] Wound Location: [1:Right Gluteus] [N/A:N/A] Wounding Event: [1:Pressure Injury] [N/A:N/A] Primary Etiology: [1:Pressure Ulcer] [N/A:N/A] Comorbid History: [1:History of pressure wounds, Osteoarthritis] [N/A:N/A] Date Acquired: [1:11/01/2014] [N/A:N/A] Weeks of Treatment: [1:9] [N/A:N/A] Wound Status: [1:Open] [N/A:N/A] Measurements L x W x D 0.1x0.1x0.1 [N/A:N/A] (cm) Area (cm) : [1:0.008] [N/A:N/A] Volume (cm) : [1:0.001] [N/A:N/A] % Reduction in Area: [1:99.60%] [N/A:N/A] % Reduction in Volume: 99.70% [N/A:N/A] Classification: [1:Category/Stage III] [N/A:N/A] Exudate Amount: [1:Medium] [N/A:N/A] Exudate Type: [1:Serosanguineous] [N/A:N/A] Exudate Color: [1:red, brown] [N/A:N/A] Wound Margin: [1:Distinct,  outline attached] [N/A:N/A] Granulation Amount: [1:Large (67-100%)] [N/A:N/A] Granulation Quality: [1:Pink] [N/A:N/A] Necrotic Amount: [1:None Present (0%)] [N/A:N/A] Exposed Structures: [1:Fascia: No Fat: No Tendon: No Muscle: No Joint: No Bone: No] [N/A:N/A] Limited to Skin Breakdown Epithelialization: Medium (34-66%) N/A N/A Periwound Skin Texture: Edema: No N/A N/A Excoriation: No Induration: No Callus: No Crepitus: No Fluctuance: No Friable: No Rash: No Scarring: No Periwound Skin Moist: Yes N/A N/A Moisture: Maceration: No Dry/Scaly: No Periwound Skin Color: Atrophie Blanche: No N/A N/A Cyanosis: No  Ecchymosis: No Erythema: No Hemosiderin Staining: No Mottled: No Pallor: No Rubor: No Temperature: No Abnormality N/A N/A Tenderness on No N/A N/A Palpation: Wound Preparation: Ulcer Cleansing: N/A N/A Rinsed/Irrigated with Saline Topical Anesthetic Applied: None Treatment Notes Electronic Signature(s) Signed: 02/08/2015 4:49:24 PM By: Elliot Gurney, RN, BSN, Kim RN, BSN Entered By: Elliot Gurney, RN, BSN, Kim on 02/08/2015 14:32:35 Lee Clark ([id number]) -------------------------------------------------------------------------------- Multi-Disciplinary Care Plan Details Patient Name: Lee Clark Date of Service: 02/08/2015 2:15 PM Medical Record Number: [id number] Patient Account Number: [id number] Date of Birth/Sex: 10-12-1955 (59 y.o. Male) Treating RN: Huel Coventry Primary Care Physician: [name] Other Clinician: Referring Physician: [name] Treating Physician/Extender: Rudene Re in Treatment: 9 Active Inactive Orientation to the Wound Care Program Nursing Diagnoses: Knowledge deficit related to the wound healing center program Goals: Patient/caregiver will verbalize understanding of the Wound Healing Center Program Date Initiated: 12/01/2014 Goal Status: Active Interventions: Provide education on orientation to the wound  center Notes: Pressure Nursing Diagnoses: Knowledge deficit related to causes and risk factors for pressure ulcer development Knowledge deficit related to management of pressures ulcers Potential for impaired tissue integrity related to pressure, friction, moisture, and shear Goals: Patient will remain free from development of additional pressure ulcers Date Initiated: 12/01/2014 Goal Status: Active Patient will remain free of pressure ulcers Date Initiated: 12/01/2014 Goal Status: Active Patient/caregiver will verbalize risk factors for pressure ulcer development Date Initiated: 12/01/2014 Goal Status: Active Patient/caregiver will verbalize understanding of pressure ulcer management Date Initiated: 12/01/2014 Goal Status: Active Interventions: Lee Clark, Lee Clark ([id number]) Assess: immobility, friction, shearing, incontinence upon admission and as needed Assess offloading mechanisms upon admission and as needed Assess potential for pressure ulcer upon admission and as needed Provide education on pressure ulcers Treatment Activities: Patient referred for home evaluation of offloading devices/mattresses : 02/08/2015 Patient referred for pressure reduction/relief devices : 02/08/2015 Patient referred for seating evaluation to ensure proper offloading : 02/08/2015 Pressure reduction/relief device ordered : 02/08/2015 Notes: Wound/Skin Impairment Nursing Diagnoses: Impaired tissue integrity Knowledge deficit related to ulceration/compromised skin integrity Goals: Patient/caregiver will verbalize understanding of skin care regimen Date Initiated: 12/01/2014 Goal Status: Active Ulcer/skin breakdown will have a volume reduction of 30% by week 4 Date Initiated: 12/01/2014 Goal Status: Active Ulcer/skin breakdown will have a volume reduction of 50% by week 8 Date Initiated: 12/01/2014 Goal Status: Active Ulcer/skin breakdown will have a volume reduction of 80% by week 12 Date Initiated:  12/01/2014 Goal Status: Active Ulcer/skin breakdown will heal within 14 weeks Date Initiated: 12/01/2014 Goal Status: Active Interventions: Assess patient/caregiver ability to perform ulcer/skin care regimen upon admission and as needed Assess ulceration(s) every visit Provide education on ulcer and skin care Treatment Activities: Skin care regimen initiated : 02/08/2015 Topical wound management initiated : 02/08/2015 Lee Clark, Lee Clark ([id number]) Notes: Electronic Signature(s) Signed: 02/08/2015 4:49:24 PM By: Elliot Gurney, RN, BSN, Kim RN, BSN Entered By: Elliot Gurney, RN, BSN, Kim on 02/08/2015 14:32:26 Lee Clark ([id number]) -------------------------------------------------------------------------------- Patient/Caregiver Education Details Patient Name: Lee Clark Date of Service: 02/08/2015 2:15 PM Medical Record Number: [id number] Patient Account Number: [id number] Date of Birth/Gender: 1956-05-08 (59 y.o. Male) Treating RN: Curtis Sites Primary Care Physician: [name] Other Clinician: Referring Physician: [name] Treating Physician/Extender: Rudene Re in Treatment: 9 Education Assessment Education Provided To: Patient Education Topics Provided Pressure: Handouts: Preventing Pressure Ulcers Methods: Explain/Verbal Responses: State content correctly Electronic Signature(s) Signed: 02/08/2015 3:26:09 PM By: Curtis Sites Entered By: Curtis Sites on 02/08/2015 14:28:09 Lee Clark ([id number]) -------------------------------------------------------------------------------- Wound Assessment Details  Patient Name: Lee Clark, Lee Clark Date of Service: 02/08/2015 2:15 PM Medical Record Number: [id number] Patient Account Number: [id number] Date of Birth/Sex: [birth year] (59 y.o. Male) Treating RN: Curtis Sites Primary Care Physician: [name] Other Clinician: Referring Physician: [name] Treating Physician/Extender: Rudene Re in Treatment: 9 Wound Status Wound Number: 1 Primary Pressure Ulcer Etiology: Wound Location: Right Gluteus Wound Status: Open Wounding Event: Pressure Injury Comorbid History of pressure wounds, Date Acquired: 11/01/2014 History: Osteoarthritis Weeks Of Treatment: 9 Clustered Wound: No Photos Photo Uploaded By: Curtis Sites on 02/08/2015 15:24:17 Wound Measurements Length: (cm) 0.1 Width: (cm) 0.1 Depth: (cm) 0.1 Area: (cm) 0.008 Volume: (cm) 0.001 % Reduction in Area: 99.6% % Reduction in Volume: 99.7% Epithelialization: Medium (34-66%) Tunneling: No Undermining: No Wound Description Classification: Category/Stage III Wound Margin: Distinct, outline attached Exudate Amount: Medium Exudate Type: Serosanguineous Exudate Color: red, brown Foul Odor After Cleansing: No Wound Bed Granulation Amount: Large (67-100%) Exposed Structure Granulation Quality: Pink Fascia Exposed: No Necrotic Amount: None Present (0%) Fat Layer Exposed: No Tendon Exposed: No Lee Clark, Lee Clark ([id number]) Muscle Exposed: No Joint Exposed: No Bone Exposed: No Limited to Skin Breakdown Periwound Skin Texture Texture Color No Abnormalities Noted: No No Abnormalities Noted: No Callus: No Atrophie Blanche: No Crepitus: No Cyanosis: No Excoriation: No Ecchymosis: No Fluctuance: No Erythema: No Friable: No Hemosiderin Staining: No Induration: No Mottled: No Localized Edema: No Pallor: No Rash: No Rubor: No Scarring: No Temperature / Pain Moisture Temperature: No Abnormality No Abnormalities Noted: No Dry / Scaly: No Maceration: No Moist: Yes Wound Preparation Ulcer Cleansing: Rinsed/Irrigated with Saline Topical Anesthetic Applied: None Treatment Notes Wound #1 (Right Gluteus) 1. Cleansed with: Clean wound with Normal Saline 3. Peri-wound Care: Skin Prep 4. Dressing Applied: Prisma Ag 5. Secondary Dressing Applied Bordered Foam Dressing Notes Scientist, water quality) Signed: 02/08/2015 3:26:09 PM By: Curtis Sites Entered By: Curtis Sites on 02/08/2015 14:27:49 Lee Clark ([id number]) -------------------------------------------------------------------------------- Vitals Details Patient Name: Lee Clark Date of Service: 02/08/2015 2:15 PM Medical Record Number: [id number] Patient Account Number: [id number] Date of Birth/Sex: Nov 01, 1955 (59 y.o. Male) Treating RN: Curtis Sites Primary Care Physician: [name] Other Clinician: Referring Physician: [name] Treating Physician/Extender: Rudene Re in Treatment: 9 Vital Signs Time Taken: 14:17 Temperature (F): 98.4 Height (in): 74 Pulse (bpm): 86 Weight (lbs): 140 Respiratory Rate (breaths/min): 18 Body Mass Index (BMI): 18 Blood Pressure (mmHg): 109/60 Reference Range: 80 - 120 mg / dl Electronic Signature(s) Signed: 02/08/2015 3:26:09 PM By: Curtis Sites Entered By: Curtis Sites on 02/08/2015 14:17:19
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
## 2015-02-22 ENCOUNTER — Encounter: Payer: Medicare Other | Attending: Surgery | Admitting: Surgery

## 2015-02-22 DIAGNOSIS — F17218 Nicotine dependence, cigarettes, with other nicotine-induced disorders: Secondary | ICD-10-CM | POA: Insufficient documentation

## 2015-02-22 DIAGNOSIS — L89313 Pressure ulcer of right buttock, stage 3: Secondary | ICD-10-CM | POA: Diagnosis not present

## 2015-02-22 DIAGNOSIS — G8222 Paraplegia, incomplete: Secondary | ICD-10-CM | POA: Insufficient documentation
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
## 2015-02-23 NOTE — Progress Notes (Signed)
Lee Clark ([id number]) Visit Report for 02/22/2015 Arrival Information Details Patient Name: Lee Clark Date of Service: 02/22/2015 2:15 PM Medical Record Number: [id number] Patient Account Number: [id number] Date of Birth/Sex: 1955-12-10 (59 y.o. Male) Treating RN: Curtis Sites Primary Care Physician: [name] Other Clinician: Referring Physician: [name] Treating Physician/Extender: Rudene Re in Treatment: 11 Visit Information History Since Last Visit Added or deleted any medications: No Patient Arrived: Wheel Chair Any new allergies or adverse reactions: No Arrival Time: 14:12 Had a fall or experienced change in No Accompanied By: spouse activities of daily living that may affect Transfer Assistance: Michiel Sites Lift risk of falls: Patient Identification Verified: Yes Signs or symptoms of abuse/neglect since last No Secondary Verification Process Yes visito Completed: Hospitalized since last visit: No Patient Requires Transmission- No Pain Present Now: No Based Precautions: Patient Has Alerts: Yes Patient Alerts: Patient on Blood Thinner Electronic Signature(s) Signed: 02/22/2015 4:58:54 PM By: Curtis Sites Entered By: Curtis Sites on 02/22/2015 14:12:46 Lee Clark ([id number]) -------------------------------------------------------------------------------- Clinic Level of Care Assessment Details Patient Name: Lee Clark Date of Service: 02/22/2015 2:15 PM Medical Record Number: [id number] Patient Account Number: [id number] Date of Birth/Sex: 1956-01-05 (59 y.o. Male) Treating RN: Curtis Sites Primary Care Physician: [name] Other Clinician: Referring Physician: [name] Treating Physician/Extender: Rudene Re in Treatment: 11 Clinic Level of Care Assessment Items TOOL 4 Quantity Score  - Use when only an EandM is performed on FOLLOW-UP visit 0 ASSESSMENTS - Nursing Assessment /  Reassessment X - Reassessment of Co-morbidities (includes updates in patient status) 1 10 X - Reassessment of Adherence to Treatment Plan 1 5 ASSESSMENTS - Wound and Skin Assessment / Reassessment  - Simple Wound Assessment / Reassessment - one wound 0  - Complex Wound Assessment / Reassessment - multiple wounds 0  - Dermatologic / Skin Assessment (not related to wound area) 0 ASSESSMENTS - Focused Assessment  - Circumferential Edema Measurements - multi extremities 0  - Nutritional Assessment / Counseling / Intervention 0  - Lower Extremity Assessment (monofilament, tuning fork, pulses) 0  - Peripheral Arterial Disease Assessment (using hand held doppler) 0 ASSESSMENTS - Ostomy and/or Continence Assessment and Care  - Incontinence Assessment and Management 0  - Ostomy Care Assessment and Management (repouching, etc.) 0 PROCESS - Coordination of Care X - Simple Patient / Family Education for ongoing care 1 15  - Complex (extensive) Patient / Family Education for ongoing care 0  - Staff obtains Chiropractor, Records, Test Results / Process Orders 0  - Staff telephones HHA, Nursing Homes / Clarify orders / etc 0  - Routine Transfer to another Facility (non-emergent condition) 0 Lee Clark ([id number])  - Routine Hospital Admission (non-emergent condition) 0  - New Admissions / Manufacturing engineer / Ordering NPWT, Apligraf, etc. 0  - Emergency Hospital Admission (emergent condition) 0 X - Simple Discharge Coordination 1 10  - Complex (extensive) Discharge Coordination 0 PROCESS - Special Needs  - Pediatric / Minor Patient Management 0  - Isolation Patient Management 0  - Hearing / Language / Visual special needs 0  - Assessment of Community assistance (transportation, D/C planning, etc.) 0  - Additional assistance / Altered mentation 0  - Support Surface(s) Assessment (bed, cushion, seat, etc.) 0 INTERVENTIONS - Wound Cleansing /  Measurement X - Simple Wound Cleansing - one wound 1 5  - Complex Wound Cleansing - multiple wounds 0 X - Wound Imaging (photographs - any number of wounds) 1 5  -  Wound Tracing (instead of photographs) 0 X - Simple Wound Measurement - one wound 1 5  - Complex Wound Measurement - multiple wounds 0 INTERVENTIONS - Wound Dressings X - Small Wound Dressing one or multiple wounds 1 10  - Medium Wound Dressing one or multiple wounds 0  - Large Wound Dressing one or multiple wounds 0  - Application of Medications - topical 0  - Application of Medications - injection 0 INTERVENTIONS - Miscellaneous  - External ear exam 0 Lee Clark ([id number])  - Specimen Collection (cultures, biopsies, blood, body fluids, etc.) 0  - Specimen(s) / Culture(s) sent or taken to Lab for analysis 0  - Patient Transfer (multiple staff / Michiel Sites Lift / Similar devices) 0  - Simple Staple / Suture removal (25 or less) 0  - Complex Staple / Suture removal (26 or more) 0  - Hypo / Hyperglycemic Management (close monitor of Blood Glucose) 0  - Ankle / Brachial Index (ABI) - do not check if billed separately 0 X - Vital Signs 1 5 Has the patient been seen at the hospital within the last three years: Yes Total Score: 70 Level Of Care: New/Established - Level 2 Electronic Signature(s) Signed: 02/22/2015 4:58:54 PM By: Curtis Sites Entered By: Curtis Sites on 02/22/2015 14:33:08 Lee Clark ([id number]) -------------------------------------------------------------------------------- Encounter Discharge Information Details Patient Name: Lee Clark Date of Service: 02/22/2015 2:15 PM Medical Record Number: [id number] Patient Account Number: [id number] Date of Birth/Sex: 31-Jan-1956 (59 y.o. Male) Treating RN: Curtis Sites Primary Care Physician: [name] Other Clinician: Referring Physician: [name] Treating Physician/Extender: Rudene Re in  Treatment: 11 Encounter Discharge Information Items Discharge Pain Level: 0 Discharge Condition: Stable Ambulatory Status: Wheelchair Discharge Destination: Home Transportation: Private Auto Accompanied By: spouse Schedule Follow-up Appointment: No Medication Reconciliation completed and provided to Patient/Care No Alison Kubicki: Provided on Clinical Summary of Care: 02/22/2015 Form Type Recipient Paper Patient TD Electronic Signature(s) Signed: 02/22/2015 2:44:14 PM By: Gwenlyn Perking Entered By: Gwenlyn Perking on 02/22/2015 14:44:14 Lee Clark ([id number]) -------------------------------------------------------------------------------- Multi-Disciplinary Care Plan Details Patient Name: Lee Clark Date of Service: 02/22/2015 2:15 PM Medical Record Number: [id number] Patient Account Number: [id number] Date of Birth/Sex: 05-Sep-1955 (59 y.o. Male) Treating RN: Curtis Sites Primary Care Physician: [name] Other Clinician: Referring Physician: [name] Treating Physician/Extender: Rudene Re in Treatment: 11 Active Inactive Electronic Signature(s) Signed: 02/22/2015 4:58:54 PM By: Curtis Sites Entered By: Curtis Sites on 02/22/2015 14:32:04 Lee Clark ([id number]) -------------------------------------------------------------------------------- Patient/Caregiver Education Details Patient Name: Lee Clark Date of Service: 02/22/2015 2:15 PM Medical Record Number: [id number] Patient Account Number: [id number] Date of Birth/Gender: July 14, 1955 (59 y.o. Male) Treating RN: Curtis Sites Primary Care Physician: [name] Other Clinician: Referring Physician: [name] Treating Physician/Extender: Rudene Re in Treatment: 11 Education Assessment Education Provided To: Patient and Caregiver Education Topics Provided Pressure: Handouts: Other: continue pressure ulcer prevention techniques Methods:  Explain/Verbal Responses: State content correctly Electronic Signature(s) Signed: 02/22/2015 4:58:54 PM By: Curtis Sites Entered By: Curtis Sites on 02/22/2015 14:23:36 Lee Clark ([id number]) -------------------------------------------------------------------------------- Wound Assessment Details Patient Name: Lee Clark Date of Service: 02/22/2015 2:15 PM Medical Record Number: [id number] Patient Account Number: [id number] Date of Birth/Sex: 04-21-1956 (59 y.o. Male) Treating RN: Curtis Sites Primary Care Physician: [name] Other Clinician: Referring Physician: [name] Treating Physician/Extender: Rudene Re in Treatment: 11 Wound Status Wound Number: 1 Primary Etiology: Pressure Ulcer Wound Location: Right Gluteus Wound Status: Healed - Epithelialized Wounding Event: Pressure Injury Date  Acquired: 11/01/2014 Weeks Of Treatment: 11 Clustered Wound: No Photos Photo Uploaded By: Curtis Sites on 02/22/2015 16:45:29 Wound Measurements Length: (cm) 0 % Reduction in Width: (cm) 0 % Reduction in Depth: (cm) 0 Area: (cm) 0 Volume: (cm) 0 Area: 100% Volume: 100% Wound Description Classification: Category/Stage III Periwound Skin Texture Texture Color No Abnormalities Noted: No No Abnormalities Noted: No Moisture No Abnormalities Noted: No Electronic Signature(s) Signed: 02/22/2015 4:58:54 PM By: Williemae Area ([id number]) Entered By: Curtis Sites on 02/22/2015 14:23:05 Lee Clark ([id number]) -------------------------------------------------------------------------------- Vitals Details Patient Name: Lee Clark Date of Service: 02/22/2015 2:15 PM Medical Record Number: [id number] Patient Account Number: [id number] Date of Birth/Sex: 11/20/1955 (59 y.o. Male) Treating RN: Curtis Sites Primary Care Physician: [name] Other Clinician: Referring Physician: [name] Treating  Physician/Extender: Rudene Re in Treatment: 11 Vital Signs Time Taken: 14:12 Temperature (F): 98.2 Height (in): 74 Pulse (bpm): 89 Weight (lbs): 140 Respiratory Rate (breaths/min): 18 Body Mass Index (BMI): 18 Blood Pressure (mmHg): 115/64 Reference Range: 80 - 120 mg / dl Electronic Signature(s) Signed: 02/22/2015 4:58:54 PM By: Curtis Sites Entered By: Curtis Sites on 02/22/2015 14:13:21

## 2015-02-26 ENCOUNTER — Telehealth: Payer: Self-pay | Admitting: Family Medicine

## 2015-02-26 DIAGNOSIS — R531 Weakness: Secondary | ICD-10-CM

## 2015-02-26 NOTE — Telephone Encounter (Signed)
Pt was getting wound care and PT in home from [facility]. The wound care center ended the wound care but pt would like to get a referral from Dr. Sherrie Mustache to continue in home PT with [facility]. Thanks TNP

## 2015-02-26 NOTE — Telephone Encounter (Signed)
Please advise 

## 2015-02-27 NOTE — Telephone Encounter (Signed)
OK to continue home physical therapy

## 2015-03-01 NOTE — Telephone Encounter (Signed)
LMOVM for pt to return call 

## 2015-03-02 NOTE — Telephone Encounter (Signed)
Patient advised. Patient states since the wound care has ended with [facility], they need a new referral from our office. [facility].

## 2015-03-03 ENCOUNTER — Telehealth: Payer: Self-pay

## 2015-03-03 NOTE — Telephone Encounter (Signed)
FYIWest Clark: [name] from [facility] called stating she spoke with patient and told him that she would come out to see him for Physical therapy this Friday (03/05/2015). Patient wanted to wait until Monday (03/07/2016) to start Physical therapy because he wont be available on this Friday. Questions call Lee CarboLashonda back at [phone]. rlc

## 2015-03-08 DIAGNOSIS — G8222 Paraplegia, incomplete: Secondary | ICD-10-CM | POA: Diagnosis not present

## 2015-03-08 DIAGNOSIS — F17210 Nicotine dependence, cigarettes, uncomplicated: Secondary | ICD-10-CM | POA: Diagnosis not present

## 2015-03-08 DIAGNOSIS — M6281 Muscle weakness (generalized): Secondary | ICD-10-CM | POA: Diagnosis not present

## 2015-03-08 DIAGNOSIS — Z993 Dependence on wheelchair: Secondary | ICD-10-CM | POA: Diagnosis not present

## 2015-03-08 DIAGNOSIS — L89313 Pressure ulcer of right buttock, stage 3: Secondary | ICD-10-CM | POA: Diagnosis not present

## 2015-03-08 DIAGNOSIS — S82001D Unspecified fracture of right patella, subsequent encounter for closed fracture with routine healing: Secondary | ICD-10-CM | POA: Diagnosis not present

## 2015-03-08 DIAGNOSIS — F329 Major depressive disorder, single episode, unspecified: Secondary | ICD-10-CM | POA: Diagnosis not present

## 2015-03-08 DIAGNOSIS — I252 Old myocardial infarction: Secondary | ICD-10-CM | POA: Diagnosis not present

## 2015-03-08 DIAGNOSIS — Z8673 Personal history of transient ischemic attack (TIA), and cerebral infarction without residual deficits: Secondary | ICD-10-CM | POA: Diagnosis not present

## 2015-03-08 DIAGNOSIS — Z9181 History of falling: Secondary | ICD-10-CM | POA: Diagnosis not present

## 2015-03-10 ENCOUNTER — Telehealth: Payer: Self-pay | Admitting: Family Medicine

## 2015-03-10 NOTE — Telephone Encounter (Signed)
Needs pt and ot order verbal.  Please call back [phone]  [facility]

## 2015-03-10 NOTE — Telephone Encounter (Signed)
OK for PT & OT

## 2015-03-10 NOTE — Telephone Encounter (Signed)
Per patient order needs to be faxed to [phone]. Phone #[phone].

## 2015-03-11 DIAGNOSIS — G8222 Paraplegia, incomplete: Secondary | ICD-10-CM | POA: Diagnosis not present

## 2015-03-11 DIAGNOSIS — M6281 Muscle weakness (generalized): Secondary | ICD-10-CM | POA: Diagnosis not present

## 2015-03-11 DIAGNOSIS — F329 Major depressive disorder, single episode, unspecified: Secondary | ICD-10-CM | POA: Diagnosis not present

## 2015-03-11 DIAGNOSIS — I252 Old myocardial infarction: Secondary | ICD-10-CM | POA: Diagnosis not present

## 2015-03-11 DIAGNOSIS — Z9181 History of falling: Secondary | ICD-10-CM | POA: Diagnosis not present

## 2015-03-11 DIAGNOSIS — S82001D Unspecified fracture of right patella, subsequent encounter for closed fracture with routine healing: Secondary | ICD-10-CM | POA: Diagnosis not present

## 2015-03-11 NOTE — Telephone Encounter (Signed)
LMOVM for patient to retrun call. The fax # he gave would not go through.

## 2015-03-11 NOTE — Telephone Encounter (Signed)
Lee NoaWilliam wanted to let the nurses know that they got the OT Eval and they are going to be faxing the order for treatment to be signed and faxed back. Thanks TNP

## 2015-03-12 DIAGNOSIS — G8222 Paraplegia, incomplete: Secondary | ICD-10-CM | POA: Diagnosis not present

## 2015-03-12 DIAGNOSIS — M6281 Muscle weakness (generalized): Secondary | ICD-10-CM | POA: Diagnosis not present

## 2015-03-12 DIAGNOSIS — I252 Old myocardial infarction: Secondary | ICD-10-CM | POA: Diagnosis not present

## 2015-03-12 DIAGNOSIS — F329 Major depressive disorder, single episode, unspecified: Secondary | ICD-10-CM | POA: Diagnosis not present

## 2015-03-12 DIAGNOSIS — S82001D Unspecified fracture of right patella, subsequent encounter for closed fracture with routine healing: Secondary | ICD-10-CM | POA: Diagnosis not present

## 2015-03-12 DIAGNOSIS — Z9181 History of falling: Secondary | ICD-10-CM | POA: Diagnosis not present

## 2015-03-15 DIAGNOSIS — G5603 Carpal tunnel syndrome, bilateral upper limbs: Secondary | ICD-10-CM | POA: Diagnosis not present

## 2015-03-16 DIAGNOSIS — F329 Major depressive disorder, single episode, unspecified: Secondary | ICD-10-CM | POA: Diagnosis not present

## 2015-03-16 DIAGNOSIS — S82001D Unspecified fracture of right patella, subsequent encounter for closed fracture with routine healing: Secondary | ICD-10-CM | POA: Diagnosis not present

## 2015-03-16 DIAGNOSIS — G8222 Paraplegia, incomplete: Secondary | ICD-10-CM | POA: Diagnosis not present

## 2015-03-16 DIAGNOSIS — Z9181 History of falling: Secondary | ICD-10-CM | POA: Diagnosis not present

## 2015-03-16 DIAGNOSIS — M6281 Muscle weakness (generalized): Secondary | ICD-10-CM | POA: Diagnosis not present

## 2015-03-16 DIAGNOSIS — I252 Old myocardial infarction: Secondary | ICD-10-CM | POA: Diagnosis not present

## 2015-03-17 ENCOUNTER — Telehealth: Payer: Self-pay

## 2015-03-17 NOTE — Telephone Encounter (Signed)
Fabio AsaWilliam Schalla with [facility] called to report patient stated he no longer wanted to receive OT and they will be discharging him. Chrissie NoaWilliam will send a discharge letter to Dr. Sherrie MustacheFisher.

## 2015-03-18 ENCOUNTER — Encounter (INDEPENDENT_AMBULATORY_CARE_PROVIDER_SITE_OTHER): Payer: Medicare Other | Admitting: Family Medicine

## 2015-03-18 DIAGNOSIS — I252 Old myocardial infarction: Secondary | ICD-10-CM | POA: Diagnosis not present

## 2015-03-18 DIAGNOSIS — F329 Major depressive disorder, single episode, unspecified: Secondary | ICD-10-CM | POA: Diagnosis not present

## 2015-03-18 DIAGNOSIS — Z9181 History of falling: Secondary | ICD-10-CM | POA: Diagnosis not present

## 2015-03-18 DIAGNOSIS — S82001D Unspecified fracture of right patella, subsequent encounter for closed fracture with routine healing: Secondary | ICD-10-CM

## 2015-03-18 DIAGNOSIS — G8222 Paraplegia, incomplete: Secondary | ICD-10-CM

## 2015-03-18 DIAGNOSIS — M6281 Muscle weakness (generalized): Secondary | ICD-10-CM | POA: Diagnosis not present

## 2015-03-22 DIAGNOSIS — G5603 Carpal tunnel syndrome, bilateral upper limbs: Secondary | ICD-10-CM | POA: Diagnosis not present

## 2015-03-23 DIAGNOSIS — M6281 Muscle weakness (generalized): Secondary | ICD-10-CM | POA: Diagnosis not present

## 2015-03-23 DIAGNOSIS — I252 Old myocardial infarction: Secondary | ICD-10-CM | POA: Diagnosis not present

## 2015-03-23 DIAGNOSIS — F329 Major depressive disorder, single episode, unspecified: Secondary | ICD-10-CM | POA: Diagnosis not present

## 2015-03-23 DIAGNOSIS — S82001D Unspecified fracture of right patella, subsequent encounter for closed fracture with routine healing: Secondary | ICD-10-CM | POA: Diagnosis not present

## 2015-03-23 DIAGNOSIS — G8222 Paraplegia, incomplete: Secondary | ICD-10-CM | POA: Diagnosis not present

## 2015-03-23 DIAGNOSIS — Z9181 History of falling: Secondary | ICD-10-CM | POA: Diagnosis not present

## 2015-03-26 ENCOUNTER — Encounter: Payer: Self-pay | Admitting: Family Medicine

## 2015-03-29 DIAGNOSIS — G5603 Carpal tunnel syndrome, bilateral upper limbs: Secondary | ICD-10-CM | POA: Diagnosis not present

## 2015-03-30 DIAGNOSIS — I252 Old myocardial infarction: Secondary | ICD-10-CM | POA: Diagnosis not present

## 2015-03-30 DIAGNOSIS — F329 Major depressive disorder, single episode, unspecified: Secondary | ICD-10-CM | POA: Diagnosis not present

## 2015-03-30 DIAGNOSIS — M6281 Muscle weakness (generalized): Secondary | ICD-10-CM | POA: Diagnosis not present

## 2015-03-30 DIAGNOSIS — G8222 Paraplegia, incomplete: Secondary | ICD-10-CM | POA: Diagnosis not present

## 2015-03-30 DIAGNOSIS — Z9181 History of falling: Secondary | ICD-10-CM | POA: Diagnosis not present

## 2015-03-30 DIAGNOSIS — S82001D Unspecified fracture of right patella, subsequent encounter for closed fracture with routine healing: Secondary | ICD-10-CM | POA: Diagnosis not present

## 2015-04-01 ENCOUNTER — Telehealth: Payer: Self-pay | Admitting: Family Medicine

## 2015-04-01 DIAGNOSIS — I252 Old myocardial infarction: Secondary | ICD-10-CM | POA: Diagnosis not present

## 2015-04-01 DIAGNOSIS — M6281 Muscle weakness (generalized): Secondary | ICD-10-CM | POA: Diagnosis not present

## 2015-04-01 DIAGNOSIS — Z9181 History of falling: Secondary | ICD-10-CM | POA: Diagnosis not present

## 2015-04-01 DIAGNOSIS — G8222 Paraplegia, incomplete: Secondary | ICD-10-CM | POA: Diagnosis not present

## 2015-04-01 DIAGNOSIS — F329 Major depressive disorder, single episode, unspecified: Secondary | ICD-10-CM | POA: Diagnosis not present

## 2015-04-01 DIAGNOSIS — S82001D Unspecified fracture of right patella, subsequent encounter for closed fracture with routine healing: Secondary | ICD-10-CM | POA: Diagnosis not present

## 2015-04-01 NOTE — Telephone Encounter (Signed)
Ok to order? Please advise. Thanks!  

## 2015-04-01 NOTE — Telephone Encounter (Signed)
Ben with [facility] is requesting verbal orders for a home health nurse to evaluate pt's wound on his bottom. Romeo AppleBen stated the wound isn't open yet just raw but would like a nurse to evaluate. Please advise. Thanks TNP

## 2015-04-01 NOTE — Telephone Encounter (Signed)
Verbal order given to nurse at [facility].

## 2015-04-01 NOTE — Telephone Encounter (Signed)
OK for home health order

## 2015-04-04 DIAGNOSIS — G8222 Paraplegia, incomplete: Secondary | ICD-10-CM | POA: Diagnosis not present

## 2015-04-04 DIAGNOSIS — F329 Major depressive disorder, single episode, unspecified: Secondary | ICD-10-CM | POA: Diagnosis not present

## 2015-04-04 DIAGNOSIS — I252 Old myocardial infarction: Secondary | ICD-10-CM | POA: Diagnosis not present

## 2015-04-04 DIAGNOSIS — M6281 Muscle weakness (generalized): Secondary | ICD-10-CM | POA: Diagnosis not present

## 2015-04-04 DIAGNOSIS — S82001D Unspecified fracture of right patella, subsequent encounter for closed fracture with routine healing: Secondary | ICD-10-CM | POA: Diagnosis not present

## 2015-04-04 DIAGNOSIS — Z9181 History of falling: Secondary | ICD-10-CM | POA: Diagnosis not present

## 2015-04-07 DIAGNOSIS — S82001D Unspecified fracture of right patella, subsequent encounter for closed fracture with routine healing: Secondary | ICD-10-CM | POA: Diagnosis not present

## 2015-04-07 DIAGNOSIS — Z9181 History of falling: Secondary | ICD-10-CM | POA: Diagnosis not present

## 2015-04-07 DIAGNOSIS — I252 Old myocardial infarction: Secondary | ICD-10-CM | POA: Diagnosis not present

## 2015-04-07 DIAGNOSIS — F329 Major depressive disorder, single episode, unspecified: Secondary | ICD-10-CM | POA: Diagnosis not present

## 2015-04-07 DIAGNOSIS — M6281 Muscle weakness (generalized): Secondary | ICD-10-CM | POA: Diagnosis not present

## 2015-04-07 DIAGNOSIS — G8222 Paraplegia, incomplete: Secondary | ICD-10-CM | POA: Diagnosis not present

## 2015-04-08 DIAGNOSIS — S82001D Unspecified fracture of right patella, subsequent encounter for closed fracture with routine healing: Secondary | ICD-10-CM | POA: Diagnosis not present

## 2015-04-08 DIAGNOSIS — I252 Old myocardial infarction: Secondary | ICD-10-CM | POA: Diagnosis not present

## 2015-04-08 DIAGNOSIS — F329 Major depressive disorder, single episode, unspecified: Secondary | ICD-10-CM | POA: Diagnosis not present

## 2015-04-08 DIAGNOSIS — G8222 Paraplegia, incomplete: Secondary | ICD-10-CM | POA: Diagnosis not present

## 2015-04-08 DIAGNOSIS — M6281 Muscle weakness (generalized): Secondary | ICD-10-CM | POA: Diagnosis not present

## 2015-04-08 DIAGNOSIS — Z9181 History of falling: Secondary | ICD-10-CM | POA: Diagnosis not present

## 2015-04-09 DIAGNOSIS — M6281 Muscle weakness (generalized): Secondary | ICD-10-CM | POA: Diagnosis not present

## 2015-04-09 DIAGNOSIS — I252 Old myocardial infarction: Secondary | ICD-10-CM | POA: Diagnosis not present

## 2015-04-09 DIAGNOSIS — S82001D Unspecified fracture of right patella, subsequent encounter for closed fracture with routine healing: Secondary | ICD-10-CM | POA: Diagnosis not present

## 2015-04-09 DIAGNOSIS — G8222 Paraplegia, incomplete: Secondary | ICD-10-CM | POA: Diagnosis not present

## 2015-04-09 DIAGNOSIS — F329 Major depressive disorder, single episode, unspecified: Secondary | ICD-10-CM | POA: Diagnosis not present

## 2015-04-09 DIAGNOSIS — Z9181 History of falling: Secondary | ICD-10-CM | POA: Diagnosis not present

## 2015-04-13 DIAGNOSIS — S82001D Unspecified fracture of right patella, subsequent encounter for closed fracture with routine healing: Secondary | ICD-10-CM | POA: Diagnosis not present

## 2015-04-13 DIAGNOSIS — I252 Old myocardial infarction: Secondary | ICD-10-CM | POA: Diagnosis not present

## 2015-04-13 DIAGNOSIS — M6281 Muscle weakness (generalized): Secondary | ICD-10-CM | POA: Diagnosis not present

## 2015-04-13 DIAGNOSIS — F329 Major depressive disorder, single episode, unspecified: Secondary | ICD-10-CM | POA: Diagnosis not present

## 2015-04-13 DIAGNOSIS — Z9181 History of falling: Secondary | ICD-10-CM | POA: Diagnosis not present

## 2015-04-13 DIAGNOSIS — G8222 Paraplegia, incomplete: Secondary | ICD-10-CM | POA: Diagnosis not present

## 2015-04-14 DIAGNOSIS — F329 Major depressive disorder, single episode, unspecified: Secondary | ICD-10-CM | POA: Diagnosis not present

## 2015-04-14 DIAGNOSIS — G8222 Paraplegia, incomplete: Secondary | ICD-10-CM | POA: Diagnosis not present

## 2015-04-14 DIAGNOSIS — I252 Old myocardial infarction: Secondary | ICD-10-CM | POA: Diagnosis not present

## 2015-04-14 DIAGNOSIS — S82001D Unspecified fracture of right patella, subsequent encounter for closed fracture with routine healing: Secondary | ICD-10-CM | POA: Diagnosis not present

## 2015-04-14 DIAGNOSIS — Z9181 History of falling: Secondary | ICD-10-CM | POA: Diagnosis not present

## 2015-04-14 DIAGNOSIS — M6281 Muscle weakness (generalized): Secondary | ICD-10-CM | POA: Diagnosis not present

## 2015-04-17 DIAGNOSIS — M6281 Muscle weakness (generalized): Secondary | ICD-10-CM | POA: Diagnosis not present

## 2015-04-17 DIAGNOSIS — G8222 Paraplegia, incomplete: Secondary | ICD-10-CM | POA: Diagnosis not present

## 2015-04-17 DIAGNOSIS — S82001D Unspecified fracture of right patella, subsequent encounter for closed fracture with routine healing: Secondary | ICD-10-CM | POA: Diagnosis not present

## 2015-04-17 DIAGNOSIS — I252 Old myocardial infarction: Secondary | ICD-10-CM | POA: Diagnosis not present

## 2015-04-17 DIAGNOSIS — F329 Major depressive disorder, single episode, unspecified: Secondary | ICD-10-CM | POA: Diagnosis not present

## 2015-04-17 DIAGNOSIS — Z9181 History of falling: Secondary | ICD-10-CM | POA: Diagnosis not present

## 2015-04-20 DIAGNOSIS — Z9181 History of falling: Secondary | ICD-10-CM | POA: Diagnosis not present

## 2015-04-20 DIAGNOSIS — M6281 Muscle weakness (generalized): Secondary | ICD-10-CM | POA: Diagnosis not present

## 2015-04-20 DIAGNOSIS — I252 Old myocardial infarction: Secondary | ICD-10-CM | POA: Diagnosis not present

## 2015-04-20 DIAGNOSIS — G8222 Paraplegia, incomplete: Secondary | ICD-10-CM | POA: Diagnosis not present

## 2015-04-20 DIAGNOSIS — S82001D Unspecified fracture of right patella, subsequent encounter for closed fracture with routine healing: Secondary | ICD-10-CM | POA: Diagnosis not present

## 2015-04-20 DIAGNOSIS — F329 Major depressive disorder, single episode, unspecified: Secondary | ICD-10-CM | POA: Diagnosis not present

## 2015-04-21 DIAGNOSIS — F329 Major depressive disorder, single episode, unspecified: Secondary | ICD-10-CM | POA: Diagnosis not present

## 2015-04-21 DIAGNOSIS — Z9181 History of falling: Secondary | ICD-10-CM | POA: Diagnosis not present

## 2015-04-21 DIAGNOSIS — S82001D Unspecified fracture of right patella, subsequent encounter for closed fracture with routine healing: Secondary | ICD-10-CM | POA: Diagnosis not present

## 2015-04-21 DIAGNOSIS — M6281 Muscle weakness (generalized): Secondary | ICD-10-CM | POA: Diagnosis not present

## 2015-04-21 DIAGNOSIS — I252 Old myocardial infarction: Secondary | ICD-10-CM | POA: Diagnosis not present

## 2015-04-21 DIAGNOSIS — G8222 Paraplegia, incomplete: Secondary | ICD-10-CM | POA: Diagnosis not present

## 2015-04-22 DIAGNOSIS — G8222 Paraplegia, incomplete: Secondary | ICD-10-CM | POA: Diagnosis not present

## 2015-04-22 DIAGNOSIS — M6281 Muscle weakness (generalized): Secondary | ICD-10-CM | POA: Diagnosis not present

## 2015-04-22 DIAGNOSIS — Z9181 History of falling: Secondary | ICD-10-CM | POA: Diagnosis not present

## 2015-04-22 DIAGNOSIS — S82001D Unspecified fracture of right patella, subsequent encounter for closed fracture with routine healing: Secondary | ICD-10-CM | POA: Diagnosis not present

## 2015-04-22 DIAGNOSIS — I252 Old myocardial infarction: Secondary | ICD-10-CM | POA: Diagnosis not present

## 2015-04-22 DIAGNOSIS — F329 Major depressive disorder, single episode, unspecified: Secondary | ICD-10-CM | POA: Diagnosis not present

## 2015-04-23 ENCOUNTER — Telehealth: Payer: Self-pay | Admitting: *Deleted

## 2015-04-23 DIAGNOSIS — G8222 Paraplegia, incomplete: Secondary | ICD-10-CM | POA: Diagnosis not present

## 2015-04-23 DIAGNOSIS — F329 Major depressive disorder, single episode, unspecified: Secondary | ICD-10-CM | POA: Diagnosis not present

## 2015-04-23 DIAGNOSIS — Z9181 History of falling: Secondary | ICD-10-CM | POA: Diagnosis not present

## 2015-04-23 DIAGNOSIS — M6281 Muscle weakness (generalized): Secondary | ICD-10-CM | POA: Diagnosis not present

## 2015-04-23 DIAGNOSIS — S82001D Unspecified fracture of right patella, subsequent encounter for closed fracture with routine healing: Secondary | ICD-10-CM | POA: Diagnosis not present

## 2015-04-23 DIAGNOSIS — I252 Old myocardial infarction: Secondary | ICD-10-CM | POA: Diagnosis not present

## 2015-04-23 MED ORDER — SULFAMETHOXAZOLE-TRIMETHOPRIM 800-160 MG PO TABS: 1.0000 | ORAL_TABLET | Freq: Two times a day (BID) | ORAL | Status: AC

## 2015-04-23 NOTE — Telephone Encounter (Signed)
Please send Bactrim DS to pharmacy of his choice.

## 2015-04-23 NOTE — Telephone Encounter (Signed)
Rx sent to pharmacy. Michelle notified.

## 2015-04-23 NOTE — Telephone Encounter (Signed)
Marcelino DusterMichelle from home health called office to request antibiotic for pin size sore on patient's right lower buttock that is infected. Call back#[phone]

## 2015-04-26 ENCOUNTER — Telehealth: Payer: Self-pay

## 2015-04-26 MED ORDER — DOXYCYCLINE HYCLATE 100 MG PO TABS: 100.0000 mg | ORAL_TABLET | Freq: Two times a day (BID) | ORAL | Status: DC

## 2015-04-26 NOTE — Telephone Encounter (Signed)
Sent in medication into patient's pharmacy. Advised the patient and Marcelino DusterMichelle from [facility].

## 2015-04-26 NOTE — Telephone Encounter (Signed)
Lee DusterMichelle with [facility] called and states that Friday patient was prescribed bactrim DS but patient can not take it due to he gets bad diarrhea and stomach issues when he is on this. Can something else be called in? Please review. CB: [phone]

## 2015-04-26 NOTE — Telephone Encounter (Signed)
Can change to doxycycline 100mg  twice a day for 14 days.

## 2015-04-28 DIAGNOSIS — G8222 Paraplegia, incomplete: Secondary | ICD-10-CM | POA: Diagnosis not present

## 2015-04-28 DIAGNOSIS — M6281 Muscle weakness (generalized): Secondary | ICD-10-CM | POA: Diagnosis not present

## 2015-04-28 DIAGNOSIS — S82001D Unspecified fracture of right patella, subsequent encounter for closed fracture with routine healing: Secondary | ICD-10-CM | POA: Diagnosis not present

## 2015-04-28 DIAGNOSIS — I252 Old myocardial infarction: Secondary | ICD-10-CM | POA: Diagnosis not present

## 2015-04-28 DIAGNOSIS — Z9181 History of falling: Secondary | ICD-10-CM | POA: Diagnosis not present

## 2015-04-28 DIAGNOSIS — F329 Major depressive disorder, single episode, unspecified: Secondary | ICD-10-CM | POA: Diagnosis not present

## 2015-04-30 DIAGNOSIS — S82001D Unspecified fracture of right patella, subsequent encounter for closed fracture with routine healing: Secondary | ICD-10-CM | POA: Diagnosis not present

## 2015-04-30 DIAGNOSIS — I252 Old myocardial infarction: Secondary | ICD-10-CM | POA: Diagnosis not present

## 2015-04-30 DIAGNOSIS — Z9181 History of falling: Secondary | ICD-10-CM | POA: Diagnosis not present

## 2015-04-30 DIAGNOSIS — F329 Major depressive disorder, single episode, unspecified: Secondary | ICD-10-CM | POA: Diagnosis not present

## 2015-04-30 DIAGNOSIS — G8222 Paraplegia, incomplete: Secondary | ICD-10-CM | POA: Diagnosis not present

## 2015-04-30 DIAGNOSIS — M6281 Muscle weakness (generalized): Secondary | ICD-10-CM | POA: Diagnosis not present

## 2015-05-03 DIAGNOSIS — I252 Old myocardial infarction: Secondary | ICD-10-CM | POA: Diagnosis not present

## 2015-05-03 DIAGNOSIS — S82001D Unspecified fracture of right patella, subsequent encounter for closed fracture with routine healing: Secondary | ICD-10-CM | POA: Diagnosis not present

## 2015-05-03 DIAGNOSIS — F329 Major depressive disorder, single episode, unspecified: Secondary | ICD-10-CM | POA: Diagnosis not present

## 2015-05-03 DIAGNOSIS — M6281 Muscle weakness (generalized): Secondary | ICD-10-CM | POA: Diagnosis not present

## 2015-05-03 DIAGNOSIS — Z9181 History of falling: Secondary | ICD-10-CM | POA: Diagnosis not present

## 2015-05-03 DIAGNOSIS — G8222 Paraplegia, incomplete: Secondary | ICD-10-CM | POA: Diagnosis not present

## 2015-05-07 DIAGNOSIS — S82001D Unspecified fracture of right patella, subsequent encounter for closed fracture with routine healing: Secondary | ICD-10-CM | POA: Diagnosis not present

## 2015-05-07 DIAGNOSIS — G8222 Paraplegia, incomplete: Secondary | ICD-10-CM | POA: Diagnosis not present

## 2015-05-07 DIAGNOSIS — Z9181 History of falling: Secondary | ICD-10-CM | POA: Diagnosis not present

## 2015-05-07 DIAGNOSIS — Z4800 Encounter for change or removal of nonsurgical wound dressing: Secondary | ICD-10-CM | POA: Diagnosis not present

## 2015-05-07 DIAGNOSIS — F329 Major depressive disorder, single episode, unspecified: Secondary | ICD-10-CM | POA: Diagnosis not present

## 2015-05-07 DIAGNOSIS — F17210 Nicotine dependence, cigarettes, uncomplicated: Secondary | ICD-10-CM | POA: Diagnosis not present

## 2015-05-07 DIAGNOSIS — L89313 Pressure ulcer of right buttock, stage 3: Secondary | ICD-10-CM | POA: Diagnosis not present

## 2015-05-07 DIAGNOSIS — I252 Old myocardial infarction: Secondary | ICD-10-CM | POA: Diagnosis not present

## 2015-05-07 DIAGNOSIS — Z993 Dependence on wheelchair: Secondary | ICD-10-CM | POA: Diagnosis not present

## 2015-05-07 DIAGNOSIS — Z8673 Personal history of transient ischemic attack (TIA), and cerebral infarction without residual deficits: Secondary | ICD-10-CM | POA: Diagnosis not present

## 2015-05-12 DIAGNOSIS — F329 Major depressive disorder, single episode, unspecified: Secondary | ICD-10-CM | POA: Diagnosis not present

## 2015-05-12 DIAGNOSIS — G8222 Paraplegia, incomplete: Secondary | ICD-10-CM | POA: Diagnosis not present

## 2015-05-12 DIAGNOSIS — I252 Old myocardial infarction: Secondary | ICD-10-CM | POA: Diagnosis not present

## 2015-05-12 DIAGNOSIS — L89313 Pressure ulcer of right buttock, stage 3: Secondary | ICD-10-CM | POA: Diagnosis not present

## 2015-05-12 DIAGNOSIS — Z9181 History of falling: Secondary | ICD-10-CM | POA: Diagnosis not present

## 2015-05-12 DIAGNOSIS — S82001D Unspecified fracture of right patella, subsequent encounter for closed fracture with routine healing: Secondary | ICD-10-CM | POA: Diagnosis not present

## 2015-05-14 DIAGNOSIS — G8222 Paraplegia, incomplete: Secondary | ICD-10-CM | POA: Diagnosis not present

## 2015-05-14 DIAGNOSIS — S82001D Unspecified fracture of right patella, subsequent encounter for closed fracture with routine healing: Secondary | ICD-10-CM | POA: Diagnosis not present

## 2015-05-14 DIAGNOSIS — F329 Major depressive disorder, single episode, unspecified: Secondary | ICD-10-CM | POA: Diagnosis not present

## 2015-05-14 DIAGNOSIS — L89313 Pressure ulcer of right buttock, stage 3: Secondary | ICD-10-CM | POA: Diagnosis not present

## 2015-05-14 DIAGNOSIS — I252 Old myocardial infarction: Secondary | ICD-10-CM | POA: Diagnosis not present

## 2015-05-14 DIAGNOSIS — Z9181 History of falling: Secondary | ICD-10-CM | POA: Diagnosis not present

## 2015-05-17 ENCOUNTER — Other Ambulatory Visit: Payer: Self-pay | Admitting: Family Medicine

## 2015-05-19 ENCOUNTER — Telehealth: Payer: Self-pay | Admitting: Family Medicine

## 2015-05-19 ENCOUNTER — Encounter (INDEPENDENT_AMBULATORY_CARE_PROVIDER_SITE_OTHER): Payer: Medicare Other | Admitting: Family Medicine

## 2015-05-19 DIAGNOSIS — F329 Major depressive disorder, single episode, unspecified: Secondary | ICD-10-CM | POA: Diagnosis not present

## 2015-05-19 DIAGNOSIS — I252 Old myocardial infarction: Secondary | ICD-10-CM | POA: Diagnosis not present

## 2015-05-19 DIAGNOSIS — Z9181 History of falling: Secondary | ICD-10-CM | POA: Diagnosis not present

## 2015-05-19 DIAGNOSIS — G8222 Paraplegia, incomplete: Secondary | ICD-10-CM | POA: Diagnosis not present

## 2015-05-19 DIAGNOSIS — S82001D Unspecified fracture of right patella, subsequent encounter for closed fracture with routine healing: Secondary | ICD-10-CM | POA: Diagnosis not present

## 2015-05-19 DIAGNOSIS — L89313 Pressure ulcer of right buttock, stage 3: Secondary | ICD-10-CM

## 2015-05-19 NOTE — Telephone Encounter (Signed)
Lee Clark with [facility] called stating pt leg still has a pussy drainage.  Lee Clark is requesting a call back for advise.  WU#[phone]/NFCB#[phone]/MW

## 2015-05-20 MED ORDER — DOXYCYCLINE HYCLATE 100 MG PO TABS: 100.0000 mg | ORAL_TABLET | Freq: Two times a day (BID) | ORAL | Status: DC

## 2015-05-20 NOTE — Telephone Encounter (Signed)
Rx sent to pharmacy. [facility] Also notified patient.

## 2015-05-20 NOTE — Telephone Encounter (Signed)
Can send in rx for doxycycline 100mg  twice a day for 14 days.

## 2015-05-21 DIAGNOSIS — Z9181 History of falling: Secondary | ICD-10-CM | POA: Diagnosis not present

## 2015-05-21 DIAGNOSIS — F329 Major depressive disorder, single episode, unspecified: Secondary | ICD-10-CM | POA: Diagnosis not present

## 2015-05-21 DIAGNOSIS — G8222 Paraplegia, incomplete: Secondary | ICD-10-CM | POA: Diagnosis not present

## 2015-05-21 DIAGNOSIS — L89313 Pressure ulcer of right buttock, stage 3: Secondary | ICD-10-CM | POA: Diagnosis not present

## 2015-05-21 DIAGNOSIS — I252 Old myocardial infarction: Secondary | ICD-10-CM | POA: Diagnosis not present

## 2015-05-21 DIAGNOSIS — S82001D Unspecified fracture of right patella, subsequent encounter for closed fracture with routine healing: Secondary | ICD-10-CM | POA: Diagnosis not present

## 2015-05-28 DIAGNOSIS — G8222 Paraplegia, incomplete: Secondary | ICD-10-CM | POA: Diagnosis not present

## 2015-05-28 DIAGNOSIS — L89313 Pressure ulcer of right buttock, stage 3: Secondary | ICD-10-CM | POA: Diagnosis not present

## 2015-05-28 DIAGNOSIS — Z9181 History of falling: Secondary | ICD-10-CM | POA: Diagnosis not present

## 2015-05-28 DIAGNOSIS — S82001D Unspecified fracture of right patella, subsequent encounter for closed fracture with routine healing: Secondary | ICD-10-CM | POA: Diagnosis not present

## 2015-05-28 DIAGNOSIS — I252 Old myocardial infarction: Secondary | ICD-10-CM | POA: Diagnosis not present

## 2015-05-28 DIAGNOSIS — F329 Major depressive disorder, single episode, unspecified: Secondary | ICD-10-CM | POA: Diagnosis not present

## 2015-06-02 DIAGNOSIS — L89313 Pressure ulcer of right buttock, stage 3: Secondary | ICD-10-CM | POA: Diagnosis not present

## 2015-06-02 DIAGNOSIS — G8222 Paraplegia, incomplete: Secondary | ICD-10-CM | POA: Diagnosis not present

## 2015-06-02 DIAGNOSIS — I252 Old myocardial infarction: Secondary | ICD-10-CM | POA: Diagnosis not present

## 2015-06-02 DIAGNOSIS — Z9181 History of falling: Secondary | ICD-10-CM | POA: Diagnosis not present

## 2015-06-02 DIAGNOSIS — F329 Major depressive disorder, single episode, unspecified: Secondary | ICD-10-CM | POA: Diagnosis not present

## 2015-06-02 DIAGNOSIS — S82001D Unspecified fracture of right patella, subsequent encounter for closed fracture with routine healing: Secondary | ICD-10-CM | POA: Diagnosis not present

## 2015-06-07 ENCOUNTER — Encounter (INDEPENDENT_AMBULATORY_CARE_PROVIDER_SITE_OTHER): Payer: Medicare Other | Admitting: Family Medicine

## 2015-06-07 DIAGNOSIS — S82001D Unspecified fracture of right patella, subsequent encounter for closed fracture with routine healing: Secondary | ICD-10-CM

## 2015-06-07 DIAGNOSIS — F329 Major depressive disorder, single episode, unspecified: Secondary | ICD-10-CM | POA: Diagnosis not present

## 2015-06-07 DIAGNOSIS — G8222 Paraplegia, incomplete: Secondary | ICD-10-CM

## 2015-06-07 DIAGNOSIS — L89313 Pressure ulcer of right buttock, stage 3: Secondary | ICD-10-CM

## 2015-06-09 ENCOUNTER — Encounter: Payer: Self-pay | Admitting: Family Medicine

## 2015-06-09 DIAGNOSIS — I252 Old myocardial infarction: Secondary | ICD-10-CM | POA: Diagnosis not present

## 2015-06-09 DIAGNOSIS — Z9181 History of falling: Secondary | ICD-10-CM | POA: Diagnosis not present

## 2015-06-09 DIAGNOSIS — S82001D Unspecified fracture of right patella, subsequent encounter for closed fracture with routine healing: Secondary | ICD-10-CM | POA: Diagnosis not present

## 2015-06-09 DIAGNOSIS — G8222 Paraplegia, incomplete: Secondary | ICD-10-CM | POA: Diagnosis not present

## 2015-06-09 DIAGNOSIS — L89313 Pressure ulcer of right buttock, stage 3: Secondary | ICD-10-CM | POA: Diagnosis not present

## 2015-06-09 DIAGNOSIS — F329 Major depressive disorder, single episode, unspecified: Secondary | ICD-10-CM | POA: Diagnosis not present

## 2015-06-11 DIAGNOSIS — G8222 Paraplegia, incomplete: Secondary | ICD-10-CM | POA: Diagnosis not present

## 2015-06-11 DIAGNOSIS — L89313 Pressure ulcer of right buttock, stage 3: Secondary | ICD-10-CM | POA: Diagnosis not present

## 2015-06-11 DIAGNOSIS — I252 Old myocardial infarction: Secondary | ICD-10-CM | POA: Diagnosis not present

## 2015-06-11 DIAGNOSIS — S82001D Unspecified fracture of right patella, subsequent encounter for closed fracture with routine healing: Secondary | ICD-10-CM | POA: Diagnosis not present

## 2015-06-11 DIAGNOSIS — Z9181 History of falling: Secondary | ICD-10-CM | POA: Diagnosis not present

## 2015-06-11 DIAGNOSIS — F329 Major depressive disorder, single episode, unspecified: Secondary | ICD-10-CM | POA: Diagnosis not present

## 2015-06-16 DIAGNOSIS — I252 Old myocardial infarction: Secondary | ICD-10-CM | POA: Diagnosis not present

## 2015-06-16 DIAGNOSIS — L89313 Pressure ulcer of right buttock, stage 3: Secondary | ICD-10-CM | POA: Diagnosis not present

## 2015-06-16 DIAGNOSIS — G8222 Paraplegia, incomplete: Secondary | ICD-10-CM | POA: Diagnosis not present

## 2015-06-16 DIAGNOSIS — S82001D Unspecified fracture of right patella, subsequent encounter for closed fracture with routine healing: Secondary | ICD-10-CM | POA: Diagnosis not present

## 2015-06-16 DIAGNOSIS — F329 Major depressive disorder, single episode, unspecified: Secondary | ICD-10-CM | POA: Diagnosis not present

## 2015-06-16 DIAGNOSIS — Z9181 History of falling: Secondary | ICD-10-CM | POA: Diagnosis not present

## 2015-06-18 DIAGNOSIS — Z9181 History of falling: Secondary | ICD-10-CM | POA: Diagnosis not present

## 2015-06-18 DIAGNOSIS — G8222 Paraplegia, incomplete: Secondary | ICD-10-CM | POA: Diagnosis not present

## 2015-06-18 DIAGNOSIS — F329 Major depressive disorder, single episode, unspecified: Secondary | ICD-10-CM | POA: Diagnosis not present

## 2015-06-18 DIAGNOSIS — L89313 Pressure ulcer of right buttock, stage 3: Secondary | ICD-10-CM | POA: Diagnosis not present

## 2015-06-18 DIAGNOSIS — I252 Old myocardial infarction: Secondary | ICD-10-CM | POA: Diagnosis not present

## 2015-06-18 DIAGNOSIS — S82001D Unspecified fracture of right patella, subsequent encounter for closed fracture with routine healing: Secondary | ICD-10-CM | POA: Diagnosis not present

## 2015-06-25 ENCOUNTER — Telehealth: Payer: Self-pay | Admitting: Family Medicine

## 2015-06-25 DIAGNOSIS — Z9181 History of falling: Secondary | ICD-10-CM | POA: Diagnosis not present

## 2015-06-25 DIAGNOSIS — G8222 Paraplegia, incomplete: Secondary | ICD-10-CM | POA: Diagnosis not present

## 2015-06-25 DIAGNOSIS — F329 Major depressive disorder, single episode, unspecified: Secondary | ICD-10-CM | POA: Diagnosis not present

## 2015-06-25 DIAGNOSIS — L89313 Pressure ulcer of right buttock, stage 3: Secondary | ICD-10-CM | POA: Diagnosis not present

## 2015-06-25 DIAGNOSIS — I252 Old myocardial infarction: Secondary | ICD-10-CM | POA: Diagnosis not present

## 2015-06-25 DIAGNOSIS — S82001D Unspecified fracture of right patella, subsequent encounter for closed fracture with routine healing: Secondary | ICD-10-CM | POA: Diagnosis not present

## 2015-06-25 NOTE — Telephone Encounter (Signed)
Fyi.........[facility] saying [facility] has closed and the are discharging him from home health care.   Thanks,  Barth Kirks

## 2015-06-28 ENCOUNTER — Encounter (INDEPENDENT_AMBULATORY_CARE_PROVIDER_SITE_OTHER): Payer: Medicare Other | Admitting: Family Medicine

## 2015-06-28 DIAGNOSIS — G8222 Paraplegia, incomplete: Secondary | ICD-10-CM | POA: Diagnosis not present

## 2015-06-28 DIAGNOSIS — L89313 Pressure ulcer of right buttock, stage 3: Secondary | ICD-10-CM

## 2015-06-28 DIAGNOSIS — S82001D Unspecified fracture of right patella, subsequent encounter for closed fracture with routine healing: Secondary | ICD-10-CM | POA: Diagnosis not present

## 2015-06-28 DIAGNOSIS — F329 Major depressive disorder, single episode, unspecified: Secondary | ICD-10-CM

## 2015-07-02 DIAGNOSIS — N390 Urinary tract infection, site not specified: Secondary | ICD-10-CM | POA: Diagnosis not present

## 2015-07-02 DIAGNOSIS — N136 Pyonephrosis: Secondary | ICD-10-CM | POA: Diagnosis not present

## 2015-07-14 DIAGNOSIS — I499 Cardiac arrhythmia, unspecified: Secondary | ICD-10-CM | POA: Insufficient documentation

## 2015-07-14 DIAGNOSIS — K635 Polyp of colon: Secondary | ICD-10-CM | POA: Insufficient documentation

## 2015-07-14 DIAGNOSIS — Z8619 Personal history of other infectious and parasitic diseases: Secondary | ICD-10-CM | POA: Insufficient documentation

## 2015-07-14 DIAGNOSIS — R739 Hyperglycemia, unspecified: Secondary | ICD-10-CM | POA: Insufficient documentation

## 2015-07-14 DIAGNOSIS — K219 Gastro-esophageal reflux disease without esophagitis: Secondary | ICD-10-CM | POA: Insufficient documentation

## 2015-07-14 DIAGNOSIS — F32A Depression, unspecified: Secondary | ICD-10-CM | POA: Insufficient documentation

## 2015-07-14 DIAGNOSIS — K589 Irritable bowel syndrome without diarrhea: Secondary | ICD-10-CM | POA: Insufficient documentation

## 2015-07-14 DIAGNOSIS — F329 Major depressive disorder, single episode, unspecified: Secondary | ICD-10-CM | POA: Insufficient documentation

## 2015-07-14 DIAGNOSIS — E559 Vitamin D deficiency, unspecified: Secondary | ICD-10-CM | POA: Insufficient documentation

## 2015-07-14 HISTORY — DX: Polyp of colon: K63.5

## 2015-08-02 ENCOUNTER — Encounter: Payer: Self-pay | Admitting: Family Medicine

## 2015-08-02 ENCOUNTER — Ambulatory Visit (INDEPENDENT_AMBULATORY_CARE_PROVIDER_SITE_OTHER): Payer: Medicare Other | Admitting: Family Medicine

## 2015-08-02 VITALS — BP 124/70 | HR 54 | Temp 98.4°F | Resp 16

## 2015-08-02 DIAGNOSIS — E559 Vitamin D deficiency, unspecified: Secondary | ICD-10-CM | POA: Diagnosis not present

## 2015-08-02 DIAGNOSIS — Z720 Tobacco use: Secondary | ICD-10-CM

## 2015-08-02 DIAGNOSIS — Z125 Encounter for screening for malignant neoplasm of prostate: Secondary | ICD-10-CM | POA: Diagnosis not present

## 2015-08-02 DIAGNOSIS — G8220 Paraplegia, unspecified: Secondary | ICD-10-CM

## 2015-08-02 DIAGNOSIS — Z0000 Encounter for general adult medical examination without abnormal findings: Secondary | ICD-10-CM | POA: Diagnosis not present

## 2015-08-02 DIAGNOSIS — L8990 Pressure ulcer of unspecified site, unspecified stage: Secondary | ICD-10-CM | POA: Diagnosis not present

## 2015-08-02 DIAGNOSIS — F17210 Nicotine dependence, cigarettes, uncomplicated: Secondary | ICD-10-CM | POA: Insufficient documentation

## 2015-08-02 MED ORDER — HYOSCYAMINE SULFATE 0.125 MG SL SUBL: 0.1250 mg | SUBLINGUAL_TABLET | SUBLINGUAL | Status: DC | PRN

## 2015-08-02 NOTE — Progress Notes (Signed)
Patient: Lee Clark, Male    DOB: 06-Apr-1956, 60 y.o.   MRN: [id number] Visit Date: 08/02/2015  Today's Provider: [name], MD   Chief Complaint  Patient presents with  . Medicare Wellness   Subjective:    Annual wellness visit Lee Clark is a 60 y.o. male. He feels well. He reports exercising none. He reports he is sleeping fairly well.  -----------------------------------------------------------   Follow-up for elevated blood pressure from 09/28/2014; no changes, labs ordered. Follow-up for Vitamin D deficiency from 09/28/2014; started Vitamin D 50,000 units weekly. He states he is no longer taking vitamin d supplements, but does take a daily multivitamin.     Review of Systems  HENT: Negative.   Eyes: Negative.   Respiratory: Negative.   Cardiovascular: Positive for palpitations.  Gastrointestinal: Positive for constipation.  Endocrine: Negative.   Genitourinary: Positive for enuresis.  Musculoskeletal: Positive for myalgias, back pain and neck stiffness.  Skin: Positive for wound.  Allergic/Immunologic: Negative.   Neurological: Positive for weakness.  Hematological: Negative.   Psychiatric/Behavioral: Negative.     Social History   Social History  . Marital Status: Divorced    Spouse Name: N/A  . Number of Children: N/A  . Years of Education: N/A   Occupational History  . Not on file.   Social History Main Topics  . Smoking status: Current Every Day Smoker -- 2.00 packs/day  . Smokeless tobacco: Never Used  . Alcohol Use: No  . Drug Use: Yes    Special: Marijuana  . Sexual Activity: Not on file     Comment: occasionally   Other Topics Concern  . Not on file   Social History Narrative    Past Medical History  Diagnosis Date  . Stroke (HCC)     1970's  . Paraplegia following spinal cord injury (HCC)     partial with minimal movement, sensation from injury at L1 level     Patient Active Problem List   Diagnosis Date Noted    . Colon polyp 07/14/2015  . Clinical depression 07/14/2015  . Blood glucose elevated 07/14/2015  . Esophageal reflux 07/14/2015  . Personal history of other infectious and parasitic diseases 07/14/2015  . Adaptive colitis 07/14/2015  . Irregular cardiac rhythm 07/14/2015  . Paraplegia (HCC) 07/14/2015  . Avitaminosis D 07/14/2015  . Decubitus ulcer 11/26/2014  . Tibial plateau fracture, left 11/26/2014    Past Surgical History  Procedure Laterality Date  . Back surgery      gangrene on bilateral buttoks  . Cholecystectomy    . Buttock mass excision      bilateral, due to pressure sore that became gangrene    His family history includes Cancer in his mother; Diabetes in his mother; Hypertension in his father; Lupus in his sister; Stroke in his father.    Previous Medications   ACETAMINOPHEN-CODEINE (TYLENOL #3) 300-30 MG PER TABLET    Take 1 tablet by mouth every 4 (four) hours as needed for moderate pain.   ASPIRIN EC 81 MG TABLET    Take 1 tablet (81 mg total) by mouth daily.   CHOLESTYRAMINE LIGHT (PREVALITE) 4 GM/DOSE POWDER    MIX ONE (1) SCOOPFUL IN WATER & TAKE UP TO FOUR TIMES PER DAY AS NEEDED (1 SCOOPFUL CONTAINS 4 GRAMS OF CHOLESTYRAMINE)   DOXYCYCLINE (VIBRA-TABS) 100 MG TABLET    Take 1 tablet (100 mg total) by mouth 2 (two) times daily.   HYDROCODONE-ACETAMINOPHEN (NORCO) 5-325 MG PER  TABLET    Take 1 tablet by mouth every 6 (six) hours as needed for moderate pain.   HYOSCYAMINE SULFATE (HYOSCYAMINE PO)    Take by mouth.   ORPHENADRINE (NORFLEX) 100 MG TABLET    Take 1 tablet (100 mg total) by mouth 2 (two) times daily.    Patient Care Team: Malva Limesonald E Skarlett Sedlacek, MD as PCP - General (Family Medicine)     Objective:   Vitals: BP 124/70 mmHg  Pulse 54  Temp(Src) 98.4 F (36.9 C) (Oral)  Resp 16  Wt   SpO2 97%  Physical Exam    General Appearance:    Alert, cooperative, no distress  Eyes:    PERRL, conjunctiva/corneas clear, EOM's intact       Lungs:      Clear to auscultation bilaterally, respirations unlabored  Heart:    Regular rate and rhythm  Neurologic:   Awake, alert, oriented x 3. No apparent focal neurological           defect. Confined to wheelchair due to paraplegia.       Activities of Daily Living In your present state of health, do you have any difficulty performing the following activities: 08/02/2015  Hearing? N  Vision? N  Difficulty concentrating or making decisions? N  Walking or climbing stairs? Y  Dressing or bathing? N  Doing errands, shopping? Y    Fall Risk Assessment Fall Risk  08/02/2015  Falls in the past year? No     Depression Screen PHQ 2/9 Scores 08/02/2015  PHQ - 2 Score 0  PHQ- 9 Score 1     Audit-C Alcohol Use Screening  Question Answer Points  How often do you have alcoholic drink? never 0  On days you do drink alcohol, how many drinks do you typically consume? n/a 0  How oftey will you drink 6 or more in a total? never 0  Total Score:  0   A score of 3 or more in women, and 4 or more in men indicates increased risk for alcohol abuse, EXCEPT if all of the points are from question 1.    Assessment & Plan:     Annual Wellness Visit  Reviewed patient's Family Medical History Reviewed and updated list of patient's medical providers Assessment of cognitive impairment was done Assessed patient's functional ability Established a written schedule for health screening services Health Risk Assessent Completed and Reviewed  Exercise Activities and Dietary recommendations Goals    None       There is no immunization history on file for this patient.  Health Maintenance  Topic Date Due  . Hepatitis C Screening  02-04-1956  . HIV Screening  12/16/1971  . COLONOSCOPY  12/16/2006  . INFLUENZA VACCINE  01/19/2017 (Originally 12/21/2014)  . TETANUS/TDAP  01/19/2017 (Originally 12/16/1975)      Discussed health benefits of physical activity, and encouraged him to engage in regular  exercise appropriate for his age and condition.    --------------------------------------------------------------------------------  1. Medicare annual wellness visit, subsequent   2. Paraplegia (HCC) Stable.   3. Vitamin D deficiency Off of vitamin D. He thinks the numbness in his fingers was due to carpal tunnel and not related to vitamin D levels. He states insurance did not cover blood test for vitamin d and does not want to check it again. Will see if we can get [facility] to resubmit billing for last vitamin D level.   4. Prostate cancer screening  - PSA  5. Tobacco abuse Counseled  to quit smoking. He states Chantix did not help. He will think about Zyban if he can get his live-in girlfriend to quit smoking at the same time.   Counseled that he should have pneumonia vaccine which he refused today.  6. Decubitus ulcer He brinks in picture of small shallow ulceration that recently showed up on buttocks. He has extensive experience caring for these, but agrees to call back of home health treatment if not resolving over the next few weeks.

## 2015-08-02 NOTE — Patient Instructions (Addendum)
Please quit smoking  Screening for lung cancer is recommended for people between 355 and 59 years of age who have smoked at 1 pack per day or more for at least 30 years. Please call our office at [phone] to schedule a low dose CT lung scan for lung cancer screening.   .You are due for a pneumonia vaccine. Please call to schedule this when are ready

## 2015-08-03 LAB — PSA: Prostate Specific Ag, Serum: 1 ng/mL (ref 0.0–4.0)

## 2015-08-11 ENCOUNTER — Telehealth: Payer: Self-pay | Admitting: Family Medicine

## 2015-08-11 NOTE — Telephone Encounter (Signed)
He can do Cologuard test

## 2015-08-11 NOTE — Telephone Encounter (Signed)
Pt said he just received a call automated from insurance saying he was due a colon screening.  He said he just had a physical with you.  He does not want to do a colonoscopy but will do the card screening.    His call back is [phone]  Thanks, Barth Kirkseri

## 2015-08-11 NOTE — Telephone Encounter (Signed)
Please advise 

## 2015-08-12 NOTE — Telephone Encounter (Signed)
Maralyn SagoSarah, how do I go about doing this? Do I need to put a order in? Thanks!

## 2015-08-16 ENCOUNTER — Telehealth: Payer: Self-pay | Admitting: Family Medicine

## 2015-08-16 DIAGNOSIS — L89321 Pressure ulcer of left buttock, stage 1: Secondary | ICD-10-CM

## 2015-08-16 NOTE — Telephone Encounter (Signed)
Ok for home health referral for round care pressure sore buttocks.

## 2015-08-16 NOTE — Telephone Encounter (Signed)
Please advise 

## 2015-08-16 NOTE — Telephone Encounter (Signed)
Pt is requesting home health care for sore on right buttock.He states that it is not infected or draining but does have a scab on it.He states that [facility] has been out to home in the past.Call back # [phone]

## 2015-08-17 NOTE — Telephone Encounter (Signed)
Order for cologuard faxed to [facility] °

## 2015-08-19 ENCOUNTER — Telehealth: Payer: Self-pay

## 2015-08-19 NOTE — Telephone Encounter (Signed)
Just FYI Crystal from [facility] home health called to say that she removed patient's scab, and the wound was completely healed. She just wanted to make Dr. Sherrie MustacheFisher aware.

## 2015-08-24 ENCOUNTER — Encounter: Payer: Self-pay | Admitting: Family Medicine

## 2015-08-26 ENCOUNTER — Telehealth: Payer: Self-pay | Admitting: *Deleted

## 2015-08-26 MED ORDER — HYOSCYAMINE SULFATE ER 0.375 MG PO TB12: 0.3750 mg | ORAL_TABLET | Freq: Two times a day (BID) | ORAL | Status: DC

## 2015-08-26 NOTE — Telephone Encounter (Signed)
Patient called office requesting refill for Hyoscyamine. Patient wanted to know why dose has changed? He used to take hyoscyamine er 0.375 mg. Now his med is 0.125 mg. Patient is requesting that medication be changed back to 0.375 mg. Please advise?

## 2016-02-09 ENCOUNTER — Other Ambulatory Visit: Payer: Self-pay | Admitting: Family Medicine

## 2016-05-05 ENCOUNTER — Telehealth: Payer: Self-pay | Admitting: Family Medicine

## 2016-05-05 NOTE — Telephone Encounter (Signed)
MEDICARE PT Called Pt to schedule AWV with [facility] for 3/14 @ 8:45am cpe for 10am sched as OV - knb

## 2016-05-05 NOTE — Telephone Encounter (Signed)
Pt stated that he received a letter requesting him to appear for jury duty. Pt stated that he isn't physically able to go to jury duty and would like Dr. Sherrie MustacheFisher to write a letter stating that he can't attend jury duty and mail it to his home address. Please advise. Thanks TNP

## 2016-05-05 NOTE — Telephone Encounter (Signed)
Please review-aa 

## 2016-05-05 NOTE — Telephone Encounter (Signed)
Spoke with patient, and he reports that he is only available to do his visits on Monday. AWV was scheduled on 3/18 with Dr. Sherrie MustacheFisher for patient convenience.

## 2016-05-19 ENCOUNTER — Other Ambulatory Visit: Payer: Self-pay | Admitting: Family Medicine

## 2016-06-12 ENCOUNTER — Other Ambulatory Visit: Payer: Self-pay | Admitting: Family Medicine

## 2016-07-20 ENCOUNTER — Telehealth: Payer: Self-pay | Admitting: Family Medicine

## 2016-07-20 NOTE — Telephone Encounter (Signed)
Please review. Thanks!  

## 2016-07-20 NOTE — Telephone Encounter (Signed)
Called Pt to schedule AWV with [facility] - knb °

## 2016-07-20 NOTE — Telephone Encounter (Signed)
I have no idea what this is about

## 2016-07-20 NOTE — Telephone Encounter (Signed)
Pt has an appt with Dr. Sherrie MustacheFisher on the 19th of March which is a Monday.  This is the only day he can come in.  He said Natalia LeatherwoodKatherine called to add a nurse visit to that appt.  We do not have Monday Nurse adv. Appts.  Thanks Barth Kirkseri

## 2016-07-21 NOTE — Telephone Encounter (Signed)
I guess she is saying that the patient scheduled a F/U appt with you on 3/19 because this is the only day he can come in. Natalia LeatherwoodKatherine (Nurse care guide) also called to schedule an appt for his AWV. The patient is wanting to do them both on the 19th, but nothing is available on their schedule.  Please send the message back to [facility] nurse so I can re-route it to Natalia LeatherwoodKatherine so they can figure out another day the patient can come in. After looking into it, this has nothing to do with our schedule. What is written above is just a FYI. Thanks!

## 2016-08-07 ENCOUNTER — Encounter: Payer: Self-pay | Admitting: *Deleted

## 2016-08-07 ENCOUNTER — Ambulatory Visit (INDEPENDENT_AMBULATORY_CARE_PROVIDER_SITE_OTHER): Payer: Medicare Other | Admitting: Family Medicine

## 2016-08-07 ENCOUNTER — Encounter: Payer: Self-pay | Admitting: Family Medicine

## 2016-08-07 VITALS — BP 130/70 | HR 58 | Temp 98.5°F | Resp 16

## 2016-08-07 DIAGNOSIS — R636 Underweight: Secondary | ICD-10-CM

## 2016-08-07 DIAGNOSIS — E559 Vitamin D deficiency, unspecified: Secondary | ICD-10-CM | POA: Diagnosis not present

## 2016-08-07 DIAGNOSIS — Z0000 Encounter for general adult medical examination without abnormal findings: Secondary | ICD-10-CM

## 2016-08-07 DIAGNOSIS — R634 Abnormal weight loss: Secondary | ICD-10-CM

## 2016-08-07 DIAGNOSIS — Z720 Tobacco use: Secondary | ICD-10-CM | POA: Diagnosis not present

## 2016-08-07 NOTE — Progress Notes (Signed)
Patient: Lee Clark, Male    DOB: February 15, 1956, 61 y.o.   MRN: [id number] Visit Date: 08/07/2016  Today's Provider: Lelon Huh, MD   Chief Complaint  Patient presents with  . Annual Exam   Subjective:    Annual wellness visit Lee Clark is a 60 y.o. male. He feels fairly well. He reports exercising none. He reports he is sleeping well.  -----------------------------------------------------------  Paraplegia ([place]) From 08/02/2015-no changes, Stable.   Vitamin D deficiency From 08/02/2015-no changes.Off of vitamin D.   Tobacco abuse From 08/02/2015-Counseled to quit smoking.  Review of Systems  HENT: Positive for rhinorrhea.   Genitourinary: Positive for enuresis.  Musculoskeletal: Positive for neck stiffness.  Neurological: Positive for weakness and numbness.  Hematological: Bruises/bleeds easily.  All other systems reviewed and are negative.   Social History   Social History  . Marital status: Divorced    Spouse name: N/A  . Number of children: N/A  . Years of education: N/A   Occupational History  . Not on file.   Social History Main Topics  . Smoking status: Current Every Day Smoker    Packs/day: 2.00  . Smokeless tobacco: Never Used  . Alcohol use No  . Drug use: Yes    Types: Marijuana  . Sexual activity: Not on file     Comment: occasionally   Other Topics Concern  . Not on file   Social History Narrative  . No narrative on file    Past Medical History:  Diagnosis Date  . History of chicken pox   . Paraplegia following spinal cord injury ([place])    partial with minimal movement, sensation from injury at L1 level  . Shingles   . Stroke ([place])    1970's  . Tibial plateau fracture, left 11/26/2014     Patient Active Problem List   Diagnosis Date Noted  . Tobacco abuse 08/02/2015  . Colon polyp 07/14/2015  . Clinical depression 07/14/2015  . Blood glucose elevated 07/14/2015  . Esophageal reflux 07/14/2015  . Irritable  bowel syndrome 07/14/2015  . Vitamin D deficiency 07/14/2015  . Decubitus ulcer 11/26/2014    Past Surgical History:  Procedure Laterality Date  . BACK SURGERY     gangrene on bilateral buttoks  . BUTTOCK MASS EXCISION     bilateral, due to pressure sore that became gangrene  . CHOLECYSTECTOMY      His family history includes Cancer in his mother; Diabetes in his mother; Hypertension in his father; Lupus in his sister; Stroke in his father.      Current Outpatient Prescriptions:  .  cholestyramine light (PREVALITE) 4 GM/DOSE powder, MIX ONE (1) SCOOPFUL IN WATER & TAKE UP TO FOUR TIMES PER DAY AS NEEDED (1 SCOOPFUL CONTAINS 4 GRAMS OF CHOLESTYRAMINE), Disp: 420 g, Rfl: 4 .  hyoscyamine (LEVBID) 0.375 MG 12 hr tablet, Take 1 tablet (0.375 mg total) by mouth 2 (two) times daily., Disp: 60 tablet, Rfl: 3  Patient Care Team: Birdie Sons, MD as PCP - General (Family Medicine) Royston Cowper, MD (Urology)     Objective:   Vitals: BP 130/70 (BP Location: Left Arm, Patient Position: Sitting, Cuff Size: Normal)   Temp 98.5 F (36.9 C) (Oral)   Resp 16   Physical Exam   General Appearance:    Alert, cooperative, no distress, thin mail  Eyes:    PERRL, conjunctiva/corneas clear, EOM's intact       Lungs:  Clear to auscultation bilaterally, respirations unlabored  Heart:    Regular rate and rhythm  Neurologic:   Awake, alert, oriented x 3. No apparent focal neurological           defect.        Activities of Daily Living In your present state of health, do you have any difficulty performing the following activities: 08/07/2016  Hearing? N  Vision? N  Difficulty concentrating or making decisions? N  Walking or climbing stairs? Y  Dressing or bathing? N  Doing errands, shopping? Y  Some recent data might be hidden    Fall Risk Assessment Fall Risk  08/07/2016 08/02/2015  Falls in the past year? No No     Depression Screen PHQ 2/9 Scores 08/07/2016 08/02/2015  PHQ -  2 Score 0 0  PHQ- 9 Score 0 1    Audit-C Alcohol Use Screening  Question Answer Points  How often do you have alcoholic drink? never 0  On days you do drink alcohol, how many drinks do you typically consume? 0 0  How oftey will you drink 6 or more in a total? never 0  Total Score:  0   A score of 3 or more in women, and 4 or more in men indicates increased risk for alcohol abuse, EXCEPT if all of the points are from question 1.     Assessment & Plan:     Annual Wellness Visit  Reviewed patient's Family Medical History Reviewed and updated list of patient's medical providers Assessment of cognitive impairment was done Assessed patient's functional ability Established a written schedule for health screening [place] Completed and Reviewed  Exercise Activities and Dietary recommendations Goals    None       There is no immunization history on file for this patient.  Health Maintenance  Topic Date Due  . Hepatitis C Screening  [birth year]  . HIV Screening  12/16/1971  . COLONOSCOPY  12/16/2006  . INFLUENZA VACCINE  01/19/2017 (Originally 12/21/2015)  . TETANUS/TDAP  01/19/2017 (Originally 12/16/1975)     Discussed health benefits of physical activity, and encouraged him to engage in regular exercise appropriate for his age and condition.    -------------------------------------------------------------------------- 1. Medicare annual wellness visit, subsequent Generally doing well. Patient refused pneumovax, Tdap, and LDCT screening. He has cologuard kit at home  2. Tobacco abuse Counseled to stop smoking. He is not interested in medications.   3. Vitamin D deficiency  - VITAMIN D 25 Hydroxy (Vit-D Deficiency, Fractures)  4. Abnormal weight loss  - Comprehensive metabolic panel - CBC  The entirety of the information documented in the History of Present Illness, Review of Systems and Physical Exam were personally obtained by me. Portions  of this information were initially documented by Wilburt Finlay, CMA and reviewed by me for thoroughness and accuracy.    Lelon Huh, MD  [facility]

## 2016-08-07 NOTE — Patient Instructions (Addendum)
Screening for lung cancer is recommended for people between 7555 and 10780 years of age who have smoked at 1 pack per day or more for at least 30 years. Please call our office at [phone] to schedule a low dose CT lung scan for lung cancer screening.   I recommend that you get the Pneumovax-23 vaccine to protect yourself from certain strains of pneumonia. Please call our office at [phone] at your earliest convenience to schedule this vaccine.

## 2016-08-08 ENCOUNTER — Telehealth: Payer: Self-pay

## 2016-08-08 DIAGNOSIS — E559 Vitamin D deficiency, unspecified: Secondary | ICD-10-CM

## 2016-08-08 MED ORDER — VITAMIN D (ERGOCALCIFEROL) 1.25 MG (50000 UNIT) PO CAPS: 50000.0000 [IU] | ORAL_CAPSULE | ORAL | 4 refills | Status: DC

## 2016-08-08 NOTE — Telephone Encounter (Signed)
-----   Message from Malva Limesonald E [facility], MD sent at 08/08/2016  7:56 AM EDT ----- Vitamin D level is VERY low. Need to take OTC vitamin D 50,000 units once a week, #12, RF x 4. Recheck vitamin D level in 3 months.

## 2016-08-08 NOTE — Telephone Encounter (Signed)
Pt advised. Medication sent in. Emily Drozdowski, CMA  

## 2016-08-09 LAB — CBC
HEMATOCRIT: 41.8 % (ref 37.5–51.0)
HEMOGLOBIN: 14.4 g/dL (ref 13.0–17.7)
MCH: 29.9 pg (ref 26.6–33.0)
MCHC: 34.4 g/dL (ref 31.5–35.7)
MCV: 87 fL (ref 79–97)
Platelets: 209 10*3/uL (ref 150–379)
RBC: 4.81 x10E6/uL (ref 4.14–5.80)
RDW: 14.3 % (ref 12.3–15.4)
WBC: 10.6 10*3/uL (ref 3.4–10.8)

## 2016-08-09 LAB — VITAMIN D 25 HYDROXY (VIT D DEFICIENCY, FRACTURES): VIT D 25 HYDROXY: 9 ng/mL — AB (ref 30.0–100.0)

## 2016-08-09 LAB — COMPREHENSIVE METABOLIC PANEL
ALBUMIN: 4.3 g/dL (ref 3.5–5.5)
AST: 12 IU/L (ref 0–40)
Albumin/Globulin Ratio: 1.7 (ref 1.2–2.2)
Alkaline Phosphatase: 96 IU/L (ref 39–117)
BUN / CREAT RATIO: 24 — AB (ref 9–20)
BUN: 13 mg/dL (ref 6–24)
Bilirubin Total: 0.8 mg/dL (ref 0.0–1.2)
CALCIUM: 9.3 mg/dL (ref 8.7–10.2)
CHLORIDE: 104 mmol/L (ref 96–106)
CO2: 20 mmol/L (ref 18–29)
CREATININE: 0.55 mg/dL — AB (ref 0.76–1.27)
GFR, EST AFRICAN AMERICAN: 132 mL/min/{1.73_m2} (ref >59)
GFR, EST NON AFRICAN AMERICAN: 114 mL/min/{1.73_m2} (ref >59)
GLUCOSE: 90 mg/dL (ref 65–99)
Globulin, Total: 2.5 g/dL (ref 1.5–4.5)
Potassium: 3.9 mmol/L (ref 3.5–5.2)
Sodium: 142 mmol/L (ref 134–144)
TOTAL PROTEIN: 6.8 g/dL (ref 6.0–8.5)

## 2016-09-21 IMAGING — US US EXTREM LOW VENOUS*L*
1 series · 13 of 24 positions shown · non-contrast
Comparison: Left knee radiographs performed earlier today at [DATE]
p.m.

CLINICAL DATA: Chronic left knee swelling for 1 month. Initial
encounter.



[Series 1: us extrem low venous*left* · 0.07mm/px · 13 of 33 slices shown]
[im 1/33]
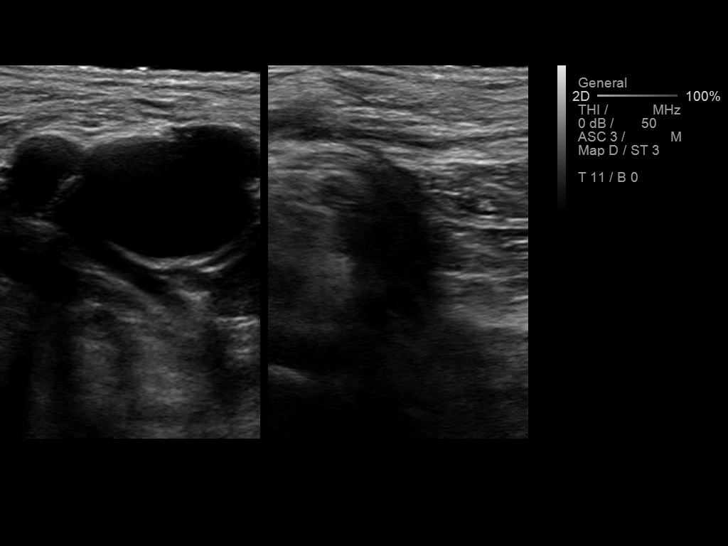
[im 3/33]
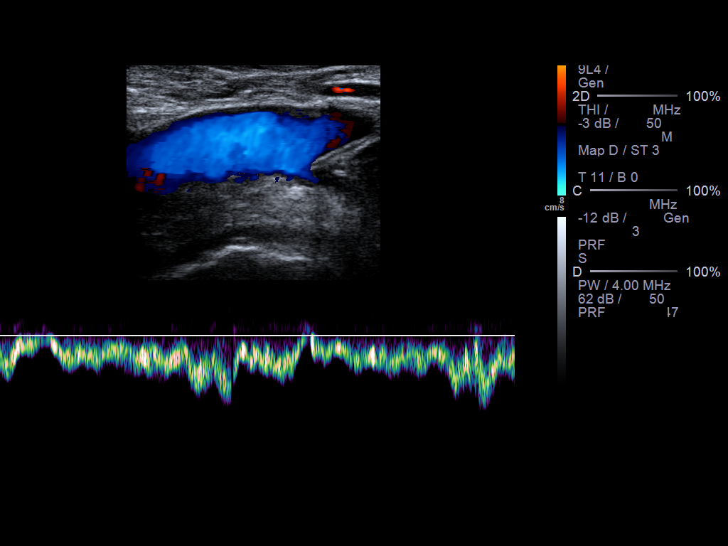
[im 6/33]
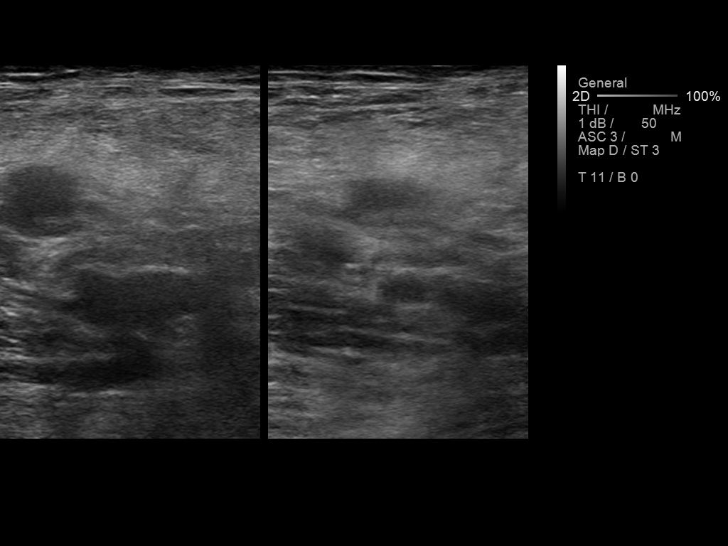
[im 9/33]
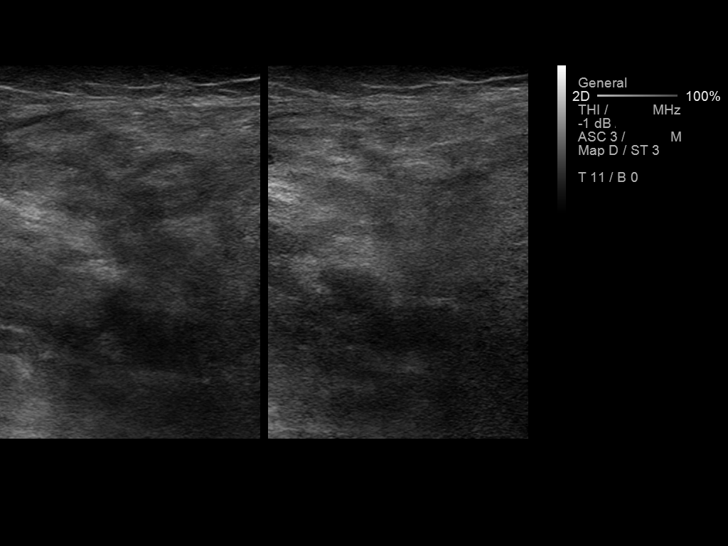
[im 12/33]
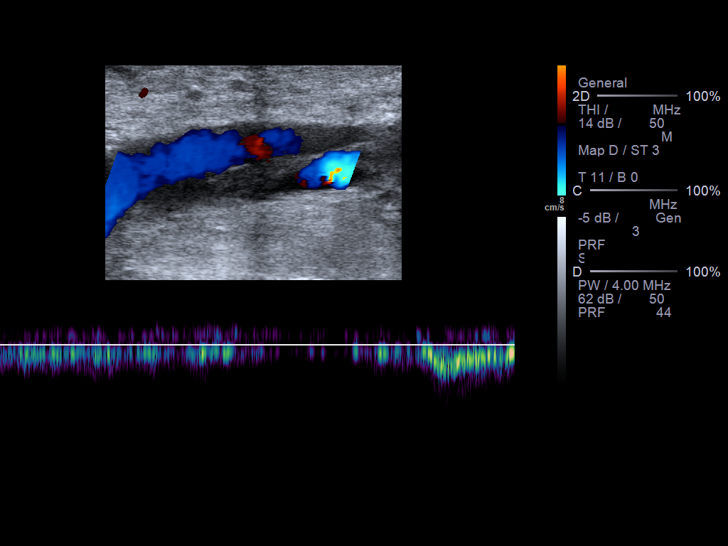
[im 14/33]
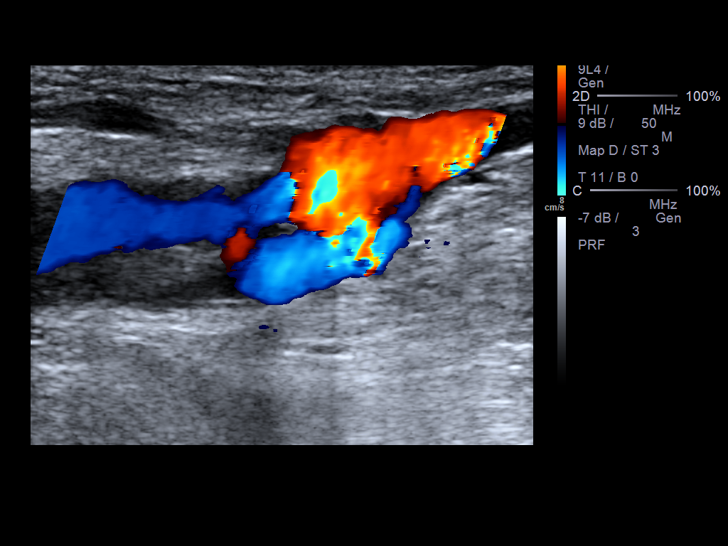
[im 17/33]
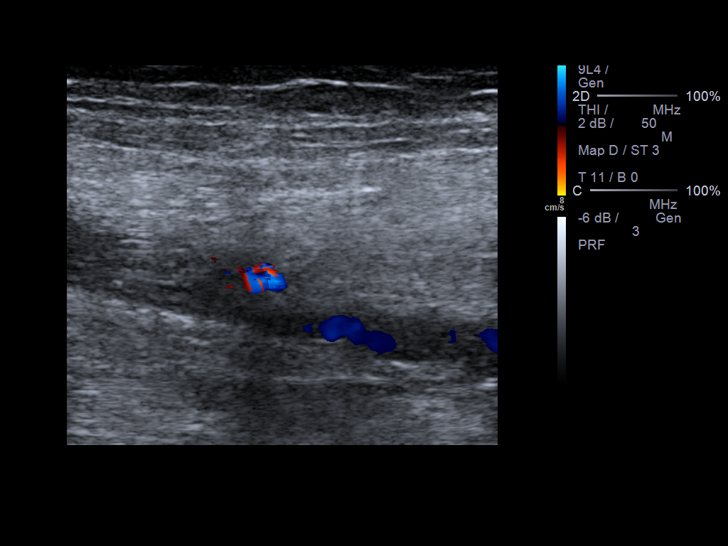
[im 19/33]
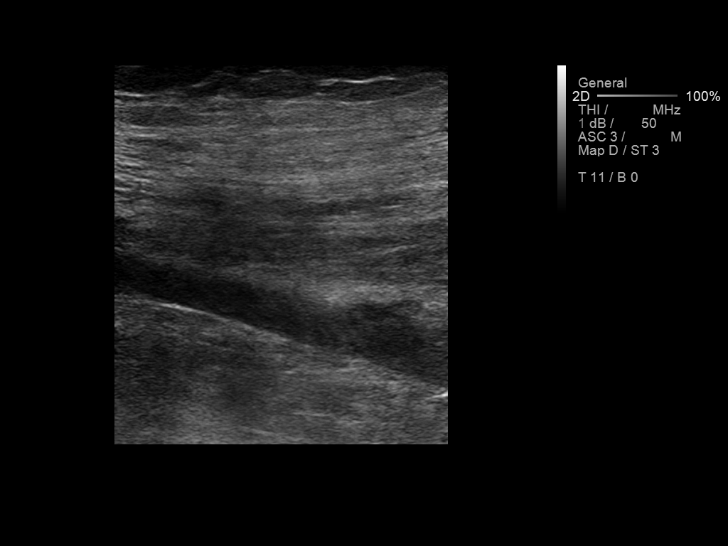
[im 21/33]
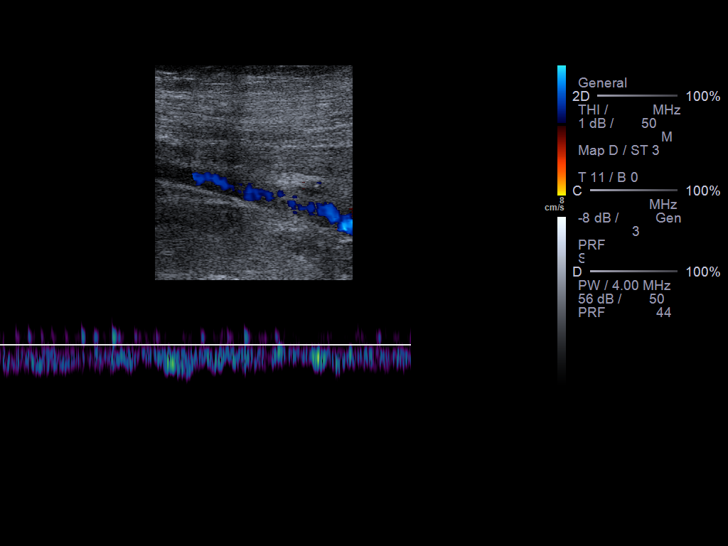
[im 24/33]
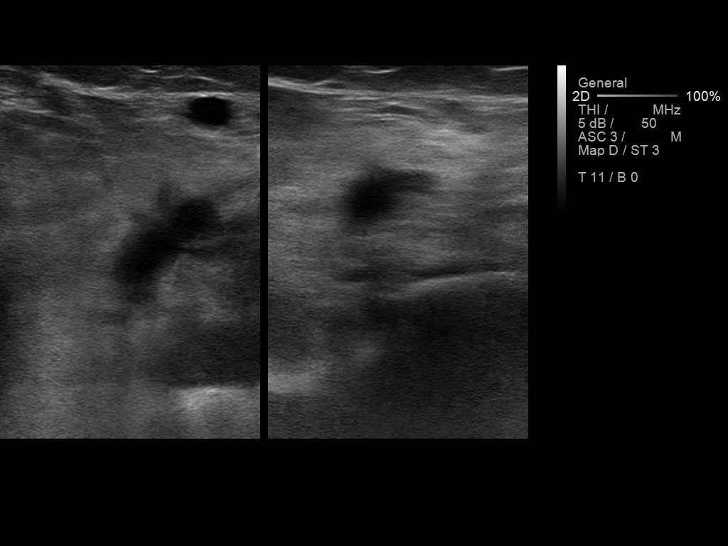
[im 27/33]
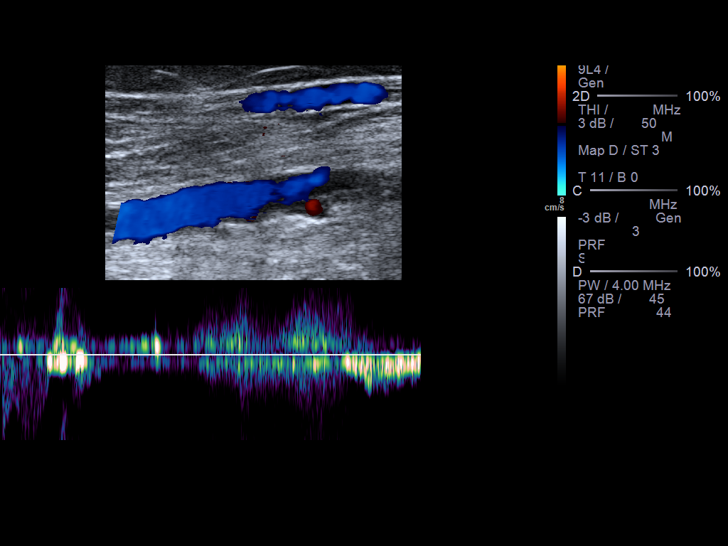
[im 30/33]
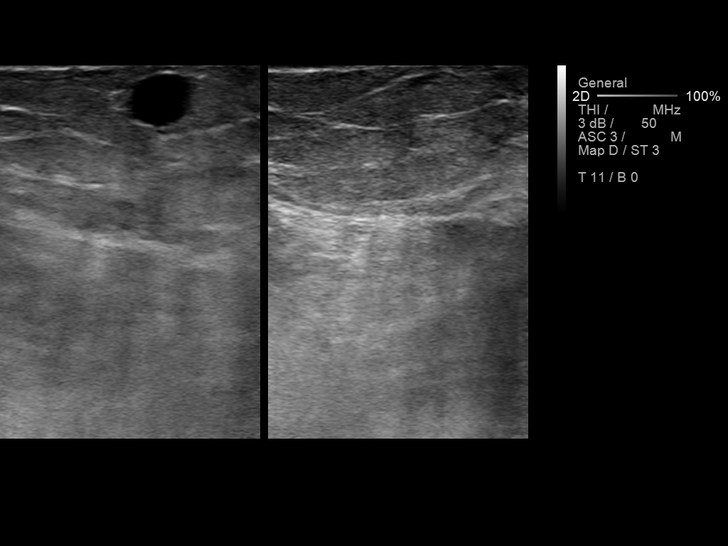
[im 33/33]
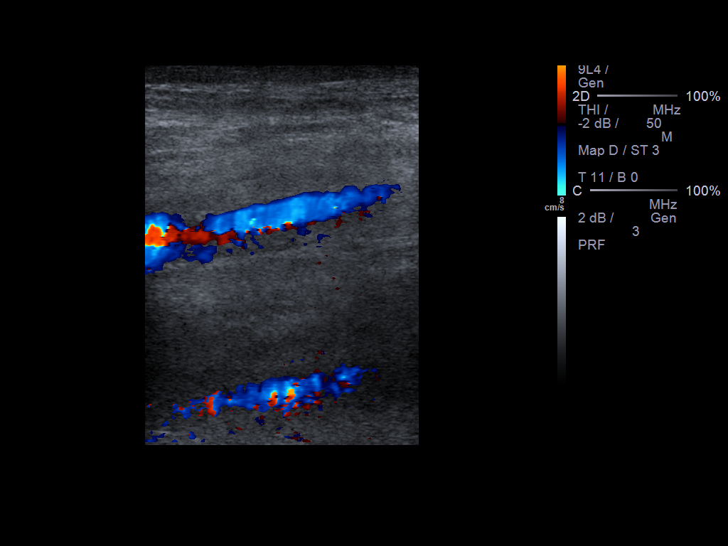

[13 of 24 positions shown; findings below may reference images not displayed]

FINDINGS: Contralateral Common Femoral Vein: Respiratory phasicity is normal
and symmetric with the symptomatic side. No evidence of thrombus.
Normal compressibility.

Common Femoral Vein: A small to moderate amount of nonocclusive
thrombus is noted within the common femoral vein.

Saphenofemoral Junction: No evidence of thrombus. Normal
compressibility and flow on color Doppler imaging.

Profunda Femoral Vein: No evidence of thrombus. Normal
compressibility and flow on color Doppler imaging.

Femoral Vein: Nonocclusive thrombus is noted within the femoral
vein.

Popliteal Vein: A small to moderate amount of nonocclusive thrombus
is noted within the popliteal vein.

Calf Veins: No evidence of thrombus. Normal compressibility and flow
on color Doppler imaging. The peroneal vein is only partially
characterized.

Superficial Great Saphenous Vein: No evidence of thrombus. Normal
compressibility and flow on color Doppler imaging.

Venous Reflux:  None.

Other Findings:  None.
IMPRESSION: Nonocclusive thrombus within the common femoral vein, femoral vein
and popliteal vein, likely relatively chronic in nature.

These results were called by telephone at the time of interpretation
on 11/25/2014 at [DATE] to Dr. RAJIV FONTES, who verbally
acknowledged these results.

## 2016-10-13 ENCOUNTER — Other Ambulatory Visit: Payer: Self-pay | Admitting: Family Medicine

## 2016-12-25 ENCOUNTER — Other Ambulatory Visit: Payer: Self-pay | Admitting: Family Medicine

## 2016-12-25 MED ORDER — CHOLESTYRAMINE LIGHT 4 GM/DOSE PO POWD: ORAL | 5 refills | Status: DC

## 2016-12-25 NOTE — Telephone Encounter (Signed)
Pt contacted office for refill request on the following medications:  cholestyramine light (PREVALITE) 4 GM/DOSE powder [facility].  WU#[phone]/NFB#[phone]/MW   Pt is needing to change the pharmacy due to insurance/MW

## 2016-12-28 ENCOUNTER — Encounter: Payer: Self-pay | Admitting: Family Medicine

## 2016-12-28 DIAGNOSIS — K529 Noninfective gastroenteritis and colitis, unspecified: Secondary | ICD-10-CM | POA: Insufficient documentation

## 2017-01-01 ENCOUNTER — Telehealth: Payer: Self-pay | Admitting: Family Medicine

## 2017-01-01 ENCOUNTER — Other Ambulatory Visit: Payer: Self-pay | Admitting: Family Medicine

## 2017-01-01 DIAGNOSIS — R109 Unspecified abdominal pain: Secondary | ICD-10-CM | POA: Diagnosis not present

## 2017-01-01 DIAGNOSIS — R531 Weakness: Secondary | ICD-10-CM | POA: Diagnosis not present

## 2017-01-01 DIAGNOSIS — R5383 Other fatigue: Secondary | ICD-10-CM | POA: Diagnosis not present

## 2017-01-01 DIAGNOSIS — R197 Diarrhea, unspecified: Secondary | ICD-10-CM | POA: Diagnosis not present

## 2017-01-01 DIAGNOSIS — K5900 Constipation, unspecified: Secondary | ICD-10-CM | POA: Diagnosis not present

## 2017-01-01 MED ORDER — CHOLESTYRAMINE LIGHT 4 GM/DOSE PO POWD: 4.0000 g | Freq: Four times a day (QID) | ORAL | 5 refills | Status: DC | PRN

## 2017-01-01 NOTE — Telephone Encounter (Signed)
He has no other symptoms like fevers, chills etc. He is having some abdominal cramping followed by the urge to urinated and then he has the diarrhea. He reports that he had these symptoms last weekend then all last week he had no BM after taking antidiarrheal medication, then last night he had diarrhea again.

## 2017-01-01 NOTE — Telephone Encounter (Signed)
Patient advised and verbally voiced understanding.  

## 2017-01-01 NOTE — Telephone Encounter (Signed)
Pt called saying he thinks he has an intestinal infection.  He is having constipation along with diarrhea.  Offered him an appt but he says he cant come in today.  He wants to know if something can be called in.  His call back is [phone]  [facility] [address] [facility] [facility]  ThanksTeri

## 2017-01-01 NOTE — Telephone Encounter (Signed)
He should take an OTC probiotic such as Align for about a week. Avoid dairy products. If not better within a week then will need office visit.
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
## 2017-01-18 IMAGING — CR DG CERVICAL SPINE 2 OR 3 VIEWS
1 series · 3 of 3 positions shown · non-contrast
Comparison: None.

CLINICAL DATA: Neck and lower back pain.  Paraplegic.

EXAM:
CERVICAL SPINE - 2-3 VIEW

[Series 1: t cervical spine 6-(id) (10-12cm) · 0.14mm/px · 3 of 3 slices shown]
[im 1/3]
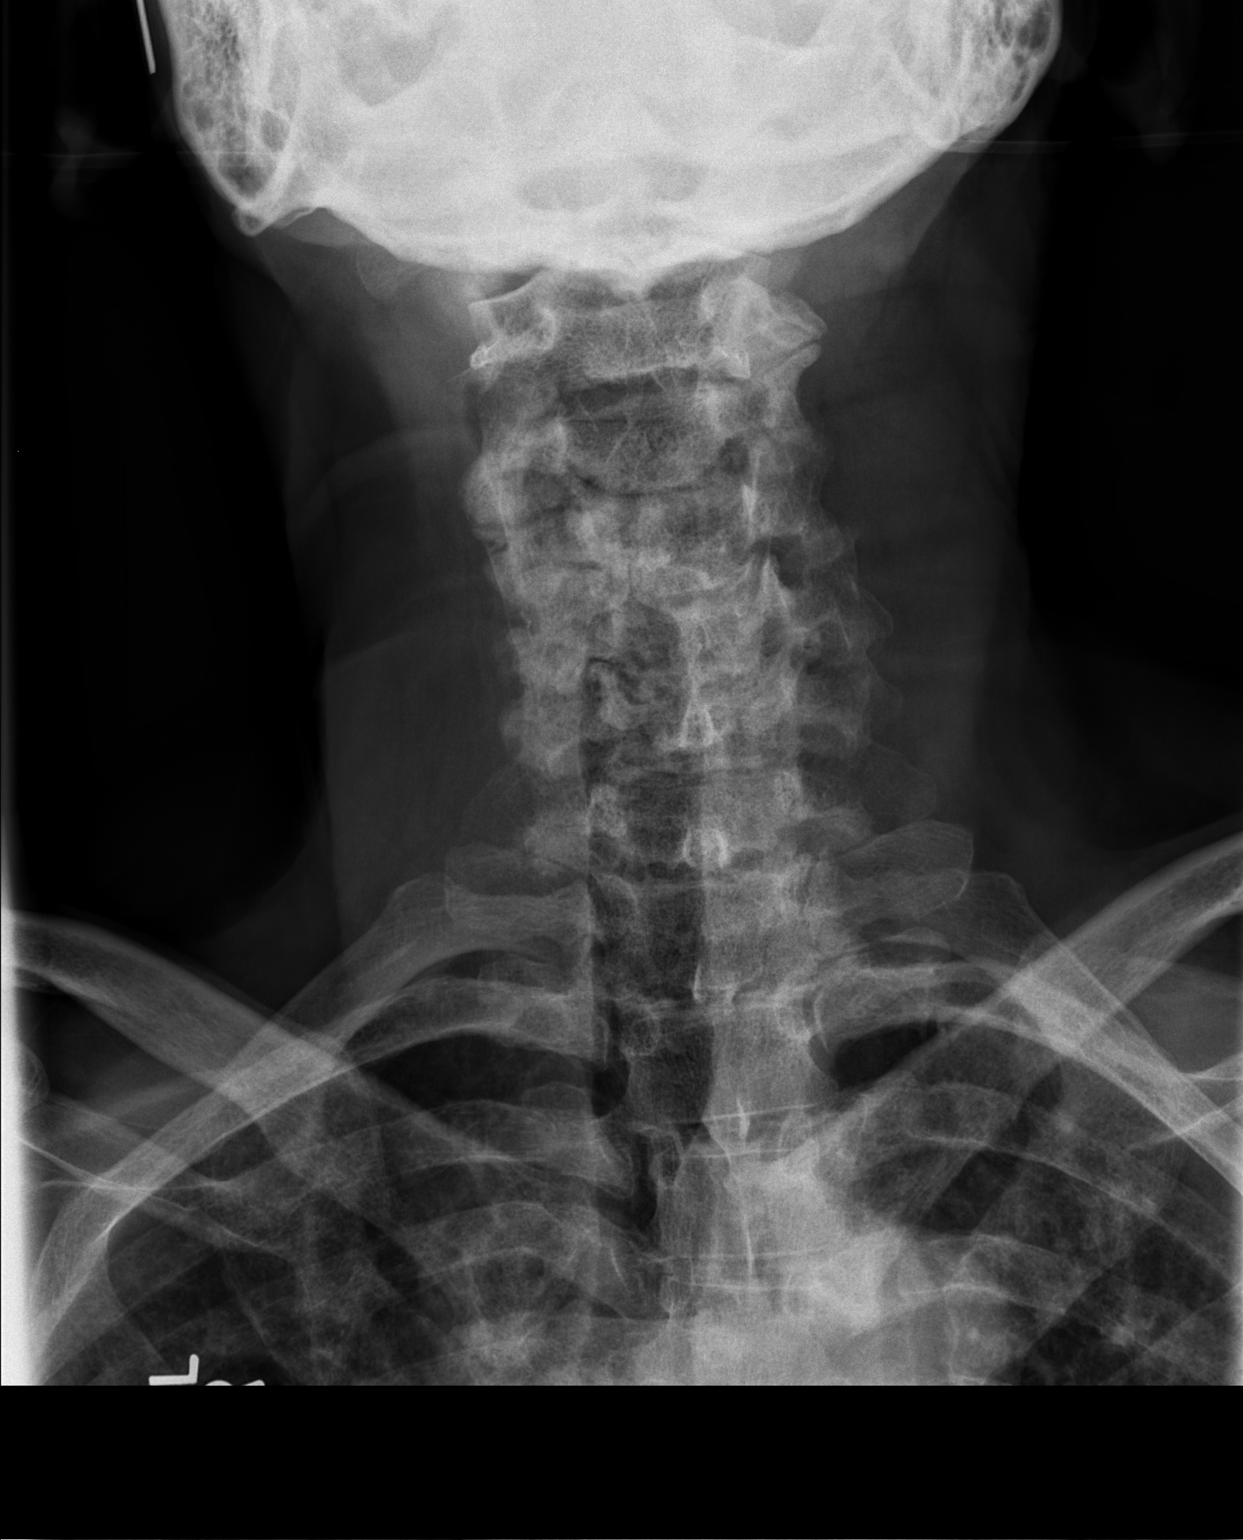
[im 2/3]
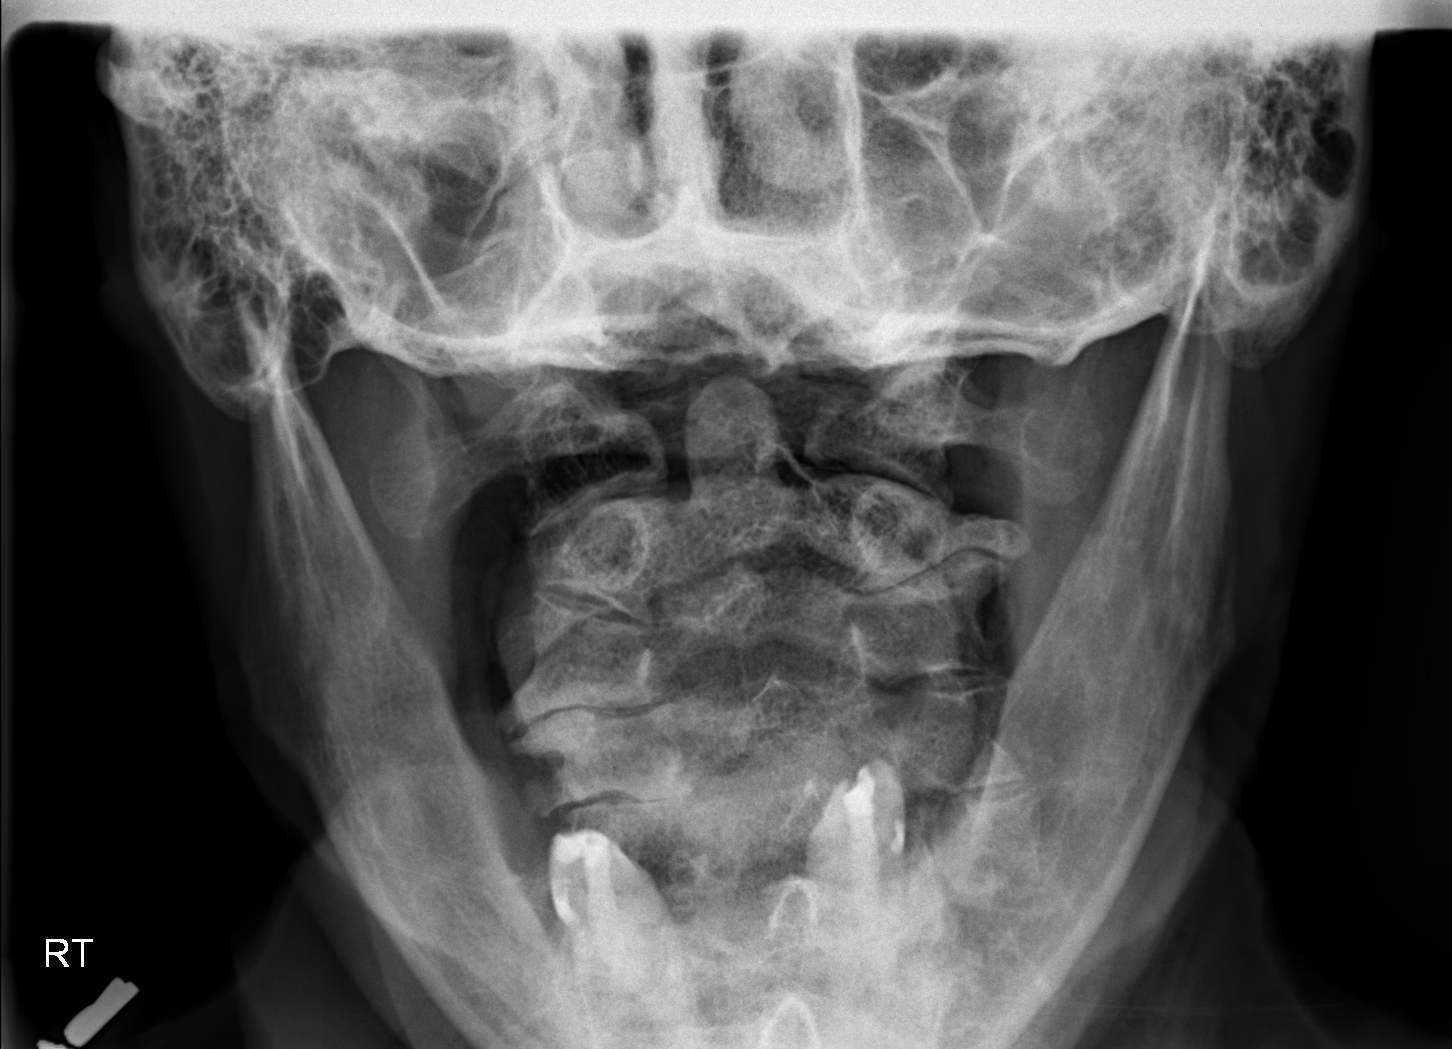
[im 3/3]
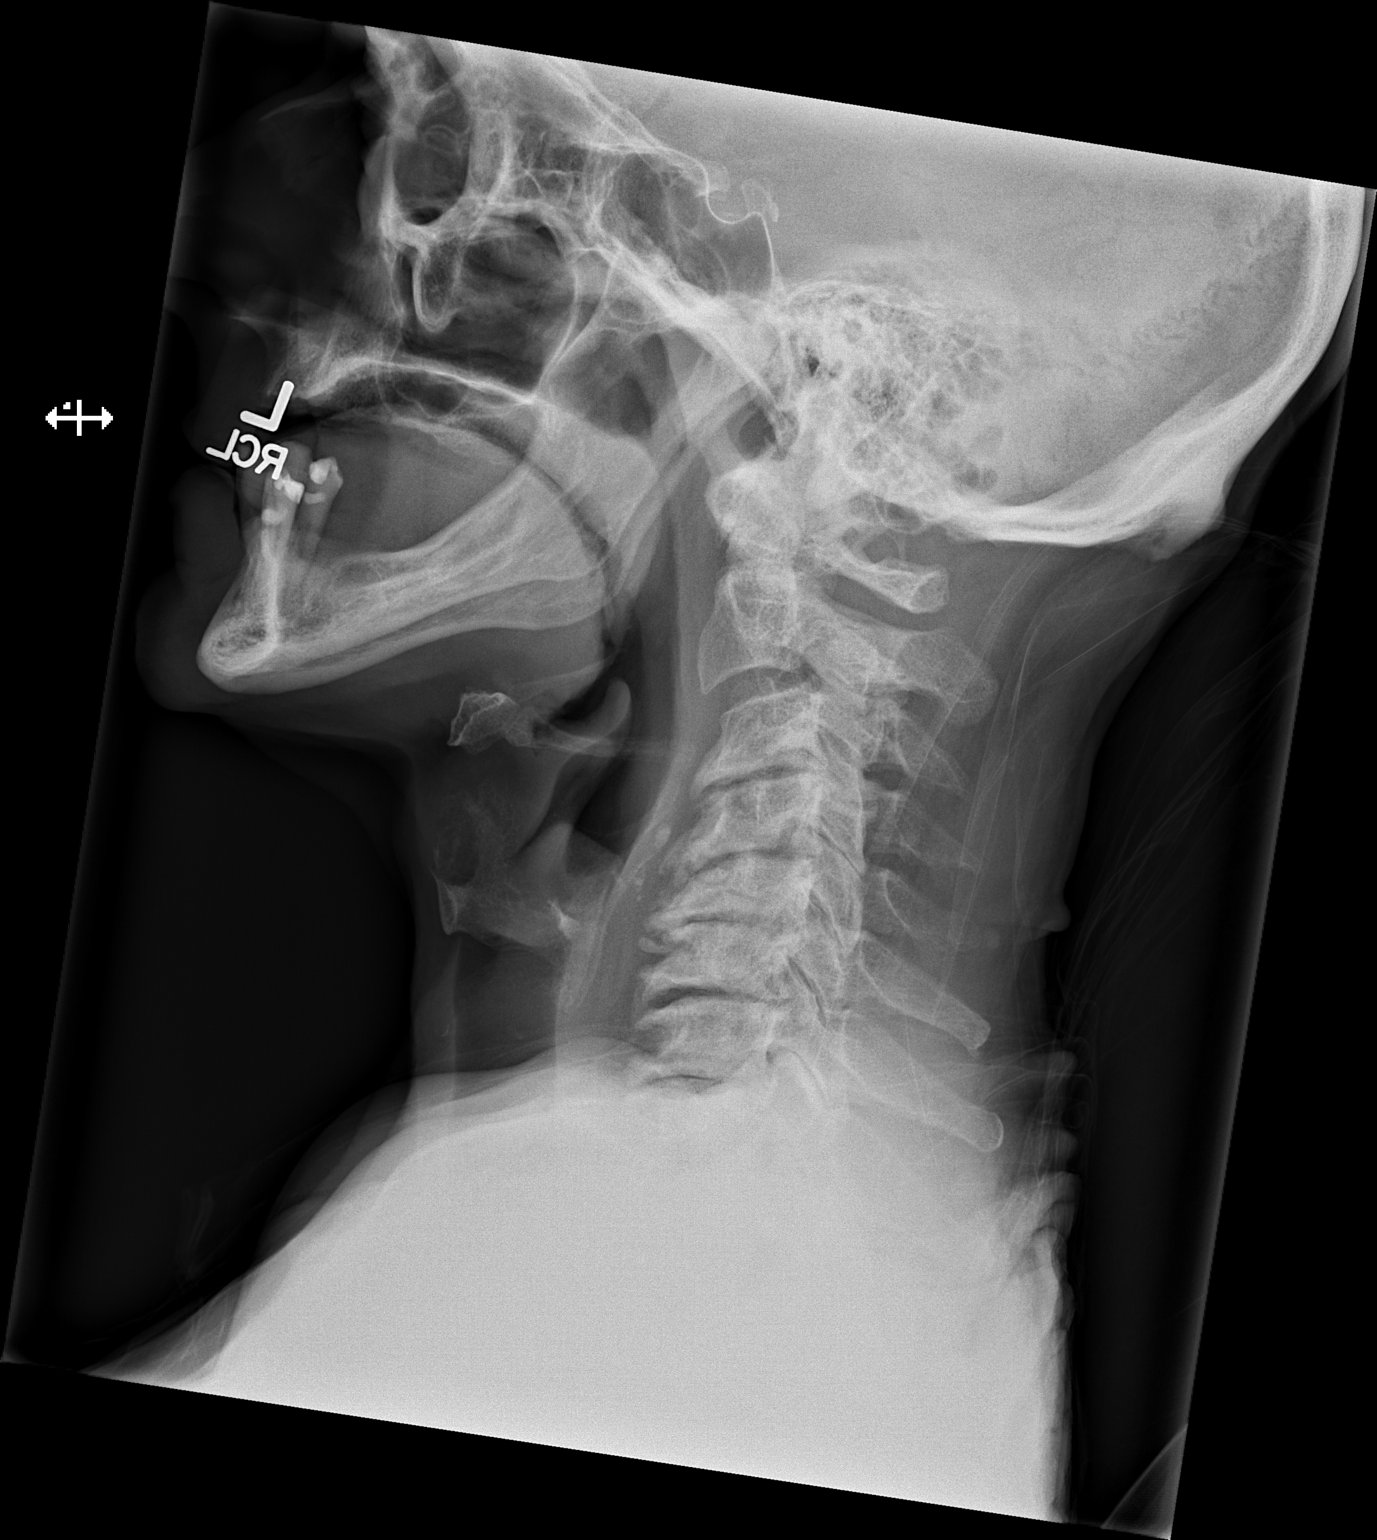

[3 of 3 positions shown; findings below may reference images not displayed]

FINDINGS: Advanced disc narrowing and endplate spurring at C3-4, C4-5, C5-6,
C6-7, and C7-T1. There is accompanying facet arthropathy in the
upper cervical spine which likely accounts for 3 mm of C2-3
anterolisthesis. There is no evidence of fracture, focal bone
lesion, or endplate erosion.
IMPRESSION: 1. Advanced cervical degenerative disc disease from C3-4 to C7-T1.
2. Upper cervical facet arthropathy with C2-3 anterolisthesis.

## 2017-01-23 ENCOUNTER — Other Ambulatory Visit: Payer: Self-pay | Admitting: Family Medicine

## 2017-01-23 DIAGNOSIS — R197 Diarrhea, unspecified: Secondary | ICD-10-CM | POA: Diagnosis not present

## 2017-01-23 DIAGNOSIS — R531 Weakness: Secondary | ICD-10-CM | POA: Diagnosis not present

## 2017-01-23 DIAGNOSIS — R109 Unspecified abdominal pain: Secondary | ICD-10-CM | POA: Diagnosis not present
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
## 2017-07-23 ENCOUNTER — Telehealth: Payer: Self-pay

## 2017-07-23 NOTE — Telephone Encounter (Signed)
LMTCB and schedule AWV prior to CPE on 08/13/17 with Dr Sherrie MustacheFisher.  -MM

## 2017-07-31 NOTE — Telephone Encounter (Signed)
LMTCB and schedule AWV prior to CPE on 08/13/17. -MM

## 2017-08-02 NOTE — Telephone Encounter (Signed)
Pt states he wants his awv and cpe on the same day. Offered pt a 1 oclock apt on the same day and pt declined. He stated his transportation is coming at 1. Pt states he will skip his AWV this year.  -MM

## 2017-08-13 ENCOUNTER — Ambulatory Visit (INDEPENDENT_AMBULATORY_CARE_PROVIDER_SITE_OTHER): Payer: Medicare Other | Admitting: Family Medicine

## 2017-08-13 ENCOUNTER — Encounter: Payer: Self-pay | Admitting: Family Medicine

## 2017-08-13 VITALS — BP 150/70 | HR 83 | Temp 98.9°F | Resp 16

## 2017-08-13 DIAGNOSIS — Z0000 Encounter for general adult medical examination without abnormal findings: Secondary | ICD-10-CM

## 2017-08-13 DIAGNOSIS — E559 Vitamin D deficiency, unspecified: Secondary | ICD-10-CM | POA: Diagnosis not present

## 2017-08-13 DIAGNOSIS — G8220 Paraplegia, unspecified: Secondary | ICD-10-CM | POA: Diagnosis not present

## 2017-08-13 DIAGNOSIS — Z1211 Encounter for screening for malignant neoplasm of colon: Secondary | ICD-10-CM | POA: Diagnosis not present

## 2017-08-13 DIAGNOSIS — Z125 Encounter for screening for malignant neoplasm of prostate: Secondary | ICD-10-CM | POA: Diagnosis not present

## 2017-08-13 DIAGNOSIS — F17210 Nicotine dependence, cigarettes, uncomplicated: Secondary | ICD-10-CM | POA: Diagnosis not present

## 2017-08-13 DIAGNOSIS — Z1159 Encounter for screening for other viral diseases: Secondary | ICD-10-CM

## 2017-08-13 DIAGNOSIS — Z136 Encounter for screening for cardiovascular disorders: Secondary | ICD-10-CM

## 2017-08-13 MED ORDER — TRIAMCINOLONE ACETONIDE 0.1 % EX CREA: 1.0000 "application " | TOPICAL_CREAM | Freq: Every day | CUTANEOUS | 1 refills | Status: DC

## 2017-08-13 MED ORDER — BUPROPION HCL ER (SR) 150 MG PO TB12: ORAL_TABLET | ORAL | 5 refills | Status: DC

## 2017-08-13 NOTE — Progress Notes (Deleted)
       Patient: Lee Clark Male    DOB: 1956/04/27   62 y.o.   MRN: [id number] Visit Date: 08/13/2017  Today's Provider: Mila Merryonald Fisher, MD   No chief complaint on file.  Subjective:    HPI  Tobacco abuse From 08/07/2016-Counseled to stop smoking. He is not interested in medications.   Vitamin D deficiency From 08/07/2016-labs checked. Started Vitamin D 50,000 units every week.    Allergies  Allergen Reactions  . Ampicillin Hives  . Cephalosporins Other (See Comments)    Pt cannot remember   . Sulfa Antibiotics Diarrhea     Current Outpatient Medications:  .  cholestyramine light (PREVALITE) 4 GM/DOSE powder, Take 1 packet (4 g total) by mouth 4 (four) times daily as needed. Mixed with water, Disp: 239 g, Rfl: 5 .  hyoscyamine (LEVBID) 0.375 MG 12 hr tablet, Take 1 tablet (0.375 mg total) by mouth 2 (two) times daily., Disp: 60 tablet, Rfl: 6 .  Vitamin D, Ergocalciferol, (DRISDOL) 50000 units CAPS capsule, Take 1 capsule (50,000 Units total) by mouth every 7 (seven) days., Disp: 12 capsule, Rfl: 4  Review of Systems  Social History   Tobacco Use  . Smoking status: Current Every Day Smoker    Packs/day: 2.00  . Smokeless tobacco: Never Used  Substance Use Topics  . Alcohol use: No   Objective:   There were no vitals taken for this visit. There were no vitals filed for this visit.   Physical Exam      Assessment & Plan:           Mila Merryonald Fisher, MD  [facility]

## 2017-08-13 NOTE — Patient Instructions (Addendum)
   Please contact your eyecare professional to schedule a routine eye exam  

## 2017-08-13 NOTE — Progress Notes (Signed)
Patient: Lee Clark, Male    DOB: 02/26/1956, 62 y.o.   MRN: [id number] Visit Date: 08/13/2017  Today's Provider: [name], MD   Chief Complaint  Patient presents with  . Medicare Wellness   Subjective:    Annual wellness visit Lee Clark is a 62 y.o. male. He feels fairly well. He reports exercising none. He reports he is sleeping well.  -----------------------------------------------------------  Tobacco abuse From 08/07/2016-Counseled to stop smoking. Has cut back to 1 ppd, previously smoked up to 2 ppd since he was a teenager. He states Chantix did not help stop smoking.   Vitamin D deficiency From 08/07/2016-labs checked. Started Vitamin D 50,000 units every week.  Lab Results  Component Value Date   VD25OH 9.0 (L) 08/07/2016      Review of Systems  Constitutional: Negative.   HENT: Negative.   Eyes: Negative.   Respiratory: Negative.   Cardiovascular: Positive for leg swelling.  Gastrointestinal: Positive for constipation.  Endocrine: Positive for polyuria.  Genitourinary: Positive for enuresis and urgency.  Musculoskeletal: Positive for neck pain and neck stiffness.  Skin: Negative.   Allergic/Immunologic: Negative.   Neurological: Positive for weakness and numbness.  Hematological: Bruises/bleeds easily.  Psychiatric/Behavioral: Negative.     Social History   Socioeconomic History  . Marital status: Divorced    Spouse name: Not on file  . Number of children: Not on file  . Years of education: Not on file  . Highest education level: Not on file  Occupational History  . Not on file  Social Needs  . Financial resource strain: Not on file  . Food insecurity:    Worry: Not on file    Inability: Not on file  . Transportation needs:    Medical: Not on file    Non-medical: Not on file  Tobacco Use  . Smoking status: Current Every Day Smoker    Packs/day: 2.00  . Smokeless tobacco: Never Used  Substance and Sexual Activity  .  Alcohol use: No  . Drug use: Yes    Types: Marijuana  . Sexual activity: Not on file    Comment: occasionally  Lifestyle  . Physical activity:    Days per week: Not on file    Minutes per session: Not on file  . Stress: Not on file  Relationships  . Social connections:    Talks on phone: Not on file    Gets together: Not on file    Attends religious service: Not on file    Active member of club or organization: Not on file    Attends meetings of clubs or organizations: Not on file    Relationship status: Not on file  . Intimate partner violence:    Fear of current or ex partner: Not on file    Emotionally abused: Not on file    Physically abused: Not on file    Forced sexual activity: Not on file  Other Topics Concern  . Not on file  Social History Narrative  . Not on file    Past Medical History:  Diagnosis Date  . History of chicken pox   . Paraplegia following spinal cord injury (HCC)    partial with minimal movement, sensation from injury at L1 level  . Shingles   . Stroke (HCC)    1970's  . Tibial plateau fracture, left 11/26/2014     Patient Active Problem List   Diagnosis Date Noted  . Chronic diarrhea 12/28/2016  .  Tobacco abuse 08/02/2015  . Colon polyp 07/14/2015  . Clinical depression 07/14/2015  . Blood glucose elevated 07/14/2015  . Esophageal reflux 07/14/2015  . Irritable bowel syndrome 07/14/2015  . Vitamin D deficiency 07/14/2015  . Decubitus ulcer 11/26/2014    Past Surgical History:  Procedure Laterality Date  . BACK SURGERY     gangrene on bilateral buttoks  . BUTTOCK MASS EXCISION     bilateral, due to pressure sore that became gangrene  . CHOLECYSTECTOMY      His family history includes Cancer in his mother; Diabetes in his mother; Hypertension in his father; Lupus in his sister; Stroke in his father.      Current Outpatient Medications:  .  cholestyramine light (PREVALITE) 4 GM/DOSE powder, Take 1 packet (4 g total) by mouth 4  (four) times daily as needed. Mixed with water, Disp: 239 g, Rfl: 5 .  hyoscyamine (LEVBID) 0.375 MG 12 hr tablet, Take 1 tablet (0.375 mg total) by mouth 2 (two) times daily., Disp: 60 tablet, Rfl: 6 .  Probiotic Product (PROBIOTIC PO), Take by mouth daily., Disp: , Rfl:  .  Vitamin D, Ergocalciferol, (DRISDOL) 50000 units CAPS capsule, Take 1 capsule (50,000 Units total) by mouth every 7 (seven) days. (Patient not taking: Reported on 08/13/2017), Disp: 12 capsule, Rfl: 4  Patient Care Team: Malva Limes, MD as PCP - General (Family Medicine) Orson Ape, MD (Urology)     Objective:   Vitals: BP (!) 150/70 (BP Location: Right Arm, Patient Position: Sitting, Cuff Size: Normal)   Pulse 83   Temp 98.9 F (37.2 C) (Oral)   Resp 16   SpO2 95%   Physical Exam   General Appearance:    Alert, cooperative, no distress  Eyes:    PERRL, conjunctiva/corneas clear, EOM's intact       Lungs:     Clear to auscultation bilaterally, respirations unlabored  Heart:    Regular rate and rhythm  Neurologic:   Awake, alert, oriented x 3. No apparent focal neurological           defect.   MS:   In wheelchair, No controlled movement of lower extremities.     Activities of Daily Living No flowsheet data found.  Fall Risk Assessment Fall Risk  08/07/2016 08/02/2015  Falls in the past year? No No     Depression Screen PHQ 2/9 Scores 08/13/2017 08/07/2016 08/02/2015  PHQ - 2 Score 0 0 0  PHQ- 9 Score 0 0 1   Audit-C Alcohol Use Screening  Question Answer Points  How often do you have alcoholic drink? never 0  On days you do drink alcohol, how many drinks do you typically consume? 0 0  How oftey will you drink 6 or more in a total? never 0  Total Score:  0   A score of 3 or more in women, and 4 or more in men indicates increased risk for alcohol abuse, EXCEPT if all of the points are from question 1.      Assessment & Plan:     Annual Wellness Visit  Reviewed patient's Family  Medical History Reviewed and updated list of patient's medical providers Assessment of cognitive impairment was done Assessed patient's functional ability Established a written schedule for health screening services Health Risk Assessent Completed and Reviewed  Exercise Activities and Dietary recommendations Goals    None       There is no immunization history on file for this patient.  Health Maintenance  Topic Date Due  . Hepatitis C Screening  1955/07/05  . HIV Screening  12/16/1971  . TETANUS/TDAP  12/16/1975  . COLONOSCOPY  12/16/2006  . INFLUENZA VACCINE  12/20/2016     Discussed health benefits of physical activity, and encouraged him to engage in regular exercise appropriate for his age and condition.    -------------------------------------------------------------------------- 1. Medicare annual wellness visit, subsequent Counseled on recommendations on benefits of lung cancer screening with LDCT and colon cancer screening with colonoscopy or Cologuard. He states he he doesn't want to know if he has either cancer and would not go through colonoscopy even if Cologuard were positive. Counseled regarding Shingrix vaccine and to check with pharmacy regarding insurance coverage.   Recommended routine exam with Dr. Larence PenningNice.   2. Vitamin D deficiency  - VITAMIN D 25 Hydroxy (Vit-D Deficiency, Fractures)  3. Smoking greater than 30 pack years Refuses LDCT but will to try smoking cessation medication. Failed Chantix.  - buPROPion (WELLBUTRIN SR) 150 MG 12 hr tablet; 1 tablet daily for 3 days, then 1 tablet twice daily. Stop smoking 14 days after starting medication  Dispense: 60 tablet; Refill: 5  4. Colon cancer screening Refused screening by any method.   5. Prostate cancer screening  - PSA  6. Encounter for special screening examination for cardiovascular disorder  - Lipid panel  7. Need for hepatitis C screening test  - Hepatitis C antibody  8.  Paraplegia  [facility](HCC) Doing well with current manual wheelchair.    Mila Merryonald Aniya Jolicoeur, MD  [facility]

## 2017-08-14 LAB — LIPID PANEL
CHOL/HDL RATIO: 2.2 ratio (ref 0.0–5.0)
Cholesterol, Total: 159 mg/dL (ref 100–199)
HDL: 73 mg/dL (ref >39)
LDL Calculated: 74 mg/dL (ref 0–99)
Triglycerides: 61 mg/dL (ref 0–149)
VLDL CHOLESTEROL CAL: 12 mg/dL (ref 5–40)

## 2017-08-14 LAB — PSA: PROSTATE SPECIFIC AG, SERUM: 1.1 ng/mL (ref 0.0–4.0)

## 2017-08-14 LAB — HEPATITIS C ANTIBODY: Hep C Virus Ab: <0.1 s/co ratio (ref 0.0–0.9)

## 2017-08-14 LAB — VITAMIN D 25 HYDROXY (VIT D DEFICIENCY, FRACTURES): VIT D 25 HYDROXY: 13 ng/mL — AB (ref 30.0–100.0)

## 2018-02-12 ENCOUNTER — Other Ambulatory Visit: Payer: Self-pay | Admitting: Family Medicine

## 2018-02-12 NOTE — Telephone Encounter (Signed)
error 

## 2018-05-09 ENCOUNTER — Other Ambulatory Visit: Payer: Self-pay | Admitting: Family Medicine

## 2018-07-11 ENCOUNTER — Telehealth: Payer: Self-pay | Admitting: Family Medicine

## 2018-07-11 NOTE — Telephone Encounter (Signed)
This encounter was created in error - please disregard.

## 2018-07-11 NOTE — Telephone Encounter (Signed)
I called the patient to schedule his AWV that's due after 08/15/2018 (possibly same day as his CPE).  There was no answer and no option to leave a message. VDM (DD)

## 2018-08-28 ENCOUNTER — Other Ambulatory Visit: Payer: Self-pay | Admitting: Family Medicine

## 2018-08-28 NOTE — Telephone Encounter (Signed)
Please advise. His last office visit was 08/13/2017. Last Lipids 08/13/2017.

## 2018-09-16 ENCOUNTER — Encounter: Payer: Self-pay | Admitting: Family Medicine

## 2018-09-16 ENCOUNTER — Other Ambulatory Visit: Payer: Self-pay

## 2018-09-16 ENCOUNTER — Ambulatory Visit (INDEPENDENT_AMBULATORY_CARE_PROVIDER_SITE_OTHER): Payer: Medicare Other

## 2018-09-16 DIAGNOSIS — Z1211 Encounter for screening for malignant neoplasm of colon: Secondary | ICD-10-CM

## 2018-09-16 DIAGNOSIS — Z0000 Encounter for general adult medical examination without abnormal findings: Secondary | ICD-10-CM | POA: Diagnosis not present

## 2018-09-16 NOTE — Progress Notes (Signed)
Subjective:   Lee Clark is a 63 y.o. male who presents for Medicare Annual/Subsequent preventive examination.    This visit is being conducted through telemedicine due to the COVID-19 pandemic. This patient has given me verbal consent via doximity to conduct this visit, patient states they are participating from their home address. Some vital signs may be absent or patient reported.    Patient identification: identified by name, DOB, and current address  Review of Systems:  N/A  Cardiac Risk Factors include: advanced age (>25men, >49 women);smoking/ tobacco exposure;male gender     Objective:    Vitals: There were no vitals taken for this visit.  There is no height or weight on file to calculate BMI. Unable to obtain vitals due to visit being conducted via virtually.   Advanced Directives 09/16/2018 08/07/2016 11/25/2014 10/29/2014  Does Patient Have a Medical Advance Directive? No No No No  Would patient like information on creating a medical advance directive? No - Guardian declined - Yes - Educational materials given No - patient declined information    Tobacco Social History   Tobacco Use  Smoking Status Current Every Day Smoker  . Packs/day: 1.00  . Types: Cigarettes  Smokeless Tobacco Never Used  Tobacco Comment   started smoking age  54 yo 1-2 ppd     Ready to quit: Not Answered Counseling given: Not Answered Comment: started smoking age  60 yo 1-2 ppd   Clinical Intake:  Pre-visit preparation completed: Yes  Pain : 0-10 Pain Score: 6  Pain Type: Chronic pain Pain Location: Leg Pain Orientation: Right, Left Pain Descriptors / Indicators: Radiating, Sharp, Burning Pain Frequency: Constant     Nutritional Status: BMI <19  Underweight Nutritional Risks: None Diabetes: No  How often do you need to have someone help you when you read instructions, pamphlets, or other written materials from your doctor or pharmacy?: 1 - Never  Interpreter Needed?: No   Information entered by :: [facility], LPN  Past Medical History:  Diagnosis Date  . Colon polyp 07/14/2015  . History of chicken pox   . Paraplegia following spinal cord injury (HCC)    partial with minimal movement, sensation from injury at L1 level  . Shingles   . Stroke (HCC)    1970's  . Tibial plateau fracture, left 11/26/2014   Past Surgical History:  Procedure Laterality Date  . BACK SURGERY     gangrene on bilateral buttoks  . BUTTOCK MASS EXCISION     bilateral, due to pressure sore that became gangrene  . CHOLECYSTECTOMY     Family History  Problem Relation Age of Onset  . Cancer Mother   . Diabetes Mother   . Hypertension Father   . Stroke Father   . Lupus Sister    Social History   Socioeconomic History  . Marital status: Divorced    Spouse name: Not on file  . Number of children: 5  . Years of education: Not on file  . Highest education level: 9th grade  Occupational History  . Not on file  Social Needs  . Financial resource strain: Somewhat hard  . Food insecurity:    Worry: Never true    Inability: Never true  . Transportation needs:    Medical: No    Non-medical: No  Tobacco Use  . Smoking status: Current Every Day Smoker    Packs/day: 1.00    Types: Cigarettes  . Smokeless tobacco: Never Used  . Tobacco comment: started smoking  age  63 yo 1-2 ppd  Substance and Sexual Activity  . Alcohol use: No  . Drug use: Yes    Types: Marijuana  . Sexual activity: Not on file    Comment: occasionally  Lifestyle  . Physical activity:    Days per week: 0 days    Minutes per session: 0 min  . Stress: Not at all  Relationships  . Social connections:    Talks on phone: Patient refused    Gets together: Patient refused    Attends religious service: Patient refused    Active member of club or organization: Patient refused    Attends meetings of clubs or organizations: Patient refused    Relationship status: Patient refused  Other Topics Concern  .  Not on file  Social History Narrative  . Not on file    Outpatient Encounter Medications as of 09/16/2018  Medication Sig  . hyoscyamine (LEVBID) 0.375 MG 12 hr tablet Take 1 tablet (0.375 mg total) by mouth 2 (two) times daily.  Marland Kitchen. PREVALITE 4 GM/DOSE powder Take (4 g total) by mouth 4 (four) times daily as needed. Mixed with water  . Probiotic Product (PROBIOTIC PO) Take by mouth daily. Takes occasionally  . triamcinolone cream (KENALOG) 0.1 % Apply 1 application topically daily. (Patient taking differently: Apply 1 application topically daily. As needed)  . buPROPion (WELLBUTRIN SR) 150 MG 12 hr tablet 1 tablet daily for 3 days, then 1 tablet twice daily. Stop smoking 14 days after starting medication (Patient not taking: Reported on 09/16/2018)  . Vitamin D, Ergocalciferol, (DRISDOL) 50000 units CAPS capsule Take 1 capsule (50,000 Units total) by mouth every 7 (seven) days. (Patient not taking: Reported on 08/13/2017)   No facility-administered encounter medications on file as of 09/16/2018.     Activities of Daily Living In your present state of health, do you have any difficulty performing the following activities: 09/16/2018  Hearing? N  Vision? N  Comment Wears eye glasses.   Difficulty concentrating or making decisions? N  Walking or climbing stairs? Y  Comment Due to leg pains.   Dressing or bathing? N  Doing errands, shopping? Y  Comment Does not drive.   Preparing Food and eating ? N  Using the Toilet? N  In the past six months, have you accidently leaked urine? Y  Comment Incontinent due to muscles.   Do you have problems with loss of bowel control? N  Managing your Medications? N  Managing your Finances? N  Housekeeping or managing your Housekeeping? N  Some recent data might be hidden    Patient Care Team: Malva LimesFisher, Donald E, MD as PCP - General (Family Medicine)   Assessment:   This is a routine wellness examination [facility].  Exercise Activities and Dietary  recommendations Current Exercise Habits: The patient does not participate in regular exercise at present, Exercise limited by: orthopedic condition(s)  Goals    . DIET - DECREASE SODA OR JUICE INTAKE     Recommend to decrease soda intake to 1 a day and increase water intake to 6-8 8 oz glasses a day.        Fall Risk: Fall Risk  09/16/2018 08/07/2016 08/02/2015  Falls in the past year? 0 No No    FALL RISK PREVENTION PERTAINING TO THE HOME:  Any stairs in or around the home? Yes  If so, are there any without handrails? Yes   Home free of loose throw rugs in walkways, pet beds, electrical cords, etc?  Yes  Adequate lighting in your home to reduce risk of falls? Yes   ASSISTIVE DEVICES UTILIZED TO PREVENT FALLS:  Life alert? No  Use of a cane, walker or w/c? Yes  Grab bars in the bathroom? No   Shower chair or bench in shower? No  Elevated toilet seat or a handicapped toilet? No   TIMED UP AND GO:  Was the test performed? No .    Depression Screen PHQ 2/9 Scores 09/16/2018 08/13/2017 08/07/2016 08/02/2015  PHQ - 2 Score 0 0 0 0  PHQ- 9 Score - 0 0 1    Cognitive Function     6CIT Screen 09/16/2018  What Year? 0 points  What month? 0 points  What time? 0 points  Count back from 20 0 points  Months in reverse 2 points  Repeat phrase 0 points  Total Score 2     There is no immunization history on file for this patient.  Qualifies for Shingles Vaccine? Yes . Due for Shingrix. Education has been provided regarding the importance of this vaccine. Pt has been advised to call insurance company to determine out of pocket expense. Advised may also receive vaccine at local pharmacy or Health Dept. Verbalized acceptance and understanding.  Tdap: Although this vaccine is not a covered service during a Wellness Exam, does the patient still wish to receive this vaccine today?  No .  Advised may receive this vaccine at local pharmacy or Health Dept. Aware to provide a copy of the  vaccination record if obtained from local pharmacy or Health Dept. Verbalized acceptance and understanding.   Screening Tests Health Maintenance  Topic Date Due  . HIV Screening  12/16/1971  . TETANUS/TDAP  12/16/1975  . COLONOSCOPY  01/09/2008  . INFLUENZA VACCINE  12/21/2018  . Hepatitis C Screening  Completed   Cancer Screenings:  Colorectal Screening: Completed 01/08/1998. Repeat every 10 years. Pt declined referral today. Cologuard order placed today.   Lung Cancer Screening: (Low Dose CT Chest recommended if Age 44-80 years, 30 pack-year currently smoking OR have quit w/in 15years.) does qualify, however declines.  Additional Screening:  Hepatitis C Screening: Up to date Vision Screening: Recommended annual ophthalmology exams for early detection of glaucoma and other disorders of the eye.  Dental Screening: Recommended annual dental exams for proper oral hygiene  Community Resource Referral:  CRR required this visit?  No        Plan:  I have personally reviewed and addressed the Medicare Annual Wellness questionnaire and have noted the following in the patient's chart:  A. Medical and social history B. Use of alcohol, tobacco or illicit drugs  C. Current medications and supplements D. Functional ability and status E.  Nutritional status F.  Physical activity G. Advance directives H. List of other physicians I.  Hospitalizations, surgeries, and ER visits in previous 12 months J.  Vitals K. Screenings such as hearing and vision if needed, cognitive and depression L. Referrals and appointments   In addition, I have reviewed and discussed with patient certain preventive protocols, quality metrics, and best practice recommendations. A written personalized care plan for preventive services as well as general preventive health recommendations were provided to patient.   Darrick Huntsman, [place]  1/61/0960 Nurse Health Advisor   Nurse Notes: Cologuard  order sent today. Pt declined future order for an HIV lab test and the tetanus vaccine.

## 2018-09-16 NOTE — Patient Instructions (Addendum)
[name] , Thank you for taking time to come for your Medicare Wellness Visit. I appreciate your ongoing commitment to your health goals. Please review the following plan we discussed and let me know if I can assist you in the future.   Screening recommendations/referrals: Colonoscopy: Cologuard order placed today.  Recommended yearly ophthalmology/optometry visit for glaucoma screening and checkup Recommended yearly dental visit for hygiene and checkup  Vaccinations: Influenza vaccine: Not required at this time.  Pneumococcal vaccine: Not required until age 62. Tdap vaccine: Pt declines today.  Shingles vaccine: Pt declines today.     Advanced directives: Advance directive discussed with you today. Even though you declined this today please call our office should you change your mind and we can give you the proper paperwork for you to fill out.  Conditions/risks identified: Recommend to decrease soda intake to 1 a day and increase water intake to 6-8 8 oz glasses a day.   Next appointment: 12/02/18 @ 2:00 PM with Dr Lee Clark.   Preventive Care 40-64 Years, Male Preventive care refers to lifestyle choices and visits with your health care provider that can promote health and wellness. What does preventive care include?  A yearly physical exam. This is also called an annual well check.  Dental exams once or twice a year.  Routine eye exams. Ask your health care provider how often you should have your eyes checked.  Personal lifestyle choices, including:  Daily care of your teeth and gums.  Regular physical activity.  Eating a healthy diet.  Avoiding tobacco and drug use.  Limiting alcohol use.  Practicing safe sex.  Taking low-dose aspirin every day starting at age 62. What happens during an annual well check? The services and screenings done by your health care provider during your annual well check will depend on your age, overall health, lifestyle risk factors, and  family history of disease. Counseling  Your health care provider may ask you questions about your:  Alcohol use.  Tobacco use.  Drug use.  Emotional well-being.  Home and relationship well-being.  Sexual activity.  Eating habits.  Work and work Astronome[employer]vironment. Screening  You may have the following tests or measurements:  Height, weight, and BMI.  Blood pressure.  Lipid and cholesterol levels. These may be checked every 5 years, or more frequently if you are over 62 years old.  Skin check.  Lung cancer screening. You may have this screening every year starting at age 62 if you have a 30-pack-year history of smoking and currently smoke or have quit within the past 15 years.  Fecal occult blood test (FOBT) of the stool. You may have this test every year starting at age 62.  Flexible sigmoidoscopy or colonoscopy. You may have a sigmoidoscopy every 5 years or a colonoscopy every 10 years starting at age 62.  Prostate cancer screening. Recommendations will vary depending on your family history and other risks.  Hepatitis C blood test.  Hepatitis B blood test.  Sexually transmitted disease (STD) testing.  Diabetes screening. This is done by checking your blood sugar (glucose) after you have not eaten for a while (fasting). You may have this done every 1-3 years. Discuss your test results, treatment options, and if necessary, the need for more tests with your health care provider. Vaccines  Your health care provider may recommend certain vaccines, such as:  Influenza vaccine. This is recommended every year.  Tetanus, diphtheria, and acellular pertussis (Tdap, Td) vaccine. You may need a Td booster every  10 years.  Zoster vaccine. You may need this after age 39.  Pneumococcal 13-valent conjugate (PCV13) vaccine. You may need this if you have certain conditions and have not been vaccinated.  Pneumococcal polysaccharide (PPSV23) vaccine. You may need one or two doses if you  smoke cigarettes or if you have certain conditions. Talk to your health care provider about which screenings and vaccines you need and how often you need them. This information is not intended to replace advice given to you by your health care provider. Make sure you discuss any questions you have with your health care provider. Document Released: 06/04/2015 Document Revised: 01/26/2016 Document Reviewed: 03/09/2015 Elsevier Interactive Patient Education  2017 [employer].  Fall Prevention in the Home Falls can cause injuries. They can happen to people of all ages. There are many things you can do to make your home safe and to help prevent falls. What can I do on the outside of my home?  Regularly fix the edges of walkways and driveways and fix any cracks.  Remove anything that might make you trip as you walk through a door, such as a raised step or threshold.  Trim any bushes or trees on the path to your home.  Use bright outdoor lighting.  Clear any walking paths of anything that might make someone trip, such as rocks or tools.  Regularly check to see if handrails are loose or broken. Make sure that both sides of any steps have handrails.  Any raised decks and porches should have guardrails on the edges.  Have any leaves, snow, or ice cleared regularly.  Use sand or salt on walking paths during winter.  Clean up any spills in your garage right away. This includes oil or grease spills. What can I do in the bathroom?  Use night lights.  Install grab bars by the toilet and in the tub and shower. Do not use towel bars as grab bars.  Use non-skid mats or decals in the tub or shower.  If you need to sit down in the shower, use a plastic, non-slip stool.  Keep the floor dry. Clean up any water that spills on the floor as soon as it happens.  Remove soap buildup in the tub or shower regularly.  Attach bath mats securely with double-sided non-slip rug tape.  Do not have throw  rugs and other things on the floor that can make you trip. What can I do in the bedroom?  Use night lights.  Make sure that you have a light by your bed that is easy to reach.  Do not use any sheets or blankets that are too big for your bed. They should not hang down onto the floor.  Have a firm chair that has side arms. You can use this for support while you get dressed.  Do not have throw rugs and other things on the floor that can make you trip. What can I do in the kitchen?  Clean up any spills right away.  Avoid walking on wet floors.  Keep items that you use a lot in easy-to-reach places.  If you need to reach something above you, use a strong step stool that has a grab bar.  Keep electrical cords out of the way.  Do not use floor polish or wax that makes floors slippery. If you must use wax, use non-skid floor wax.  Do not have throw rugs and other things on the floor that can make you trip. What can  I do with my stairs?  Do not leave any items on the stairs.  Make sure that there are handrails on both sides of the stairs and use them. Fix handrails that are broken or loose. Make sure that handrails are as long as the stairways.  Check any carpeting to make sure that it is firmly attached to the stairs. Fix any carpet that is loose or worn.  Avoid having throw rugs at the top or bottom of the stairs. If you do have throw rugs, attach them to the floor with carpet tape.  Make sure that you have a light switch at the top of the stairs and the bottom of the stairs. If you do not have them, ask someone to add them for you. What else can I do to help prevent falls?  Wear shoes that:  Do not have high heels.  Have rubber bottoms.  Are comfortable and fit you well.  Are closed at the toe. Do not wear sandals.  If you use a stepladder:  Make sure that it is fully opened. Do not climb a closed stepladder.  Make sure that both sides of the stepladder are locked into  place.  Ask someone to hold it for you, if possible.  Clearly mark and make sure that you can see:  Any grab bars or handrails.  First and last steps.  Where the edge of each step is.  Use tools that help you move around (mobility aids) if they are needed. These include:  Canes.  Walkers.  Scooters.  Crutches.  Turn on the lights when you go into a dark area. Replace any light bulbs as soon as they burn out.  Set up your furniture so you have a clear path. Avoid moving your furniture around.  If any of your floors are uneven, fix them.  If there are any pets around you, be aware of where they are.  Review your medicines with your doctor. Some medicines can make you feel dizzy. This can increase your chance of falling. Ask your doctor what other things that you can do to help prevent falls. This information is not intended to replace advice given to you by your health care provider. Make sure you discuss any questions you have with your health care provider. Document Released: 03/04/2009 Document Revised: 10/14/2015 Document Reviewed: 06/12/2014 Elsevier Interactive Patient Education  2017 [employer].

## 2018-12-02 ENCOUNTER — Encounter: Payer: Self-pay | Admitting: Family Medicine

## 2018-12-02 ENCOUNTER — Other Ambulatory Visit: Payer: Self-pay

## 2018-12-02 ENCOUNTER — Ambulatory Visit (INDEPENDENT_AMBULATORY_CARE_PROVIDER_SITE_OTHER): Payer: Medicare Other | Admitting: Family Medicine

## 2018-12-02 VITALS — BP 126/78 | HR 85 | Temp 98.2°F | Ht 74.0 in | Wt 135.0 lb

## 2018-12-02 DIAGNOSIS — K580 Irritable bowel syndrome with diarrhea: Secondary | ICD-10-CM

## 2018-12-02 DIAGNOSIS — K529 Noninfective gastroenteritis and colitis, unspecified: Secondary | ICD-10-CM

## 2018-12-02 DIAGNOSIS — G8220 Paraplegia, unspecified: Secondary | ICD-10-CM

## 2018-12-02 DIAGNOSIS — F17210 Nicotine dependence, cigarettes, uncomplicated: Secondary | ICD-10-CM | POA: Diagnosis not present

## 2018-12-02 DIAGNOSIS — Z125 Encounter for screening for malignant neoplasm of prostate: Secondary | ICD-10-CM

## 2018-12-02 DIAGNOSIS — E559 Vitamin D deficiency, unspecified: Secondary | ICD-10-CM | POA: Diagnosis not present

## 2018-12-02 MED ORDER — HYOSCYAMINE SULFATE ER 0.375 MG PO TB12: 0.3750 mg | ORAL_TABLET | Freq: Two times a day (BID) | ORAL | 5 refills | Status: DC

## 2018-12-02 MED ORDER — TRIAMCINOLONE ACETONIDE 0.1 % EX CREA: 1.0000 "application " | TOPICAL_CREAM | Freq: Every day | CUTANEOUS | 1 refills | Status: DC

## 2018-12-02 MED ORDER — CHOLESTYRAMINE LIGHT 4 GM/DOSE PO POWD: ORAL | 3 refills | Status: DC

## 2018-12-02 NOTE — Progress Notes (Signed)
[facility]    DOB: 1955-12-20   63 y.o.   MRN: [id number] Visit Date: 12/02/2018  Today's Provider: Mila Merryonald Fisher, MD    Subjective:     HPI Here today to follow up on chronic post chole diarrhea and vitamin d deficiency. He stopped vitamin d many months ago due to upsetting stomach. States that cholestyramine is working well to control diarrhea. No new complaints today. Does admit to continue smoking cigarettes and not interested in medications to help stop.  Allergies  Allergen Reactions  . Ampicillin Hives  . Cephalosporins Other (See Comments)    Pt cannot remember   . Sulfa Antibiotics Diarrhea     Current Outpatient Medications:  .  cholestyramine light (PREVALITE) 4 GM/DOSE powder, Take (4 g total) by mouth 4 (four) times daily as needed. Mixed with water, Disp: 462 g, Rfl: 3 .  Probiotic Product (PROBIOTIC PO), Take by mouth daily. Takes occasionally, Disp: , Rfl:  .  triamcinolone cream (KENALOG) 0.1 %, Apply 1 application topically daily., Disp: 80 g, Rfl: 1 .  hyoscyamine (LEVBID) 0.375 MG 12 hr tablet, Take 1 tablet (0.375 mg total) by mouth 2 (two) times daily., Disp: 60 tablet, Rfl: 5  Review of Systems  Constitutional: Negative for appetite change, chills and fever.  Respiratory: Negative for chest tightness, shortness of breath and wheezing.   Cardiovascular: Negative for chest pain and palpitations.  Gastrointestinal: Negative for abdominal pain, nausea and vomiting.    Social History   Tobacco Use  . Smoking status: Current Every Day Smoker    Packs/day: 1.00    Types: Cigarettes  . Smokeless tobacco: Never Used  . Tobacco comment: started smoking age  63 yo 1-2 ppd  Substance Use Topics  . Alcohol use: No      Objective:   BP 126/78 (BP Location: Right Arm, Patient Position: Sitting, Cuff Size: Normal)   Pulse 85   Temp 98.2 F (36.8 C) (Oral)   Ht 6\' 2"  (1.88 m) Comment: Pt reported  Wt 135 lb (61.2 kg) Comment: Pt  reported  BMI 17.33 kg/m  Vitals:   12/02/18 1421  BP: 126/78  Pulse: 85  Temp: 98.2 F (36.8 C)  TempSrc: Oral  Weight: 135 lb (61.2 kg)  Height: 6\' 2"  (1.88 m)     Physical Exam    General Appearance:    Alert, cooperative, no distress  Eyes:    PERRL, conjunctiva/corneas clear, EOM's intact       Lungs:     Clear to auscultation bilaterally, respirations unlabored  Heart:    Regular rate and rhythm  Neurologic:   Awake, alert, oriented x 3. No apparent focal neurological           defect.           Assessment & Plan    1. Prostate cancer screening  - PSA  2. Paraplegia (HCC)   3. Vitamin D deficiency Currently off of supplements.  - VITAMIN D 25 Hydroxy (Vit-D Deficiency, Fractures)  4. Smoking greater than 30 pack years Encouraged smoking cessation. He refused referral for lung ca screening. Refused medications to aid smoking cessation.   5. Chronic diarrhea Well controlled.  - cholestyramine light (PREVALITE) 4 GM/DOSE powder; Take (4 g total) by mouth 4 (four) times daily as needed. Mixed with water  Dispense: 462 g; Refill: 3  6. Irritable bowel syndrome with diarrhea Well controlled.  - cholestyramine light (PREVALITE) 4 GM/DOSE  powder; Take (4 g total) by mouth 4 (four) times daily as needed. Mixed with water  Dispense: 462 g; Refill: 3 - hyoscyamine (LEVBID) 0.375 MG 12 hr tablet; Take 1 tablet (0.375 mg total) by mouth 2 (two) times daily.  Dispense: 60 tablet; Refill: 5  refilll - triamcinolone cream (KENALOG) 0.1 %; Apply 1 application topically daily.  Dispense: 80 g; Refill: 1     Lelon Huh, MD  [facility]

## 2018-12-02 NOTE — Patient Instructions (Addendum)
.   We will have flu vaccines available after Labor Day. Please go to your pharmacy or call the office in early September to schedule you flu shot.   Screening for lung cancer is recommended for people between 24 and 61 years of age who have smoked the equivalent of 1 pack per day for 30 years. Please call our office at [phone] to schedule a low dose CT lung scan for lung cancer screening.    Zyban works well to help stop smoking. Give my office a call if you decide to try this medication.    You are over due for colon cancer screening. I recommend the Cologuard test. Call my office if you decide to have this test donw.

## 2018-12-03 ENCOUNTER — Telehealth: Payer: Self-pay

## 2018-12-03 LAB — PSA: Prostate Specific Ag, Serum: 0.9 ng/mL (ref 0.0–4.0)

## 2018-12-03 LAB — VITAMIN D 25 HYDROXY (VIT D DEFICIENCY, FRACTURES): Vit D, 25-Hydroxy: 12.2 ng/mL — ABNORMAL LOW (ref 30.0–100.0)

## 2018-12-03 NOTE — Telephone Encounter (Signed)
Left message for patient to call regarding lab results.  

## 2018-12-03 NOTE — Telephone Encounter (Signed)
-----   Message from Birdie Sons, MD sent at 12/03/2018  9:07 AM EDT ----- psa is normal. Vitamin d levels are very low. Need to take OTC vitamin D3 1000 units daily and check yearly.

## 2019-04-29 ENCOUNTER — Other Ambulatory Visit: Payer: Self-pay | Admitting: Family Medicine

## 2019-04-29 DIAGNOSIS — K529 Noninfective gastroenteritis and colitis, unspecified: Secondary | ICD-10-CM

## 2019-04-29 DIAGNOSIS — K580 Irritable bowel syndrome with diarrhea: Secondary | ICD-10-CM

## 2019-04-29 NOTE — Telephone Encounter (Signed)
Requested Prescriptions  Pending Prescriptions Disp Refills  . cholestyramine light (PREVALITE) 4 GM/DOSE powder [Pharmacy Med Name: PREVALITE POWDER] 462 g 0    Sig: Take (4 g total) by mouth 4 (four) times daily as needed. Mixed with water     Cardiovascular:  Antilipid - Bile Acid Sequestrants Failed - 04/29/2019 10:28 AM      Failed - Total Cholesterol in normal range and within 360 days    Cholesterol, Total  Date Value Ref Range Status  08/13/2017 159 100 - 199 mg/dL Final         Failed - LDL in normal range and within 360 days    LDL Calculated  Date Value Ref Range Status  08/13/2017 74 0 - 99 mg/dL Final         Failed - HDL in normal range and within 360 days    HDL  Date Value Ref Range Status  08/13/2017 73 >39 mg/dL Final         Failed - Triglycerides in normal range and within 360 days    Triglycerides  Date Value Ref Range Status  08/13/2017 61 0 - 149 mg/dL Final         Passed - Valid encounter within last 12 months    Recent Outpatient Visits          4 months ago Prostate cancer screening   [facility] Birdie Sons, MD   1 year ago Medicare annual wellness visit, subsequent   [facility] Birdie Sons, MD   2 years ago Medicare annual wellness visit, subsequent   [facility] Birdie Sons, MD   3 years ago Medicare annual wellness visit, subsequent   [facility] Birdie Sons, MD      Future Appointments            In 4 months [facility], [place]

## 2019-05-26 ENCOUNTER — Other Ambulatory Visit: Payer: Self-pay | Admitting: Family Medicine

## 2019-05-26 DIAGNOSIS — K529 Noninfective gastroenteritis and colitis, unspecified: Secondary | ICD-10-CM

## 2019-05-26 DIAGNOSIS — K580 Irritable bowel syndrome with diarrhea: Secondary | ICD-10-CM

## 2019-06-23 ENCOUNTER — Other Ambulatory Visit: Payer: Self-pay | Admitting: Family Medicine

## 2019-06-23 DIAGNOSIS — K529 Noninfective gastroenteritis and colitis, unspecified: Secondary | ICD-10-CM

## 2019-06-23 DIAGNOSIS — K580 Irritable bowel syndrome with diarrhea: Secondary | ICD-10-CM

## 2019-06-23 NOTE — Telephone Encounter (Signed)
Requested medication (s) are due for refill today- yes  Requested medication (s) are on the active medication list -yes  Future visit scheduled -yes  Last refill: 05/26/19  Notes to clinic: patient labs fail protocol- sent for lab notes and refill review.  Requested Prescriptions  Pending Prescriptions Disp Refills   PREVALITE 4 GM/DOSE powder [Pharmacy Med Name: PREVALITE POWDER] 462 g 0    Sig: Take (4 g total) by mouth 4 (four) times daily as needed. Mixed with water      Cardiovascular:  Antilipid - Bile Acid Sequestrants Failed - 06/23/2019  8:36 AM      Failed - Total Cholesterol in normal range and within 360 days    Cholesterol, Total  Date Value Ref Range Status  08/13/2017 159 100 - 199 mg/dL Final          Failed - LDL in normal range and within 360 days    LDL Calculated  Date Value Ref Range Status  08/13/2017 74 0 - 99 mg/dL Final          Failed - HDL in normal range and within 360 days    HDL  Date Value Ref Range Status  08/13/2017 73 >39 mg/dL Final          Failed - Triglycerides in normal range and within 360 days    Triglycerides  Date Value Ref Range Status  08/13/2017 61 0 - 149 mg/dL Final          Passed - Valid encounter within last 12 months    Recent Outpatient Visits           6 months ago Prostate cancer screening   [facility] Malva Limes, MD   1 year ago Medicare annual wellness visit, subsequent   [facility] Malva Limes, MD   2 years ago Medicare annual wellness visit, subsequent   [facility] Malva Limes, MD   3 years ago Medicare annual wellness visit, subsequent   [facility] Malva Limes, MD       Future Appointments             In 3 months [facility]                 Requested Prescriptions  Pending Prescriptions Disp Refills   PREVALITE 4 GM/DOSE powder [Pharmacy Med Name: PREVALITE POWDER] 462 g 0    Sig:  Take (4 g total) by mouth 4 (four) times daily as needed. Mixed with water      Cardiovascular:  Antilipid - Bile Acid Sequestrants Failed - 06/23/2019  8:36 AM      Failed - Total Cholesterol in normal range and within 360 days    Cholesterol, Total  Date Value Ref Range Status  08/13/2017 159 100 - 199 mg/dL Final          Failed - LDL in normal range and within 360 days    LDL Calculated  Date Value Ref Range Status  08/13/2017 74 0 - 99 mg/dL Final          Failed - HDL in normal range and within 360 days    HDL  Date Value Ref Range Status  08/13/2017 73 >39 mg/dL Final          Failed - Triglycerides in normal range and within 360 days    Triglycerides  Date Value Ref Range Status  08/13/2017 61 0 - 149 mg/dL Final  Passed - Valid encounter within last 12 months    Recent Outpatient Visits           6 months ago Prostate cancer screening   [facility] Birdie Sons, MD   1 year ago Medicare annual wellness visit, subsequent   [facility] Birdie Sons, MD   2 years ago Medicare annual wellness visit, subsequent   [facility] Birdie Sons, MD   3 years ago Medicare annual wellness visit, subsequent   [place], [place]       Future Appointments             In 3 months [facility], [place]

## 2019-06-25 ENCOUNTER — Other Ambulatory Visit: Payer: Self-pay | Admitting: Family Medicine

## 2019-06-25 DIAGNOSIS — K529 Noninfective gastroenteritis and colitis, unspecified: Secondary | ICD-10-CM

## 2019-06-25 DIAGNOSIS — K580 Irritable bowel syndrome with diarrhea: Secondary | ICD-10-CM

## 2019-06-27 ENCOUNTER — Other Ambulatory Visit: Payer: Self-pay | Admitting: Family Medicine

## 2019-06-27 ENCOUNTER — Ambulatory Visit (INDEPENDENT_AMBULATORY_CARE_PROVIDER_SITE_OTHER): Payer: Medicare Other | Admitting: Physician Assistant

## 2019-06-27 DIAGNOSIS — K529 Noninfective gastroenteritis and colitis, unspecified: Secondary | ICD-10-CM

## 2019-06-27 DIAGNOSIS — K580 Irritable bowel syndrome with diarrhea: Secondary | ICD-10-CM | POA: Diagnosis not present

## 2019-06-27 DIAGNOSIS — R21 Rash and other nonspecific skin eruption: Secondary | ICD-10-CM

## 2019-06-27 MED ORDER — TRIAMCINOLONE ACETONIDE 0.1 % EX CREA: 1.0000 "application " | TOPICAL_CREAM | Freq: Every day | CUTANEOUS | 1 refills | Status: DC

## 2019-06-27 MED ORDER — CHOLESTYRAMINE LIGHT 4 GM/DOSE PO POWD: ORAL | 1 refills | Status: DC

## 2019-06-27 NOTE — Telephone Encounter (Signed)
Prescription was approved by Osvaldo Angst, PA-C

## 2019-06-27 NOTE — Telephone Encounter (Signed)
Requested medication (s) are due for refill today: Yes  Requested medication (s) are on the active medication list: Yes  Last refill:  05/26/19  Future visit scheduled: Yes  Notes to clinic:  See request.    Requested Prescriptions  Pending Prescriptions Disp Refills   PREVALITE 4 GM/DOSE powder [Pharmacy Med Name: PREVALITE POWDER] 462 g 0    Sig: Take (4 g total) by mouth 4 (four) times daily as needed. Mixed with water      Cardiovascular:  Antilipid - Bile Acid Sequestrants Failed - 06/27/2019  2:39 PM      Failed - Total Cholesterol in normal range and within 360 days    Cholesterol, Total  Date Value Ref Range Status  08/13/2017 159 100 - 199 mg/dL Final          Failed - LDL in normal range and within 360 days    LDL Calculated  Date Value Ref Range Status  08/13/2017 74 0 - 99 mg/dL Final          Failed - HDL in normal range and within 360 days    HDL  Date Value Ref Range Status  08/13/2017 73 >39 mg/dL Final          Failed - Triglycerides in normal range and within 360 days    Triglycerides  Date Value Ref Range Status  08/13/2017 61 0 - 149 mg/dL Final          Passed - Valid encounter within last 12 months    Recent Outpatient Visits           6 months ago Prostate cancer screening   [facility] Malva Limes, MD   1 year ago Medicare annual wellness visit, subsequent   [facility] Malva Limes, MD   2 years ago Medicare annual wellness visit, subsequent   [facility] Malva Limes, MD   3 years ago Medicare annual wellness visit, subsequent   [facility] Malva Limes, MD       Future Appointments             In 2 months [facility]

## 2019-06-27 NOTE — Telephone Encounter (Signed)
Copied from CRM [id number]. Topic: General - Other >> Jun 27, 2019  2:36 PM Marylen Ponto wrote: Reason for CRM: Pt stated he was advised by his pharmacy that he will need an appt to receive more refills. Pt requests that the Rx for cholestyramine light (PREVALITE) 4 GM/DOSE powder be approved asap. Pt declined to schedule an appt right now because he stated he has to get transportation. Pt requests call back

## 2019-06-27 NOTE — Progress Notes (Signed)
Patient: Lee Clark [facility] Male    DOB: [birth year]   64 y.o.   MRN: [id number] Visit Date: 06/27/2019  Today's Provider: Trey Sailors, PA-C   Chief Complaint  Patient presents with  . Medication Refill   Subjective:    Virtual Visit via Telephone Note  I connected with Lee Clark [facility] on 06/27/19 at  3:20 PM EST by telephone and verified that I am speaking with the correct person using two identifiers.  Location: Patient: Home Provider: Office    I discussed the limitations, risks, security and privacy concerns of performing an evaluation and management service by telephone and the availability of in person appointments. I also discussed with the patient that there may be a patient responsible charge related to this service. The patient expressed understanding and agreed to proceed.  HPI   Patient needing refill on triamcinolone cream and Prevalite powder.   Prevalite powder used for diarrhea and IBS. No issues with it and reports it helps him.   Triamcinelone cream for facial rash on occasion.   Allergies  Allergen Reactions  . Ampicillin Hives  . Cephalosporins Other (See Comments)    Pt cannot remember   . Sulfa Antibiotics Diarrhea     Current Outpatient Medications:  .  cholestyramine light (PREVALITE) 4 GM/DOSE powder, Take (4 g total) by mouth 4 (four) times daily as needed. Mixed with water, Disp: 462 g, Rfl: 0 .  hyoscyamine (LEVBID) 0.375 MG 12 hr tablet, Take 1 tablet (0.375 mg total) by mouth 2 (two) times daily., Disp: 60 tablet, Rfl: 5 .  Probiotic Product (PROBIOTIC PO), Take by mouth daily. Takes occasionally, Disp: , Rfl:  .  triamcinolone cream (KENALOG) 0.1 %, Apply 1 application topically daily., Disp: 80 g, Rfl: 1  Review of Systems  Social History   Tobacco Use  . Smoking status: Current Every Day Smoker    Packs/day: 1.00    Types: Cigarettes  . Smokeless tobacco: Never Used  . Tobacco comment: started smoking age  64 yo 1-2 ppd    Substance Use Topics  . Alcohol use: No      Objective:   There were no vitals taken for this visit. There were no vitals filed for this visit.There is no height or weight on file to calculate BMI.   Physical Exam   No results found for any visits on 06/27/19.     Assessment & Plan    1. Facial rash  - triamcinolone cream (KENALOG) 0.1 %; Apply 1 application topically daily.  Dispense: 80 g; Refill: 1  2. Chronic diarrhea  - cholestyramine light (PREVALITE) 4 GM/DOSE powder; Take (4 g total) by mouth 4 (four) times daily as needed. Mixed with water  Dispense: 462 g; Refill: 1  3. Irritable bowel syndrome with diarrhea  - cholestyramine light (PREVALITE) 4 GM/DOSE powder; Take (4 g total) by mouth 4 (four) times daily as needed. Mixed with water  Dispense: 462 g; Refill: 1 I discussed the assessment and treatment plan with the patient. The patient was provided an opportunity to ask questions and all were answered. The patient agreed with the plan and demonstrated an understanding of the instructions.   The patient was advised to call back or seek an in-person evaluation if the symptoms worsen or if the condition fails to improve as anticipated.  I provided 15 minutes of non-face-to-face time during this encounter.     Trey Sailors, PA-C  [facility]  [place]

## 2019-07-01 NOTE — Patient Instructions (Signed)
Diet for Irritable Bowel Syndrome  When you have irritable bowel syndrome (IBS), it is very important to eat the foods and follow the eating habits that are best for your condition. IBS may cause various symptoms such as pain in the abdomen, constipation, or diarrhea. Choosing the right foods can help to ease the discomfort from these symptoms. Work with your health care provider and diet and nutrition specialist (dietitian) to find the eating plan that will help to control your symptoms. What are tips for following this plan?      Keep a food diary. This will help you identify foods that cause symptoms. Write down: ? What you eat and when you eat it. ? What symptoms you have. ? When symptoms occur in relation to your meals, such as "pain in abdomen 2 hours after dinner."  Eat your meals slowly and in a relaxed setting.  Aim to eat 5-6 small meals per day. Do not skip meals.  Drink enough fluid to keep your urine pale yellow.  Ask your health care provider if you should take an over-the-counter probiotic to help restore healthy bacteria in your gut (digestive tract). ? Probiotics are foods that contain good bacteria and yeasts.  Your dietitian may have specific dietary recommendations for you based on your symptoms. He or she may recommend that you: ? Avoid foods that cause symptoms. Talk with your dietitian about other ways to get the same nutrients that are in those problem foods. ? Avoid foods with gluten. Gluten is a protein that is found in rye, wheat, and barley. ? Eat more foods that contain soluble fiber. Examples of foods with high soluble fiber include oats, seeds, and certain fruits and vegetables. Take a fiber supplement if directed by your dietitian. ? Reduce or avoid certain foods called FODMAPs. These are foods that contain carbohydrates that are hard to digest. Ask your doctor which foods contain these carbohydrates. What foods are not recommended? The following are some  foods and drinks that may make your symptoms worse:  Fatty foods, such as french fries.  Foods that contain gluten, such as pasta and cereal.  Dairy products, such as milk, cheese, and ice cream.  Chocolate.  Alcohol.  Products with caffeine, such as coffee.  Carbonated drinks, such as soda.  Foods that are high in FODMAPs. These include certain fruits and vegetables.  Products with sweeteners such as honey, high fructose corn syrup, sorbitol, and mannitol. The items listed above may not be a complete list of foods and beverages you should avoid. Contact a dietitian for more information. What foods are good sources of fiber? Your health care provider or dietitian may recommend that you eat more foods that contain fiber. Fiber can help to reduce constipation and other IBS symptoms. Add foods with fiber to your diet a little at a time so your body can get used to them. Too much fiber at one time might cause gas and swelling of your abdomen. The following are some foods that are good sources of fiber:  Berries, such as raspberries, strawberries, and blueberries.  Tomatoes.  Carrots.  Brown rice.  Oats.  Seeds, such as chia and pumpkin seeds. The items listed above may not be a complete list of recommended sources of fiber. Contact your dietitian for more options. Where to find more information  International Foundation for Functional Gastrointestinal Disorders: www.iffgd.org  National Institute of Diabetes and Digestive and Kidney Diseases: www.niddk.nih.gov Summary  When you have irritable bowel syndrome (IBS), it   is very important to eat the foods and follow the eating habits that are best for your condition.  IBS may cause various symptoms such as pain in the abdomen, constipation, or diarrhea.  Choosing the right foods can help to ease the discomfort that comes from symptoms.  Keep a food diary. This will help you identify foods that cause symptoms.  Your health  care provider or diet and nutrition specialist (dietitian) may recommend that you eat more foods that contain fiber. This information is not intended to replace advice given to you by your health care provider. Make sure you discuss any questions you have with your health care provider. Document Revised: 08/28/2018 Document Reviewed: 01/09/2017 Elsevier Patient Education  2020 Elsevier Inc.  

## 2019-08-12 ENCOUNTER — Other Ambulatory Visit: Payer: Self-pay | Admitting: Physician Assistant

## 2019-08-12 ENCOUNTER — Other Ambulatory Visit: Payer: Self-pay | Admitting: Family Medicine

## 2019-08-12 DIAGNOSIS — K529 Noninfective gastroenteritis and colitis, unspecified: Secondary | ICD-10-CM

## 2019-08-12 DIAGNOSIS — K580 Irritable bowel syndrome with diarrhea: Secondary | ICD-10-CM

## 2019-08-12 NOTE — Telephone Encounter (Signed)
[facility] faxed refill request for the following medications:  cholestyramine light (PREVALITE) 4 GM/DOSE powder - needing refills    Please advise.  Thanks, [employer]

## 2019-08-12 NOTE — Telephone Encounter (Signed)
Please advise if refill is appropriate 

## 2019-08-13 MED ORDER — CHOLESTYRAMINE LIGHT 4 GM/DOSE PO POWD: ORAL | 1 refills | Status: DC

## 2019-09-17 NOTE — Progress Notes (Signed)
Subjective:   Lee Clark is a 64 y.o. male who presents for Medicare Annual/Subsequent preventive examination.    This visit is being conducted through telemedicine due to the COVID-19 pandemic. This patient has given me verbal consent via doximity to conduct this visit, patient states they are participating from their home address. Some vital signs may be absent or patient reported.    Patient identification: identified by name, DOB, and current address  Review of Systems:  N/A        Objective:    Vitals: There were no vitals taken for this visit.  There is no height or weight on file to calculate BMI. Unable to obtain vitals due to visit being conducted via telephonically.   Advanced Directives 09/22/2019 09/16/2018 08/07/2016 11/25/2014 10/29/2014  Does Patient Have a Medical Advance Directive? No No No No No  Would patient like information on creating a medical advance directive? No - Patient declined No - Guardian declined - Yes - Educational materials given No - patient declined information    Tobacco Social History   Tobacco Use  Smoking Status Current Every Day Smoker  . Packs/day: 1.00  . Types: Cigarettes  Smokeless Tobacco Never Used  Tobacco Comment   started smoking age  67 yo 1-2 ppd     Ready to quit: No Counseling given: No Comment: started smoking age  39 yo 1-2 ppd   Clinical Intake:  Pre-visit preparation completed: Yes  Pain : 0-10 Pain Score: 9  Pain Type: Chronic pain Pain Location: (from waist down) Pain Orientation: Lower Pain Descriptors / Indicators: Aching Pain Frequency: Constant     Nutritional Risks: None Diabetes: No  How often do you need to have someone help you when you read instructions, pamphlets, or other written materials from your doctor or pharmacy?: 1 - Never  Interpreter Needed?: No  Information entered by :: [facility], LPN  Past Medical History:  Diagnosis Date  . Colon polyp 07/14/2015  . History of chicken  pox   . Paraplegia following spinal cord injury (HCC)    partial with minimal movement, sensation from injury at L1 level  . Shingles   . Stroke (HCC)    1970's  . Tibial plateau fracture, left 11/26/2014   Past Surgical History:  Procedure Laterality Date  . BACK SURGERY     gangrene on bilateral buttoks  . BUTTOCK MASS EXCISION     bilateral, due to pressure sore that became gangrene  . CHOLECYSTECTOMY     Family History  Problem Relation Age of Onset  . Cancer Mother   . Diabetes Mother   . Hypertension Father   . Stroke Father   . Lupus Sister    Social History   Socioeconomic History  . Marital status: Divorced    Spouse name: Not on file  . Number of children: 5  . Years of education: Not on file  . Highest education level: 9th grade  Occupational History  . Not on file  Tobacco Use  . Smoking status: Current Every Day Smoker    Packs/day: 1.00    Types: Cigarettes  . Smokeless tobacco: Never Used  . Tobacco comment: started smoking age  4 yo 1-2 ppd  Substance and Sexual Activity  . Alcohol use: No  . Drug use: Yes    Types: Marijuana  . Sexual activity: Not on file    Comment: occasionally  Other Topics Concern  . Not on file  Social History Narrative  .  Not on file   Social Determinants of Health   Financial Resource Strain: Low Risk   . Difficulty of Paying Living Expenses: Not hard at all  Food Insecurity: No Food Insecurity  . Worried About Charity fundraiser in the Last Year: Never true  . Ran Out of Food in the Last Year: Never true  Transportation Needs: No Transportation Needs  . Lack of Transportation (Medical): No  . Lack of Transportation (Non-Medical): No  Physical Activity: Inactive  . Days of Exercise per Week: 0 days  . Minutes of Exercise per Session: 0 min  Stress: No Stress Concern Present  . Feeling of Stress : Only a little  Social Connections: Somewhat Isolated  . Frequency of Communication with Friends and Family: More  than three times a week  . Frequency of Social Gatherings with Friends and Family: More than three times a week  . Attends Religious Services: Never  . Active Member of Clubs or Organizations: No  . Attends Archivist Meetings: Never  . Marital Status: Living with partner    Outpatient Encounter Medications as of 09/22/2019  Medication Sig  . cholestyramine light (PREVALITE) 4 GM/DOSE powder Take (4 g total) by mouth 4 (four) times daily as needed. Mixed with water  . hyoscyamine (LEVBID) 0.375 MG 12 hr tablet Take 1 tablet (0.375 mg total) by mouth 2 (two) times daily.  . Probiotic Product (PROBIOTIC PO) Take by mouth daily. Takes occasionally  . triamcinolone cream (KENALOG) 0.1 % Apply 1 application topically daily. (Patient taking differently: Apply 1 application topically daily. As needed)   No facility-administered encounter medications on file as of 09/22/2019.    Activities of Daily Living In your present state of health, do you have any difficulty performing the following activities: 09/22/2019 12/02/2018  Hearing? N N  Vision? N N  Difficulty concentrating or making decisions? N N  Walking or climbing stairs? Y Y  Comment Due to being in a wheelchair. -  Dressing or bathing? N N  Doing errands, shopping? Y N  Comment Does not drive. -  Preparing Food and eating ? N -  Using the Toilet? N -  In the past six months, have you accidently leaked urine? Y -  Comment Wears protection at all times. -  Do you have problems with loss of bowel control? N -  Managing your Medications? N -  Managing your Finances? N -  Housekeeping or managing your Housekeeping? N -  Some recent data might be hidden    Patient Care Team: Birdie Sons, MD as PCP - General (Family Medicine)   Assessment:   This is a routine wellness examination for Lee Clark.  Exercise Activities and Dietary recommendations Current Exercise Habits: Home exercise routine  Goals    . DIET - DECREASE SODA  OR JUICE INTAKE     Recommend to decrease soda intake to 1 a day and increase water intake to 6-8 8 oz glasses a day.        Fall Risk: Fall Risk  09/22/2019 09/16/2018 08/07/2016 08/02/2015  Falls in the past year? 0 0 No No  Number falls in past yr: 0 - - -  Injury with Fall? 0 - - -    FALL RISK PREVENTION PERTAINING TO THE HOME:  Any stairs in or around the home? No  If so, are there any without handrails? N/A  Home free of loose throw rugs in walkways, pet beds, electrical cords, etc? Yes  Adequate lighting in your home to reduce risk of falls? Yes   ASSISTIVE DEVICES UTILIZED TO PREVENT FALLS:  Life alert? No  Use of a cane, walker or w/c? Yes  Grab bars in the bathroom? No  Shower chair or bench in shower? Yes  Elevated toilet seat or a handicapped toilet? No   TIMED UP AND GO:  Was the test performed? No .    Depression Screen PHQ 2/9 Scores 09/22/2019 12/02/2018 09/16/2018 08/13/2017  PHQ - 2 Score 0 0 0 0  PHQ- 9 Score - 0 - 0    Cognitive Function     6CIT Screen 09/22/2019 09/16/2018  What Year? 0 points 0 points  What month? 0 points 0 points  What time? 0 points 0 points  Count back from 20 0 points 0 points  Months in reverse 0 points 2 points  Repeat phrase 0 points 0 points  Total Score 0 2     There is no immunization history on file for this patient.  Qualifies for Shingles Vaccine? Yes . Due for Shingrix. Pt has been advised to call insurance company to determine out of pocket expense. Advised may also receive vaccine at local pharmacy or Health Dept. Verbalized acceptance and understanding.  Tdap: Although this vaccine is not a covered service during a Wellness Exam, does the patient still wish to receive this vaccine today?  No . Advised may receive this vaccine at local pharmacy or Health Dept. Aware to provide a copy of the vaccination record if obtained from local pharmacy or Health Dept. Verbalized acceptance and understanding.  Flu Vaccine:  Due fall 2021.   Screening Tests Health Maintenance  Topic Date Due  . HIV Screening  Never done  . COVID-19 Vaccine (1) Never done  . COLONOSCOPY  01/09/2008  . TETANUS/TDAP  09/21/2020 (Originally 12/16/1975)  . INFLUENZA VACCINE  12/21/2019  . Hepatitis C Screening  Completed   Cancer Screenings:  Colorectal Screening: Completed 01/08/98. Cologuard order on file. Declines completing the cologuard or wanting a colonoscopy referral at this time.   Lung Cancer Screening: (Low Dose CT Chest recommended if Age 66-80 years, 30 pack-year currently smoking OR have quit w/in 15years.) does qualify, however declines.   Additional Screening:  Hepatitis C Screening: Up to date  Vision Screening: Recommended annual ophthalmology exams for early detection of glaucoma and other disorders of the eye.  Dental Screening: Recommended annual dental exams for proper oral hygiene  Community Resource Referral:  CRR required this visit?  No        Plan:  I have personally reviewed and addressed the Medicare Annual Wellness questionnaire and have noted the following in the patient's chart:  A. Medical and social history B. Use of alcohol, tobacco or illicit drugs  C. Current medications and supplements D. Functional ability and status E.  Nutritional status F.  Physical activity G. Advance directives H. List of other physicians I.  Hospitalizations, surgeries, and ER visits in previous 12 months J.  Vitals K. Screenings such as hearing and vision if needed, cognitive and depression L. Referrals and appointments   In addition, I have reviewed and discussed with patient certain preventive protocols, quality metrics, and best practice recommendations. A written personalized care plan for preventive services as well as general preventive health recommendations were provided to patient.   Darrick Huntsman, [place]  06/26/537 Nurse Health Advisor   Nurse Notes: Pt needs an HIV lab at  next in office apt. Declines  a colonoscopy referral or cologuard order at this time.

## 2019-09-22 ENCOUNTER — Other Ambulatory Visit: Payer: Self-pay

## 2019-09-22 ENCOUNTER — Ambulatory Visit (INDEPENDENT_AMBULATORY_CARE_PROVIDER_SITE_OTHER): Payer: Medicare Other

## 2019-09-22 DIAGNOSIS — Z0000 Encounter for general adult medical examination without abnormal findings: Secondary | ICD-10-CM | POA: Diagnosis not present

## 2019-09-22 DIAGNOSIS — F17210 Nicotine dependence, cigarettes, uncomplicated: Secondary | ICD-10-CM

## 2019-09-22 NOTE — Patient Instructions (Signed)
Lee Clark , Thank you for taking time to come for your Medicare Wellness Visit. I appreciate your ongoing commitment to your health goals. Please review the following plan we discussed and let me know if I can assist you in the future.   Screening recommendations/referrals: Colonoscopy: Currently due. Declined a colonoscopy referral or cologuard order today. Recommended yearly ophthalmology/optometry visit for glaucoma screening and checkup Recommended yearly dental visit for hygiene and checkup  Vaccinations: Influenza vaccine: Due fall 2021 Tdap vaccine: Pt declines today.  Shingles vaccine: Pt declines today.     Advanced directives: Advance directive discussed with you today. Please request these forms at next in office apt.   Conditions/risks identified: Recommend to continue to increase water intake to 6-8 8 oz glasses a day.   Next appointment: 12/01/19 @ 1:40 PM with Dr Sherrie Mustache   Preventive Care 40-64 Years, Male Preventive care refers to lifestyle choices and visits with your health care provider that can promote health and wellness. What does preventive care include?  A yearly physical exam. This is also called an annual well check.  Dental exams once or twice a year.  Routine eye exams. Ask your health care provider how often you should have your eyes checked.  Personal lifestyle choices, including:  Daily care of your teeth and gums.  Regular physical activity.  Eating a healthy diet.  Avoiding tobacco and drug use.  Limiting alcohol use.  Practicing safe sex.  Taking low-dose aspirin every day starting at age 98. What happens during an annual well check? The services and screenings done by your health care provider during your annual well check will depend on your age, overall health, lifestyle risk factors, and family history of disease. Counseling  Your health care provider may ask you questions about your:  Alcohol use.  Tobacco use.  Drug  use.  Emotional well-being.  Home and relationship well-being.  Sexual activity.  Eating habits.  Work and work Astronomer. Screening  You may have the following tests or measurements:  Height, weight, and BMI.  Blood pressure.  Lipid and cholesterol levels. These may be checked every 5 years, or more frequently if you are over 60 years old.  Skin check.  Lung cancer screening. You may have this screening every year starting at age 19 if you have a 30-pack-year history of smoking and currently smoke or have quit within the past 15 years.  Fecal occult blood test (FOBT) of the stool. You may have this test every year starting at age 72.  Flexible sigmoidoscopy or colonoscopy. You may have a sigmoidoscopy every 5 years or a colonoscopy every 10 years starting at age 53.  Prostate cancer screening. Recommendations will vary depending on your family history and other risks.  Hepatitis C blood test.  Hepatitis B blood test.  Sexually transmitted disease (STD) testing.  Diabetes screening. This is done by checking your blood sugar (glucose) after you have not eaten for a while (fasting). You may have this done every 1-3 years. Discuss your test results, treatment options, and if necessary, the need for more tests with your health care provider. Vaccines  Your health care provider may recommend certain vaccines, such as:  Influenza vaccine. This is recommended every year.  Tetanus, diphtheria, and acellular pertussis (Tdap, Td) vaccine. You may need a Td booster every 10 years.  Zoster vaccine. You may need this after age 29.  Pneumococcal 13-valent conjugate (PCV13) vaccine. You may need this if you have certain conditions and have  not been vaccinated.  Pneumococcal polysaccharide (PPSV23) vaccine. You may need one or two doses if you smoke cigarettes or if you have certain conditions. Talk to your health care provider about which screenings and vaccines you need and how  often you need them. This information is not intended to replace advice given to you by your health care provider. Make sure you discuss any questions you have with your health care provider. Document Released: 06/04/2015 Document Revised: 01/26/2016 Document Reviewed: 03/09/2015 Elsevier Interactive Patient Education  2017 [employer] Prevention in the Home Falls can cause injuries. They can happen to people of all ages. There are many things you can do to make your home safe and to help prevent falls. What can I do on the outside of my home?  Regularly fix the edges of walkways and driveways and fix any cracks.  Remove anything that might make you trip as you walk through a door, such as a raised step or threshold.  Trim any bushes or trees on the path to your home.  Use bright outdoor lighting.  Clear any walking paths of anything that might make someone trip, such as rocks or tools.  Regularly check to see if handrails are loose or broken. Make sure that both sides of any steps have handrails.  Any raised decks and porches should have guardrails on the edges.  Have any leaves, snow, or ice cleared regularly.  Use sand or salt on walking paths during winter.  Clean up any spills in your garage right away. This includes oil or grease spills. What can I do in the bathroom?  Use night lights.  Install grab bars by the toilet and in the tub and shower. Do not use towel bars as grab bars.  Use non-skid mats or decals in the tub or shower.  If you need to sit down in the shower, use a plastic, non-slip stool.  Keep the floor dry. Clean up any water that spills on the floor as soon as it happens.  Remove soap buildup in the tub or shower regularly.  Attach bath mats securely with double-sided non-slip rug tape.  Do not have throw rugs and other things on the floor that can make you trip. What can I do in the bedroom?  Use night lights.  Make sure that you have a  light by your bed that is easy to reach.  Do not use any sheets or blankets that are too big for your bed. They should not hang down onto the floor.  Have a firm chair that has side arms. You can use this for support while you get dressed.  Do not have throw rugs and other things on the floor that can make you trip. What can I do in the kitchen?  Clean up any spills right away.  Avoid walking on wet floors.  Keep items that you use a lot in easy-to-reach places.  If you need to reach something above you, use a strong step stool that has a grab bar.  Keep electrical cords out of the way.  Do not use floor polish or wax that makes floors slippery. If you must use wax, use non-skid floor wax.  Do not have throw rugs and other things on the floor that can make you trip. What can I do with my stairs?  Do not leave any items on the stairs.  Make sure that there are handrails on both sides of the stairs and use  them. Fix handrails that are broken or loose. Make sure that handrails are as long as the stairways.  Check any carpeting to make sure that it is firmly attached to the stairs. Fix any carpet that is loose or worn.  Avoid having throw rugs at the top or bottom of the stairs. If you do have throw rugs, attach them to the floor with carpet tape.  Make sure that you have a light switch at the top of the stairs and the bottom of the stairs. If you do not have them, ask someone to add them for you. What else can I do to help prevent falls?  Wear shoes that:  Do not have high heels.  Have rubber bottoms.  Are comfortable and fit you well.  Are closed at the toe. Do not wear sandals.  If you use a stepladder:  Make sure that it is fully opened. Do not climb a closed stepladder.  Make sure that both sides of the stepladder are locked into place.  Ask someone to hold it for you, if possible.  Clearly [place] and make sure that you can see:  Any grab bars or  handrails.  First and last steps.  Where the edge of each step is.  Use tools that help you move around (mobility aids) if they are needed. These include:  Canes.  Walkers.  Scooters.  Crutches.  Turn on the lights when you go into a dark area. Replace any light bulbs as soon as they burn out.  Set up your furniture so you have a clear path. Avoid moving your furniture around.  If any of your floors are uneven, fix them.  If there are any pets around you, be aware of where they are.  Review your medicines with your doctor. Some medicines can make you feel dizzy. This can increase your chance of falling. Ask your doctor what other things that you can do to help prevent falls. This information is not intended to replace advice given to you by your health care provider. Make sure you discuss any questions you have with your health care provider. Document Released: 03/04/2009 Document Revised: 10/14/2015 Document Reviewed: 06/12/2014 Elsevier Interactive Patient Education  2017 [employer].

## 2019-09-29 ENCOUNTER — Ambulatory Visit (INDEPENDENT_AMBULATORY_CARE_PROVIDER_SITE_OTHER): Payer: Medicare Other | Admitting: Physician Assistant

## 2019-09-29 DIAGNOSIS — R0981 Nasal congestion: Secondary | ICD-10-CM | POA: Diagnosis not present

## 2019-09-29 DIAGNOSIS — R05 Cough: Secondary | ICD-10-CM | POA: Diagnosis not present

## 2019-09-29 DIAGNOSIS — R059 Cough, unspecified: Secondary | ICD-10-CM

## 2019-09-29 DIAGNOSIS — Z20822 Contact with and (suspected) exposure to covid-19: Secondary | ICD-10-CM

## 2019-09-29 MED ORDER — FLUTICASONE PROPIONATE 50 MCG/ACT NA SUSP: 2.0000 | Freq: Every day | NASAL | 6 refills | Status: DC

## 2019-09-29 MED ORDER — PROMETHAZINE-DM 6.25-15 MG/5ML PO SYRP: 5.0000 mL | ORAL_SOLUTION | Freq: Every evening | ORAL | 0 refills | Status: DC | PRN

## 2019-09-29 MED ORDER — LORATADINE 10 MG PO TABS: 10.0000 mg | ORAL_TABLET | Freq: Every day | ORAL | 1 refills | Status: DC

## 2019-09-29 MED ORDER — LEVOCETIRIZINE DIHYDROCHLORIDE 5 MG PO TABS: 5.0000 mg | ORAL_TABLET | Freq: Every evening | ORAL | 1 refills | Status: DC

## 2019-09-29 NOTE — Progress Notes (Signed)
Established patient visit   Patient: Lee Clark [facility]   DOB: 01/04/1956   64 y.o. Male  MRN: [id number] Visit Date: 09/29/2019  Today's healthcare provider: Trey Sailors, PA-C   Chief Complaint  Patient presents with  . URI  I,Porsha C McClurkin,acting as a scribe for [employer], PA-C.,have documented all relevant documentation on the behalf of Trey Sailors, PA-C,as directed by  Trey Sailors, PA-C while in the presence of Trey Sailors, PA-C.  This visit type was conducted due to national recommendations for restrictions regarding the COVID-19 Pandemic (e.g. social distancing) in an effort to limit this patient's exposure and mitigate transmission in our community. Due to his co-morbid illnesses, this patient is at least at moderate risk for complications without adequate follow up. This format is felt to be most appropriate for this patient at this time. The patient did not have access to video technology or had technical difficulties with video requiring transitioning to audio format only (telephone). Physical exam was limited to content and character of the telephone converstion.    Patient location: Home Provider location: Office  Subjective    URI  This is a new problem. The current episode started 1 to 4 weeks ago. The problem has been unchanged. There has been no fever. Associated symptoms include congestion, coughing, headaches, rhinorrhea, sinus pain, sneezing, a sore throat and wheezing. Pertinent negatives include no diarrhea, ear pain, nausea or vomiting. He has tried antihistamine for the symptoms. The treatment provided mild relief.  Patient reports that symptoms are worse when laying down. Patient has not had COVID vaccines. Patient has 30 pack year smoking history. Denies fevers. He has tried a day of dayquil and some sepacol throat spray.       Medications: Outpatient Medications Prior to Visit  Medication Sig  . cholestyramine light  (PREVALITE) 4 GM/DOSE powder Take (4 g total) by mouth 4 (four) times daily as needed. Mixed with water  . hyoscyamine (LEVBID) 0.375 MG 12 hr tablet Take 1 tablet (0.375 mg total) by mouth 2 (two) times daily.  . Probiotic Product (PROBIOTIC PO) Take by mouth daily. Takes occasionally  . triamcinolone cream (KENALOG) 0.1 % Apply 1 application topically daily. (Patient taking differently: Apply 1 application topically daily. As needed)   No facility-administered medications prior to visit.    Review of Systems  Constitutional: Positive for appetite change. Negative for chills, fatigue and fever.  HENT: Positive for congestion, postnasal drip, rhinorrhea, sinus pressure, sinus pain, sneezing and sore throat. Negative for ear pain.   Respiratory: Positive for cough, chest tightness and wheezing. Negative for shortness of breath.   Gastrointestinal: Negative for diarrhea, nausea and vomiting.  Neurological: Positive for weakness and headaches.      Objective    There were no vitals taken for this visit.   Physical Exam    No results found for any visits on 09/29/19.  Assessment & Plan    1. Suspected COVID-19 virus infection  - symptoms and exam c/w viral URI  - no evidence of strep pharyngitis, CAP, AOM, bacterial sinusitis, or other bacterial infection - concern for possible COVID19 infection - will send for outpatient testing - discussed need to quarantine 10 days from start of symptoms and until fever-free for at least 48 hours - discussed need to quarantine household members - discussed symptomatic management, natural course, and return precautions - May Call back at the end of the week if not feeling better and  will send in antibiotics.    2. Cough  Claritin QD, flonase QD, and phenergan DM daily. May call at end of week if not improving.   I have spent 15 minutes non face to face time with this patient, >50% of which was spent on counseling and coordination of care.     No follow-ups on file.      ITrinna Post, PA-C, have reviewed all documentation for this visit. The documentation on 09/29/19 for the exam, diagnosis, procedures, and orders are all accurate and complete.    Paulene Floor  [facility] [phone] (phone) [phone] (fax)  [place]

## 2019-10-03 ENCOUNTER — Other Ambulatory Visit: Payer: Self-pay | Admitting: Family Medicine

## 2019-10-03 DIAGNOSIS — K580 Irritable bowel syndrome with diarrhea: Secondary | ICD-10-CM

## 2019-10-03 DIAGNOSIS — K529 Noninfective gastroenteritis and colitis, unspecified: Secondary | ICD-10-CM

## 2019-11-25 NOTE — Progress Notes (Signed)
Established patient visit   Patient: Lee Clark [facility]   DOB: 16-Oct-1955   64 y.o. Male  MRN: [id number] Visit Date: 12/01/2019  Today's healthcare provider: [name], MD   No chief complaint on file.  Subjective    HPI  Follow up for Vitamin D Deficiency:  The patient was last seen for this 1 years ago. Changes made at last visit include starting OTC Vitamin D3 2000 units daily;.  He reports poor compliance with treatment. He feels that condition is Unchanged. He is having side effects. Patient states that medication makes him sick " messes my stomach up and causes me to have diarrhea" patient states.   -----------------------------------------------------------------------------------------  Follow up for Chronic Diarrhea/ IBS:  The patient was last seen for this 1 years ago. Changes made at last visit include none; well controlled on current medications.  He reports good compliance with treatment. He feels that condition is Unchanged. He is not having side effects.   -----------------------------------------------------------------------------------------   Allergies  Allergen Reactions  . Ampicillin Hives  . Cephalosporins Other (See Comments)    Pt cannot remember   . Sulfa Antibiotics Diarrhea       Medications: Outpatient Medications Prior to Visit  Medication Sig  . cholestyramine light (PREVALITE) 4 GM/DOSE powder Take (4 g total) by mouth 4 (four) times daily as needed. Mixed with water  . hyoscyamine (LEVBID) 0.375 MG 12 hr tablet Take 1 tablet (0.375 mg total) by mouth 2 (two) times daily.  . fluticasone (FLONASE) 50 MCG/ACT nasal spray Place 2 sprays into both nostrils daily. (Patient not taking: Reported on 12/01/2019)  . levocetirizine (XYZAL) 5 MG tablet Take 1 tablet (5 mg total) by mouth every evening. (Patient not taking: Reported on 12/01/2019)  . loratadine (CLARITIN) 10 MG tablet Take 1 tablet (10 mg total) by mouth daily. (Patient not  taking: Reported on 12/01/2019)  . Probiotic Product (PROBIOTIC PO) Take by mouth daily. Takes occasionally (Patient not taking: Reported on 12/01/2019)  . promethazine-dextromethorphan (PROMETHAZINE-DM) 6.25-15 MG/5ML syrup Take 5 mLs by mouth at bedtime as needed.  . triamcinolone cream (KENALOG) 0.1 % Apply 1 application topically daily. (Patient taking differently: Apply 1 application topically daily. As needed)   No facility-administered medications prior to visit.    Review of Systems   Objective    BP (!) 152/82   Pulse 95   Temp (!) 96.8 F (36 C) (Oral)   Resp 16   SpO2 97%   Physical Exam   General: Appearance:    Thin male in no acute distress  Eyes:    PERRL, conjunctiva/corneas clear, EOM's intact       Lungs:     Clear to auscultation bilaterally, respirations unlabored  Heart:    Normal heart rate. Normal rhythm. No murmurs, rubs, or gallops.   MS:   All extremities are intact.   Neurologic:   Awake, alert, oriented x 3. No apparent focal neurological           defect.         No results found for any visits on 12/01/19.  Assessment & Plan     1. Irritable bowel syndrome with diarrhea Very well controlled with following medications which were refilled today.  - cholestyramine light (PREVALITE) 4 GM/DOSE powder; Take (4 g total) by mouth 4 (four) times daily as needed. Mixed with water  Dispense: 462 g; Refill: 12 - hyoscyamine (LEVBID) 0.375 MG 12 hr tablet; Take 1 tablet (0.375 mg  total) by mouth 2 (two) times daily.  Dispense: 60 tablet; Refill: 12  2. Chronic diarrhea  - cholestyramine light (PREVALITE) 4 GM/DOSE powder; Take (4 g total) by mouth 4 (four) times daily as needed. Mixed with water  Dispense: 462 g; Refill: 12  3. Paraplegia (HCC)   4. Elevated blood pressure reading He states he has been under a lot of stress which two of his children battling drug addiction. He was advised to work on quitting smoking and given information on DASH diet.    5. Vitamin D deficiency He states states he cannot tolerate supplemental and vitamin D and does not want this addressed. Counseled on vitamin d rich foods.   Patient was counseled on recommended screening which he refused to have done. He also requested DNR order be completed. Counseled on DNR provisions and completed form.           The entirety of the information documented in the History of Present Illness, Review of Systems and Physical Exam were personally obtained by me. Portions of this information were initially documented by the CMA and reviewed by me for thoroughness and accuracy.      [name], MD  [facility] [phone] (phone) [phone] (fax)  [facility]

## 2019-12-01 ENCOUNTER — Encounter: Payer: Self-pay | Admitting: Family Medicine

## 2019-12-01 ENCOUNTER — Ambulatory Visit (INDEPENDENT_AMBULATORY_CARE_PROVIDER_SITE_OTHER): Payer: Medicare Other | Admitting: Family Medicine

## 2019-12-01 ENCOUNTER — Other Ambulatory Visit: Payer: Self-pay

## 2019-12-01 VITALS — BP 150/70 | HR 95 | Temp 96.8°F | Resp 16

## 2019-12-01 DIAGNOSIS — R030 Elevated blood-pressure reading, without diagnosis of hypertension: Secondary | ICD-10-CM | POA: Diagnosis not present

## 2019-12-01 DIAGNOSIS — K580 Irritable bowel syndrome with diarrhea: Secondary | ICD-10-CM | POA: Diagnosis not present

## 2019-12-01 DIAGNOSIS — G8220 Paraplegia, unspecified: Secondary | ICD-10-CM | POA: Diagnosis not present

## 2019-12-01 DIAGNOSIS — K529 Noninfective gastroenteritis and colitis, unspecified: Secondary | ICD-10-CM

## 2019-12-01 DIAGNOSIS — F17210 Nicotine dependence, cigarettes, uncomplicated: Secondary | ICD-10-CM

## 2019-12-01 DIAGNOSIS — E559 Vitamin D deficiency, unspecified: Secondary | ICD-10-CM

## 2019-12-01 MED ORDER — CHOLESTYRAMINE LIGHT 4 GM/DOSE PO POWD: ORAL | 12 refills | Status: DC

## 2019-12-01 MED ORDER — HYOSCYAMINE SULFATE ER 0.375 MG PO TB12: 0.3750 mg | ORAL_TABLET | Freq: Two times a day (BID) | ORAL | 12 refills | Status: DC

## 2019-12-01 NOTE — Patient Instructions (Addendum)
DASH Eating Plan DASH stands for "Dietary Approaches to Stop Hypertension." The DASH eating plan is a healthy eating plan that has been shown to reduce high blood pressure (hypertension). It may also reduce your risk for type 2 diabetes, heart disease, and stroke. The DASH eating plan may also help with weight loss. What are tips for following this plan?  General guidelines  Avoid eating more than 2,300 mg (milligrams) of salt (sodium) a day. If you have hypertension, you may need to reduce your sodium intake to 1,500 mg a day.  Limit alcohol intake to no more than 1 drink a day for nonpregnant women and 2 drinks a day for men. One drink equals 12 oz of beer, 5 oz of wine, or 1 oz of hard liquor.  Work with your health care provider to maintain a healthy body weight or to lose weight. Ask what an ideal weight is for you.  Get at least 30 minutes of exercise that causes your heart to beat faster (aerobic exercise) most days of the week. Activities may include walking, swimming, or biking.  Work with your health care provider or diet and nutrition specialist (dietitian) to adjust your eating plan to your individual calorie needs. Reading food labels   Check food labels for the amount of sodium per serving. Choose foods with less than 5 percent of the Daily Value of sodium. Generally, foods with less than 300 mg of sodium per serving fit into this eating plan.  To find whole grains, look for the word "whole" as the first word in the ingredient list. Shopping  Buy products labeled as "low-sodium" or "no salt added."  Buy fresh foods. Avoid canned foods and premade or frozen meals. Cooking  Avoid adding salt when cooking. Use salt-free seasonings or herbs instead of table salt or sea salt. Check with your health care provider or pharmacist before using salt substitutes.  Do not fry foods. Cook foods using healthy methods such as baking, boiling, grilling, and broiling instead.  Cook with  heart-healthy oils, such as olive, canola, soybean, or sunflower oil. Meal planning  Eat a balanced diet that includes: ? 5 or more servings of fruits and vegetables each day. At each meal, try to fill half of your plate with fruits and vegetables. ? Up to 6-8 servings of whole grains each day. ? Less than 6 oz of lean meat, poultry, or fish each day. A 3-oz serving of meat is about the same size as a deck of cards. One egg equals 1 oz. ? 2 servings of low-fat dairy each day. ? A serving of nuts, seeds, or beans 5 times each week. ? Heart-healthy fats. Healthy fats called Omega-3 fatty acids are found in foods such as flaxseeds and coldwater fish, like sardines, salmon, and mackerel.  Limit how much you eat of the following: ? Canned or prepackaged foods. ? Food that is high in trans fat, such as fried foods. ? Food that is high in saturated fat, such as fatty meat. ? Sweets, desserts, sugary drinks, and other foods with added sugar. ? Full-fat dairy products.  Do not salt foods before eating.  Try to eat at least 2 vegetarian meals each week.  Eat more home-cooked food and less restaurant, buffet, and fast food.  When eating at a restaurant, ask that your food be prepared with less salt or no salt, if possible. What foods are recommended? The items listed may not be a complete list. Talk with your dietitian about   what dietary choices are best for you. Grains Whole-grain or whole-wheat bread. Whole-grain or whole-wheat pasta. Brown rice. Oatmeal. Quinoa. Bulgur. Whole-grain and low-sodium cereals. Pita bread. Low-fat, low-sodium crackers. Whole-wheat flour tortillas. Vegetables Fresh or frozen vegetables (raw, steamed, roasted, or grilled). Low-sodium or reduced-sodium tomato and vegetable juice. Low-sodium or reduced-sodium tomato sauce and tomato paste. Low-sodium or reduced-sodium canned vegetables. Fruits All fresh, dried, or frozen fruit. Canned fruit in natural juice (without  added sugar). Meat and other protein foods Skinless chicken or turkey. Ground chicken or turkey. Pork with fat trimmed off. Fish and seafood. Egg whites. Dried beans, peas, or lentils. Unsalted nuts, nut butters, and seeds. Unsalted canned beans. Lean cuts of beef with fat trimmed off. Low-sodium, lean deli meat. Dairy Low-fat (1%) or fat-free (skim) milk. Fat-free, low-fat, or reduced-fat cheeses. Nonfat, low-sodium ricotta or cottage cheese. Low-fat or nonfat yogurt. Low-fat, low-sodium cheese. Fats and oils Soft margarine without trans fats. Vegetable oil. Low-fat, reduced-fat, or light mayonnaise and salad dressings (reduced-sodium). Canola, safflower, olive, soybean, and sunflower oils. Avocado. Seasoning and other foods Herbs. Spices. Seasoning mixes without salt. Unsalted popcorn and pretzels. Fat-free sweets. What foods are not recommended? The items listed may not be a complete list. Talk with your dietitian about what dietary choices are best for you. Grains Baked goods made with fat, such as croissants, muffins, or some breads. Dry pasta or rice meal packs. Vegetables Creamed or fried vegetables. Vegetables in a cheese sauce. Regular canned vegetables (not low-sodium or reduced-sodium). Regular canned tomato sauce and paste (not low-sodium or reduced-sodium). Regular tomato and vegetable juice (not low-sodium or reduced-sodium). Pickles. Olives. Fruits Canned fruit in a light or heavy syrup. Fried fruit. Fruit in cream or butter sauce. Meat and other protein foods Fatty cuts of meat. Ribs. Fried meat. Bacon. Sausage. Bologna and other processed lunch meats. Salami. Fatback. Hotdogs. Bratwurst. Salted nuts and seeds. Canned beans with added salt. Canned or smoked fish. Whole eggs or egg yolks. Chicken or turkey with skin. Dairy Whole or 2% milk, cream, and half-and-half. Whole or full-fat cream cheese. Whole-fat or sweetened yogurt. Full-fat cheese. Nondairy creamers. Whipped toppings.  Processed cheese and cheese spreads. Fats and oils Butter. Stick margarine. Lard. Shortening. Ghee. Bacon fat. Tropical oils, such as coconut, palm kernel, or palm oil. Seasoning and other foods Salted popcorn and pretzels. Onion salt, garlic salt, seasoned salt, table salt, and sea salt. Worcestershire sauce. Tartar sauce. Barbecue sauce. Teriyaki sauce. Soy sauce, including reduced-sodium. Steak sauce. Canned and packaged gravies. Fish sauce. Oyster sauce. Cocktail sauce. Horseradish that you find on the shelf. Ketchup. Mustard. Meat flavorings and tenderizers. Bouillon cubes. Hot sauce and Tabasco sauce. Premade or packaged marinades. Premade or packaged taco seasonings. Relishes. Regular salad dressings. Where to find more information:  National Heart, Lung, and Blood Institute: www.nhlbi.nih.gov  American Heart Association: www.heart.org Summary  The DASH eating plan is a healthy eating plan that has been shown to reduce high blood pressure (hypertension). It may also reduce your risk for type 2 diabetes, heart disease, and stroke.  With the DASH eating plan, you should limit salt (sodium) intake to 2,300 mg a day. If you have hypertension, you may need to reduce your sodium intake to 1,500 mg a day.  When on the DASH eating plan, aim to eat more fresh fruits and vegetables, whole grains, lean proteins, low-fat dairy, and heart-healthy fats.  Work with your health care provider or diet and nutrition specialist (dietitian) to adjust your eating plan to your   individual calorie needs. This information is not intended to replace advice given to you by your health care provider. Make sure you discuss any questions you have with your health care provider. Document Revised: 04/20/2017 Document Reviewed: 05/01/2016 Elsevier Patient Education  2020 Elsevier Inc.  

## 2020-10-11 DIAGNOSIS — H524 Presbyopia: Secondary | ICD-10-CM | POA: Diagnosis not present

## 2020-10-11 DIAGNOSIS — H5213 Myopia, bilateral: Secondary | ICD-10-CM | POA: Diagnosis not present

## 2020-10-11 DIAGNOSIS — H2513 Age-related nuclear cataract, bilateral: Secondary | ICD-10-CM | POA: Diagnosis not present

## 2020-10-11 DIAGNOSIS — H52223 Regular astigmatism, bilateral: Secondary | ICD-10-CM | POA: Diagnosis not present

## 2020-11-08 ENCOUNTER — Other Ambulatory Visit: Payer: Self-pay | Admitting: Family Medicine

## 2020-11-08 DIAGNOSIS — K580 Irritable bowel syndrome with diarrhea: Secondary | ICD-10-CM

## 2020-11-08 DIAGNOSIS — K529 Noninfective gastroenteritis and colitis, unspecified: Secondary | ICD-10-CM

## 2020-11-08 NOTE — Telephone Encounter (Signed)
   Notes to clinic:  Patient has appt on 12/06/2020 Review for another refill until appt    Requested Prescriptions  Pending Prescriptions Disp Refills   cholestyramine light (PREVALITE) 4 GM/DOSE powder [Pharmacy Med Name: CHOLESTYRAMINE LIGHT POWDER] 462 g 0    Sig: Take (4 g total) by mouth 4 (four) times daily as needed. Mixed with water      Cardiovascular:  Antilipid - Bile Acid Sequestrants Failed - 11/08/2020  8:43 AM      Failed - Total Cholesterol in normal range and within 360 days    Cholesterol, Total  Date Value Ref Range Status  08/13/2017 159 100 - 199 mg/dL Final          Failed - LDL in normal range and within 360 days    LDL Calculated  Date Value Ref Range Status  08/13/2017 74 0 - 99 mg/dL Final          Failed - HDL in normal range and within 360 days    HDL  Date Value Ref Range Status  08/13/2017 73 >39 mg/dL Final          Failed - Triglycerides in normal range and within 360 days    Triglycerides  Date Value Ref Range Status  08/13/2017 61 0 - 149 mg/dL Final          Passed - Valid encounter within last 12 months    Recent Outpatient Visits           11 months ago Irritable bowel syndrome with diarrhea   [facility] Malva Limes, MD   1 year ago Suspected COVID-19 virus infection   [facility] [place], [place], [place]   1 year ago Facial rash   [facility] [place], [place], [place]   1 year ago Prostate cancer screening   [facility] Malva Limes, MD   3 years ago Medicare annual wellness visit, subsequent   [facility] Malva Limes, MD       Future Appointments             In 4 weeks Fisher, Demetrios Isaacs, MD [facility]

## 2020-12-06 ENCOUNTER — Encounter: Payer: Self-pay | Admitting: Family Medicine

## 2020-12-06 ENCOUNTER — Telehealth: Payer: Self-pay | Admitting: Family Medicine

## 2020-12-06 ENCOUNTER — Ambulatory Visit (INDEPENDENT_AMBULATORY_CARE_PROVIDER_SITE_OTHER): Payer: Medicare Other | Admitting: Family Medicine

## 2020-12-06 ENCOUNTER — Other Ambulatory Visit: Payer: Self-pay

## 2020-12-06 VITALS — BP 135/82 | HR 91 | Temp 98.1°F | Resp 16

## 2020-12-06 DIAGNOSIS — K580 Irritable bowel syndrome with diarrhea: Secondary | ICD-10-CM

## 2020-12-06 DIAGNOSIS — M7022 Olecranon bursitis, left elbow: Secondary | ICD-10-CM

## 2020-12-06 DIAGNOSIS — K529 Noninfective gastroenteritis and colitis, unspecified: Secondary | ICD-10-CM | POA: Diagnosis not present

## 2020-12-06 DIAGNOSIS — J301 Allergic rhinitis due to pollen: Secondary | ICD-10-CM

## 2020-12-06 MED ORDER — FLUTICASONE PROPIONATE 50 MCG/ACT NA SUSP: 2.0000 | Freq: Every day | NASAL | 6 refills | Status: DC

## 2020-12-06 MED ORDER — HYOSCYAMINE SULFATE ER 0.375 MG PO TB12: 0.3750 mg | ORAL_TABLET | Freq: Two times a day (BID) | ORAL | 12 refills | Status: DC

## 2020-12-06 MED ORDER — CHOLESTYRAMINE LIGHT 4 GM/DOSE PO POWD: ORAL | 12 refills | Status: DC

## 2020-12-06 MED ORDER — CHOLESTYRAMINE LIGHT 4 GM/DOSE PO POWD: 4.0000 g | Freq: Four times a day (QID) | ORAL | 12 refills | Status: DC | PRN

## 2020-12-06 NOTE — Telephone Encounter (Signed)
Please review. Thanks!  

## 2020-12-06 NOTE — Progress Notes (Deleted)
      Established patient visit   Patient: Lee Clark [facility]   DOB: [birth year]   65 y.o. Male  MRN: [id number] Visit Date: 12/06/2020  Today's healthcare provider: [name], MD   No chief complaint on file.  Subjective    HPI  Follow up for IBS  The patient was last seen for this 1 years ago. Changes made at last visit include none very well controlled on Prevalite and Hyoscamine.  He reports {excellent/good/fair/poor:19665} compliance with treatment. He feels that condition is {improved/worse/unchanged:3041574}. He {is/is not:21021397} having side effects. ***  -----------------------------------------------------------------------------------------   Patient Active Problem List   Diagnosis Date Noted   Paraplegia (HCC) 08/13/2017   Chronic diarrhea 12/28/2016   Smoking greater than 30 pack years 08/02/2015   Clinical depression 07/14/2015   Blood glucose elevated 07/14/2015   Esophageal reflux 07/14/2015   Irritable bowel syndrome 07/14/2015   Vitamin D deficiency 07/14/2015   Decubitus ulcer 11/26/2014   Past Medical History:  Diagnosis Date   Colon polyp 07/14/2015   History of chicken pox    Paraplegia following spinal cord injury (HCC)    partial with minimal movement, sensation from injury at L1 level   Shingles    Stroke [facility])    1970's   Tibial plateau fracture, left 11/26/2014   Allergies  Allergen Reactions   Ampicillin Hives   Cephalosporins Other (See Comments)    Pt cannot remember    Sulfa Antibiotics Diarrhea       Medications: Outpatient Medications Prior to Visit  Medication Sig   cholestyramine light (PREVALITE) 4 GM/DOSE powder Take (4 g total) by mouth 4 (four) times daily as needed. Mixed with water   fluticasone (FLONASE) 50 MCG/ACT nasal spray Place 2 sprays into both nostrils daily. (Patient not taking: Reported on 12/01/2019)   hyoscyamine (LEVBID) 0.375 MG 12 hr tablet Take 1 tablet (0.375 mg total) by mouth 2 (two) times daily.    levocetirizine (XYZAL) 5 MG tablet Take 1 tablet (5 mg total) by mouth every evening. (Patient not taking: Reported on 12/01/2019)   loratadine (CLARITIN) 10 MG tablet Take 1 tablet (10 mg total) by mouth daily. (Patient not taking: Reported on 12/01/2019)   promethazine-dextromethorphan (PROMETHAZINE-DM) 6.25-15 MG/5ML syrup Take 5 mLs by mouth at bedtime as needed.   triamcinolone cream (KENALOG) 0.1 % Apply 1 application topically daily. (Patient taking differently: Apply 1 application topically daily. As needed)   No facility-administered medications prior to visit.    Review of Systems  {Labs  Heme  Chem  Endocrine  Serology  Results Review (optional):23779}   Objective    There were no vitals taken for this visit. {Show previous vital signs (optional):23777}   Physical Exam  ***  No results found for any visits on 12/06/20.  Assessment & Plan     ***  No follow-ups on file.      {provider attestation***:1}   [name], MD  [facility] [phone] (phone) [phone] (fax)  [facility]

## 2020-12-06 NOTE — Telephone Encounter (Signed)
[address] called about RX for cholestyramine light (PREVALITE) 4 GM/DOSE powder  The direction say 4g daily but pt has been taking 4g daily 4xs a day/ they wanted to make sure the new Rx directions were correct/ if not please send new script

## 2020-12-06 NOTE — Patient Instructions (Addendum)
Please review the attached list of medications and notify my office if there are any errors.   Please bring all of your medications to every appointment so we can make sure that our medication list is the same as yours.   Wear an elastic elbow band persistently to get fluid down. Call for an orthopedic referral if swelling is not down within 2 months.   Start taking OTC vitamin D3 500 units twice a day as tolerated

## 2020-12-06 NOTE — Progress Notes (Signed)
Established patient visit   Patient: Lee Clark   DOB: 19-Mar-1956   65 y.o. Male  MRN: [id number] Visit Date: 12/06/2020  Today's healthcare provider: [name], MD   Chief Complaint  Patient presents with   Irritable Bowel Syndrome   swelling in left elbow   Subjective    HPI  Follow up for IBS  The patient was last seen for this 1 years ago. Changes made at last visit include no medication changes.  He reports good compliance with treatment. He feels that condition is Unchanged. He is not having side effects.   Patient also mentions that he has swelling in his left elbow. He reports that it has been there for several weeks. It reports that it is very painful.       Medications: Outpatient Medications Prior to Visit  Medication Sig   cholestyramine light (PREVALITE) 4 GM/DOSE powder Take (4 g total) by mouth 4 (four) times daily as needed. Mixed with water   hyoscyamine (LEVBID) 0.375 MG 12 hr tablet Take 1 tablet (0.375 mg total) by mouth 2 (two) times daily.   triamcinolone cream (KENALOG) 0.1 % Apply 1 application topically daily. (Patient taking differently: Apply 1 application topically daily. As needed)   fluticasone (FLONASE) 50 MCG/ACT nasal spray Place 2 sprays into both nostrils daily. (Patient not taking: No sig reported)   levocetirizine (XYZAL) 5 MG tablet Take 1 tablet (5 mg total) by mouth every evening. (Patient not taking: No sig reported)   loratadine (CLARITIN) 10 MG tablet Take 1 tablet (10 mg total) by mouth daily. (Patient not taking: No sig reported)   promethazine-dextromethorphan (PROMETHAZINE-DM) 6.25-15 MG/5ML syrup Take 5 mLs by mouth at bedtime as needed. (Patient not taking: Reported on 12/06/2020)   No facility-administered medications prior to visit.    Review of Systems  Constitutional:  Negative for activity change and fatigue.  HENT:  Positive for congestion, postnasal drip and sneezing.   Gastrointestinal:  Negative for  blood in stool, diarrhea, nausea and rectal pain.  Musculoskeletal:  Positive for arthralgias and joint swelling.  Neurological:  Negative for dizziness, light-headedness and headaches.       Objective    BP 135/82   Pulse 91   Temp 98.1 F (36.7 C)   Resp 16    Physical Exam    General: Appearance:    Thin male in no acute distress. Sitting in wheelchair in no acute distress.   Eyes:    PERRL, conjunctiva/corneas clear, EOM's intact       Lungs:     Clear to auscultation bilaterally, respirations unlabored  Heart:    Normal heart rate. Normal rhythm. No murmurs, rubs, or gallops.    MS:   All extremities are intact.  Fist size soft not tender non inflamed swelling over left olecranon.         Assessment & Plan     1. Olecranon bursitis of left elbow Recommend mild compression and protection with elbow wrap. He can call for referral for aspiration if it becomes bothersome or not improving over 1-2 months.   2. Allergic rhinitis due to pollen, unspecified seasonality - fluticasone (FLONASE) 50 MCG/ACT nasal spray; Place 2 sprays into both nostrils daily.  Dispense: 16 g; Refill: 6  4. Irritable bowel syndrome with diarrhea refill hyoscyamine (LEVBID) 0.375 MG 12 hr tablet; Take 1 tablet (0.375 mg total) by mouth 2 (two) times daily.  Dispense: 60 tablet; Refill: 12 - cholestyramine light (  PREVALITE) 4 GM/DOSE powder; Take 4grams daily  Dispense: 462 g; Refill: 12  5. Chronic diarrhea - cholestyramine light (PREVALITE) 4 GM/DOSE powder; Take 4grams daily  Dispense: 462 g; Refill: 12         The entirety of the information documented in the History of Present Illness, Review of Systems and Physical Exam were personally obtained by me. Portions of this information were initially documented by the CMA and reviewed by me for thoroughness and accuracy.     [name], MD  [facility] [phone] (phone) [phone] (fax)  [facility]

## 2020-12-17 ENCOUNTER — Ambulatory Visit: Payer: Self-pay | Admitting: *Deleted

## 2020-12-17 DIAGNOSIS — R0981 Nasal congestion: Secondary | ICD-10-CM

## 2020-12-17 MED ORDER — AZELASTINE HCL 0.1 % NA SOLN: 2.0000 | Freq: Two times a day (BID) | NASAL | 1 refills | Status: DC

## 2020-12-17 NOTE — Telephone Encounter (Signed)
Advised patient as below.  

## 2020-12-17 NOTE — Telephone Encounter (Signed)
Have sent prescription for Astelin nasal spray to take instead. Sometimes it works better than [employer].

## 2020-12-17 NOTE — Addendum Note (Signed)
Addended by: [name] E on: 12/17/2020 12:16 PM   Modules accepted: Orders

## 2020-12-17 NOTE — Telephone Encounter (Signed)
Patient called in says flonase given doesn't seem to be working and he says he has lesions in his nose. He wants to know what else he can do. Please call back  Reason for Disposition  [1] Caller has NON-URGENT medicine question about med that PCP prescribed AND [2] triager unable to answer question  Answer Assessment - Initial Assessment Questions 1. NAME of MEDICATION: "What medicine are you calling about?"     Flonase 2. QUESTION: "What is your question?" (e.g., double dose of medicine, side effect)     Not helping symptoms 3. PRESCRIBING HCP: "Who prescribed it?" Reason: if prescribed by specialist, call should be referred to that group.     PCP 4. SYMPTOMS: "Do you have any symptoms?"     Nasal drainage/inflammation  Patient states he was prescribed Flonase for his sinus drainage and inflammation in nasal passages. Patient reports he has been using the Flonase for 1 week and his symptoms are not better. Patient thinks he may need different medication for allergy treatment. Patient advised would send comments to PCP for review.  Protocols used: Medication Question Call-A-AH

## 2020-12-17 NOTE — Telephone Encounter (Signed)
Any recommendations?

## 2020-12-23 ENCOUNTER — Ambulatory Visit: Payer: Self-pay | Admitting: *Deleted

## 2020-12-23 NOTE — Telephone Encounter (Signed)
Reason for Disposition . MILD constipation  Answer Assessment - Initial Assessment Questions 1. STOOL PATTERN OR FREQUENCY: "How often do you have a bowel movement (BM)?"  (Normal range: 3 times a day to every 3 days)  "When was your last BM?"      7 days ago after bout of diarrhea and taking several doses of antidiarrheal tabs 2. STRAINING: "Do you have to strain to have a BM?"      No bm  3. RECTAL PAIN: "Does your rectum hurt when the stool comes out?" If Yes, ask: "Do you have hemorrhoids? How bad is the pain?"  (Scale 1-10; or mild, moderate, severe)     Na/no bm and no pain at this time 4. STOOL COMPOSITION: "Are the stools hard?"      Sometimes regular stools are hard 5. BLOOD ON STOOLS: "Has there been any blood on the toilet tissue or on the surface of the BM?" If Yes, ask: "When was the last time?"      none 6. CHRONIC CONSTIPATION: "Is this a new problem for you?"  If no, ask: "How long have you had this problem?" (days, weeks, months)      yes 7. CHANGES IN DIET OR HYDRATION: "Have there been any recent changes in your diet?" "How much fluids are you drinking on a daily basis?"  "How much have you had to drink today?"     Probably not 5-6 cups of water daily 8. MEDICATIONS: "Have you been taking any new medications?" "Are you taking any narcotic pain medications?" (e.g., Vicodin, Percocet, morphine, Dilaudid)     no 9. LAXATIVES: "Have you been using any stool softeners, laxatives, or enemas?"  If yes, ask "What, how often, and when was the last time?"     Yes, last week for 2 or 3 days, imodium 10. ACTIVITY:  "How much walking do you do every day?"  "Has your activity level decreased in the past week?"        Wheelchair bound 11. CAUSE: "What do you think is causing the constipation?"       Had diarrhea prior to no stools this week 12. OTHER SYMPTOMS: "Do you have any other symptoms?" (e.g., abdominal pain, bloating, fever, vomiting)       none 13. MEDICAL HISTORY: "Do you  have a history of hemorrhoids, rectal fissures, or rectal surgery or rectal abscess?"         no 14. PREGNANCY: "Is there any chance you are pregnant?" "When was your last me.  NA  Protocols used: Constipation-A-AH

## 2020-12-28 ENCOUNTER — Telehealth: Payer: Self-pay

## 2020-12-28 DIAGNOSIS — J3489 Other specified disorders of nose and nasal sinuses: Secondary | ICD-10-CM

## 2020-12-28 NOTE — Telephone Encounter (Signed)
Copied from CRM [id number]. Topic: Referral - Request for Referral >> Dec 28, 2020  9:33 AM Gwenlyn Fudge wrote: Has patient seen PCP for this complaint? Yes.   *If NO, is insurance requiring patient see PCP for this issue before PCP can refer them? Referral for which specialty: ENT  Preferred provider/office: [facility]  Reason for referral: Pt states that the nose spray he was prescribed is snot helping him. He is requesting to be seen by a specialist. Please advise.

## 2021-01-27 DIAGNOSIS — J300 Vasomotor rhinitis: Secondary | ICD-10-CM | POA: Diagnosis not present

## 2021-01-27 DIAGNOSIS — J340 Abscess, furuncle and carbuncle of nose: Secondary | ICD-10-CM | POA: Diagnosis not present

## 2021-02-07 ENCOUNTER — Telehealth: Payer: Self-pay

## 2021-02-07 DIAGNOSIS — M7022 Olecranon bursitis, left elbow: Secondary | ICD-10-CM

## 2021-02-07 NOTE — Addendum Note (Signed)
Addended by: Malva Limes on: 02/07/2021 04:47 PM   Modules accepted: Orders

## 2021-02-07 NOTE — Telephone Encounter (Signed)
Copied from CRM [id number]. Topic: Referral - Request for Referral >> Feb 07, 2021  2:30 PM Aretta Nip wrote: Pt has called re: a referral for his elbow/ seen 7/18 Copied from notes: 3. Malva Limes, MD (Physician) at 12/06/2020 2:09 PM - Signed  Wear an elastic elbow band persistently to get fluid down. Call for an orthopedic referral if swelling is not down within 2 months.   Pt states that elbow fluid not any better after wearing brace for 2 months. Pt would like a referral to [facility] on [address] with specfic dr  Dr Juanell Fairly Please contact pt at [phone]

## 2021-02-07 NOTE — Telephone Encounter (Signed)
Is it okay to refer pt to ortho?   Thanks,   -Vernona Rieger

## 2021-02-16 ENCOUNTER — Ambulatory Visit (INDEPENDENT_AMBULATORY_CARE_PROVIDER_SITE_OTHER): Payer: Medicare Other | Admitting: Family Medicine

## 2021-02-16 DIAGNOSIS — Z0000 Encounter for general adult medical examination without abnormal findings: Secondary | ICD-10-CM | POA: Diagnosis not present

## 2021-02-16 NOTE — Patient Instructions (Signed)
Preventive Care 64-64 Years Old, Male Preventive care refers to lifestyle choices and visits with your health care provider that can promote health and wellness. This includes: A yearly physical exam. This is also called an annual wellness visit. Regular dental and eye exams. Immunizations. Screening for certain conditions. Healthy lifestyle choices, such as: Eating a healthy diet. Getting regular exercise. Not using drugs or products that contain nicotine and tobacco. Limiting alcohol use. What can I expect for my preventive care visit? Physical exam Your health care provider will check your: Height and weight. These may be used to calculate your BMI (body mass index). BMI is a measurement that tells if you are at a healthy weight. Heart rate and blood pressure. Body temperature. Skin for abnormal spots. Counseling Your health care provider may ask you questions about your: Past medical problems. Family's medical history. Alcohol, tobacco, and drug use. Emotional well-being. Home life and relationship well-being. Sexual activity. Diet, exercise, and sleep habits. Work and work environment. Access to firearms. What immunizations do I need? Vaccines are usually given at various ages, according to a schedule. Your health care provider will recommend vaccines for you based on your age, medical history, and lifestyle or other factors, such as travel or where you work. What tests do I need? Blood tests Lipid and cholesterol levels. These may be checked every 5 years, or more often if you are over 50 years old. Hepatitis C test. Hepatitis B test. Screening Lung cancer screening. You may have this screening every year starting at age 55 if you have a 30-pack-year history of smoking and currently smoke or have quit within the past 15 years. Prostate cancer screening. Recommendations will vary depending on your family history and other risks. Genital exam to check for testicular cancer  or hernias. Colorectal cancer screening. All adults should have this screening starting at age 50 and continuing until age 75. Your health care provider may recommend screening at age 45 if you are at increased risk. You will have tests every 1-10 years, depending on your results and the type of screening test. Diabetes screening. This is done by checking your blood sugar (glucose) after you have not eaten for a while (fasting). You may have this done every 1-3 years. STD (sexually transmitted disease) testing, if you are at risk. Follow these instructions at home: Eating and drinking  Eat a diet that includes fresh fruits and vegetables, whole grains, lean protein, and low-fat dairy products. Take vitamin and mineral supplements as recommended by your health care provider. Do not drink alcohol if your health care provider tells you not to drink. If you drink alcohol: Limit how much you have to 0-2 drinks a day. Be aware of how much alcohol is in your drink. In the [place] one drink equals one 12 oz bottle of beer (355 mL), one 5 oz glass of wine (148 mL), or one 1 oz glass of hard liquor (44 mL). Lifestyle Take daily care of your teeth and gums. Brush your teeth every morning and night with fluoride toothpaste. Floss one time each day. Stay active. Exercise for at least 30 minutes 5 or more days each week. Do not use any products that contain nicotine or tobacco, such as cigarettes, e-cigarettes, and chewing tobacco. If you need help quitting, ask your health care provider. Do not use drugs. If you are sexually active, practice safe sex. Use a condom or other form of protection to prevent STIs (sexually transmitted infections). If told by your   health care provider, take low-dose aspirin daily starting at age 50. Find healthy ways to cope with stress, such as: Meditation, yoga, or listening to music. Journaling. Talking to a trusted person. Spending time with friends and  family. Safety Always wear your seat belt while driving or riding in a vehicle. Do not drive: If you have been drinking alcohol. Do not ride with someone who has been drinking. When you are tired or distracted. While texting. Wear a helmet and other protective equipment during sports activities. If you have firearms in your house, make sure you follow all gun safety procedures. What's next? Go to your health care provider once a year for an annual wellness visit. Ask your health care provider how often you should have your eyes and teeth checked. Stay up to date on all vaccines. This information is not intended to replace advice given to you by your health care provider. Make sure you discuss any questions you have with your health care provider. Document Revised: 07/16/2020 Document Reviewed: 05/02/2018 [employer]vier Patient Education  2022 [employer]   

## 2021-02-16 NOTE — Progress Notes (Signed)
Subjective:   Lee Clark is a 65 y.o. male who presents for Medicare Annual/Subsequent preventive examination.  I connected with  Acie Fredrickson [facility] on 02/16/21 by an audio only telemedicine application and verified that I am speaking with the correct person using two identifiers.   I discussed the limitations, risks, security and privacy concerns of performing an evaluation and management service by telephone and the availability of in person appointments. I also discussed with the patient that there may be a patient responsible charge related to this service. The patient expressed understanding and verbally consented to this telephonic visit.  Location of Patient: Home Location of Provider: Office  List any persons and their role that are participating in the visit with the patient.  Nicholes Rough, CMA  Review of Systems     Refer to PCP     Objective:    There were no vitals filed for this visit. There is no height or weight on file to calculate BMI.  Advanced Directives 09/22/2019 09/16/2018 08/07/2016 11/25/2014 10/29/2014  Does Patient Have a Medical Advance Directive? No No No No No  Would patient like information on creating a medical advance directive? No - Patient declined No - Guardian declined - Yes - Educational materials given No - patient declined information    Current Medications (verified) Outpatient Encounter Medications as of 02/16/2021  Medication Sig   azelastine (ASTELIN) 0.1 % nasal spray Place 2 sprays into both nostrils 2 (two) times daily. Use in each nostril as directed   cholestyramine light (PREVALITE) 4 GM/DOSE powder Take 1 packet (4 g total) by mouth 4 (four) times daily as needed.   fluticasone (FLONASE) 50 MCG/ACT nasal spray Place 2 sprays into both nostrils daily.   hyoscyamine (LEVBID) 0.375 MG 12 hr tablet Take 1 tablet (0.375 mg total) by mouth 2 (two) times daily.   promethazine-dextromethorphan (PROMETHAZINE-DM) 6.25-15 MG/5ML syrup Take 5  mLs by mouth at bedtime as needed.   triamcinolone cream (KENALOG) 0.1 % Apply 1 application topically daily. (Patient taking differently: Apply 1 application topically daily. As needed)   No facility-administered encounter medications on file as of 02/16/2021.    Allergies (verified) Ampicillin, Cephalosporins, and Sulfa antibiotics   History: Past Medical History:  Diagnosis Date   Colon polyp 07/14/2015   History of chicken pox    Paraplegia following spinal cord injury (HCC)    partial with minimal movement, sensation from injury at L1 level   Shingles    Stroke [facility])    1970's   Tibial plateau fracture, left 11/26/2014   Past Surgical History:  Procedure Laterality Date   BACK SURGERY     gangrene on bilateral buttoks   BUTTOCK MASS EXCISION     bilateral, due to pressure sore that became gangrene   CHOLECYSTECTOMY     Family History  Problem Relation Age of Onset   Cancer Mother    Diabetes Mother    Hypertension Father    Stroke Father    Lupus Sister    Social History   Socioeconomic History   Marital status: Divorced    Spouse name: Not on file   Number of children: 5   Years of education: Not on file   Highest education level: 9th grade  Occupational History   Not on file  Tobacco Use   Smoking status: Every Day    Packs/day: 0.50    Types: Cigarettes    Start date: 1970   Smokeless tobacco: Never  Tobacco comments:    started smoking age  70 yo 1-2 ppd  Vaping Use   Vaping Use: Never used  Substance and Sexual Activity   Alcohol use: No   Drug use: Not Currently    Types: Marijuana   Sexual activity: Not Currently    Comment: occasionally  Other Topics Concern   Not on file  Social History Narrative   Not on file   Social Determinants of Health   Financial Resource Strain: Low Risk    Difficulty of Paying Living Expenses: Not hard at all  Food Insecurity: No Food Insecurity   Worried About Programme researcher, broadcasting/film/video in the Last Year: Never  true   Ran Out of Food in the Last Year: Never true  Transportation Needs: No Transportation Needs   Lack of Transportation (Medical): No   Lack of Transportation (Non-Medical): No  Physical Activity: Inactive   Days of Exercise per Week: 0 days   Minutes of Exercise per Session: 0 min  Stress: No Stress Concern Present   Feeling of Stress : Not at all  Social Connections: Moderately Isolated   Frequency of Communication with Friends and Family: More than three times a week   Frequency of Social Gatherings with Friends and Family: More than three times a week   Attends Religious Services: Never   Database administrator or Organizations: No   Attends Engineer, structural: Never   Marital Status: Living with partner    Tobacco Counseling Ready to quit: Not Answered Counseling given: Not Answered Tobacco comments: started smoking age  56 yo 1-2 ppd   Clinical Intake:                 Diabetic?No         Activities of Daily Living In your present state of health, do you have any difficulty performing the following activities: 02/16/2021  Hearing? N  Vision? N  Difficulty concentrating or making decisions? N  Walking or climbing stairs? Y  Dressing or bathing? N  Doing errands, shopping? Y  Some recent data might be hidden    Patient Care Team: Malva Limes, MD as PCP - General (Family Medicine)  Indicate any recent Medical Services you may have received from other than [facility] providers in the past year (date may be approximate).     Assessment:   This is a routine wellness examination for [facility].  Hearing/Vision screen No results found.  Dietary issues and exercise activities discussed:     Goals Addressed   None   Depression Screen PHQ 2/9 Scores 02/16/2021 09/22/2019 12/02/2018 09/16/2018 08/13/2017 08/07/2016 08/02/2015  PHQ - 2 Score 0 0 0 0 0 0 0  PHQ- 9 Score - - 0 - 0 0 1    Fall Risk Fall Risk  02/16/2021 09/22/2019 09/16/2018 08/07/2016  08/02/2015  Falls in the past year? 0 0 0 No No  Number falls in past yr: 0 0 - - -  Injury with Fall? 0 0 - - -  Risk for fall due to : Impaired mobility - - - -  Follow up Falls evaluation completed - - - -    FALL RISK PREVENTION PERTAINING TO THE HOME:  Any stairs in or around the home?  Parapalegic If so, are there any without handrails?  Parapalegic Home free of loose throw rugs in walkways, pet beds, electrical cords, etc?  Parapalegic Adequate lighting in your home to reduce risk of falls? Yes  ASSISTIVE DEVICES UTILIZED TO PREVENT FALLS:  Life alert? No  Use of a cane, walker or w/c? Yes  Grab bars in the bathroom? No  Shower chair or bench in shower? No  Elevated toilet seat or a handicapped toilet? No   TIMED UP AND GO:  Was the test performed? No .  Length of time to ambulate 10 feet:  sec.   Telehealth  Cognitive Function:     6CIT Screen 02/16/2021 09/22/2019 09/16/2018  What Year? 0 points 0 points 0 points  What month? 0 points 0 points 0 points  What time? 0 points 0 points 0 points  Count back from 20 0 points 0 points 0 points  Months in reverse 0 points 0 points 2 points  Repeat phrase 0 points 0 points 0 points  Total Score 0 0 2    Immunizations  There is no immunization history on file for this patient.  TDAP status: Due, Education has been provided regarding the importance of this vaccine. Advised may receive this vaccine at local pharmacy or Health Dept. Aware to provide a copy of the vaccination record if obtained from local pharmacy or Health Dept. Verbalized acceptance and understanding.  Flu Vaccine status: Due, Education has been provided regarding the importance of this vaccine. Advised may receive this vaccine at local pharmacy or Health Dept. Aware to provide a copy of the vaccination record if obtained from local pharmacy or Health Dept. Verbalized acceptance and understanding.  Pneumococcal vaccine status: Due, Education has been  provided regarding the importance of this vaccine. Advised may receive this vaccine at local pharmacy or Health Dept. Aware to provide a copy of the vaccination record if obtained from local pharmacy or Health Dept. Verbalized acceptance and understanding.  Covid-19 vaccine status: Declined, Education has been provided regarding the importance of this vaccine but patient still declined. Advised may receive this vaccine at local pharmacy or Health Dept.or vaccine clinic. Aware to provide a copy of the vaccination record if obtained from local pharmacy or Health Dept. Verbalized acceptance and understanding.  Qualifies for Shingles Vaccine? Yes   Zostavax completed No   Shingrix Completed?: No.    Education has been provided regarding the importance of this vaccine. Patient has been advised to call insurance company to determine out of pocket expense if they have not yet received this vaccine. Advised may also receive vaccine at local pharmacy or Health Dept. Verbalized acceptance and understanding.  Screening Tests Health Maintenance  Topic Date Due   COVID-19 Vaccine (1) Never done   HIV Screening  Never done   TETANUS/TDAP  Never done   Zoster Vaccines- Shingrix (1 of 2) Never done   COLONOSCOPY (Pts 45-90yrs Insurance coverage will need to be confirmed)  01/09/2008   INFLUENZA VACCINE  Never done   Hepatitis C Screening  Completed   HPV VACCINES  Aged Out    Health Maintenance  Health Maintenance Due  Topic Date Due   COVID-19 Vaccine (1) Never done   HIV Screening  Never done   TETANUS/TDAP  Never done   Zoster Vaccines- Shingrix (1 of 2) Never done   COLONOSCOPY (Pts 45-19yrs Insurance coverage will need to be confirmed)  01/09/2008   INFLUENZA VACCINE  Never done    Colorectal cancer screening: Referral to GI placed Patient declined. Pt aware the office will call re: appt.  Lung Cancer Screening: (Low Dose CT Chest recommended if Age 55-80 years, 30 pack-year currently smoking  OR have quit w/in 15years.)  does qualify.   Lung Cancer Screening Referral: Patient Declined  Additional Screening:  Hepatitis C Screening: does qualify; Patient declined  Vision Screening: Recommended annual ophthalmology exams for early detection of glaucoma and other disorders of the eye. Is the patient up to date with their annual eye exam?  Yes  Who is the provider or what is the name of the office in which the patient attends annual eye exams? Dr. Mellody Dance If pt is not established with a provider, would they like to be referred to a provider to establish care? No .   Dental Screening: Recommended annual dental exams for proper oral hygiene  Community Resource Referral / Chronic Care Management: CRR required this visit?  No   CCM required this visit?  No      Plan:     I have personally reviewed and noted the following in the patient's chart:   Medical and social history Use of alcohol, tobacco or illicit drugs  Current medications and supplements including opioid prescriptions. Patient is currently taking opioid prescriptions. Information provided to patient regarding non-opioid alternatives. Patient advised to discuss non-opioid treatment plan with their provider. Functional ability and status Nutritional status Physical activity Advanced directives List of other physicians Hospitalizations, surgeries, and ER visits in previous 12 months Vitals Screenings to include cognitive, depression, and falls Referrals and appointments  In addition, I have reviewed and discussed with patient certain preventive protocols, quality metrics, and best practice recommendations. A written personalized care plan for preventive services as well as general preventive health recommendations were provided to patient.     Laren Everts Ahria Slappey, CMA   02/16/2021   Nurse Notes: Non face to face 40 minutes

## 2021-02-17 DIAGNOSIS — J340 Abscess, furuncle and carbuncle of nose: Secondary | ICD-10-CM | POA: Diagnosis not present

## 2021-02-17 DIAGNOSIS — H93292 Other abnormal auditory perceptions, left ear: Secondary | ICD-10-CM | POA: Diagnosis not present

## 2021-02-28 DIAGNOSIS — M7022 Olecranon bursitis, left elbow: Secondary | ICD-10-CM | POA: Diagnosis not present

## 2021-04-11 DIAGNOSIS — H2513 Age-related nuclear cataract, bilateral: Secondary | ICD-10-CM | POA: Diagnosis not present

## 2021-04-28 DIAGNOSIS — J329 Chronic sinusitis, unspecified: Secondary | ICD-10-CM | POA: Diagnosis not present

## 2021-04-28 DIAGNOSIS — J310 Chronic rhinitis: Secondary | ICD-10-CM | POA: Diagnosis not present

## 2021-04-28 DIAGNOSIS — J300 Vasomotor rhinitis: Secondary | ICD-10-CM | POA: Diagnosis not present

## 2021-05-24 ENCOUNTER — Ambulatory Visit: Payer: Self-pay | Admitting: *Deleted

## 2021-05-24 ENCOUNTER — Telehealth: Payer: Medicare Other | Admitting: Family Medicine

## 2021-05-24 DIAGNOSIS — R051 Acute cough: Secondary | ICD-10-CM

## 2021-05-24 DIAGNOSIS — F17210 Nicotine dependence, cigarettes, uncomplicated: Secondary | ICD-10-CM

## 2021-05-24 DIAGNOSIS — J988 Other specified respiratory disorders: Secondary | ICD-10-CM | POA: Diagnosis not present

## 2021-05-24 MED ORDER — BENZONATATE 100 MG PO CAPS: 100.0000 mg | ORAL_CAPSULE | Freq: Two times a day (BID) | ORAL | 0 refills | Status: DC | PRN

## 2021-05-24 MED ORDER — DOXYCYCLINE HYCLATE 100 MG PO TABS: 100.0000 mg | ORAL_TABLET | Freq: Two times a day (BID) | ORAL | 0 refills | Status: AC

## 2021-05-24 NOTE — Telephone Encounter (Signed)
FYI

## 2021-05-24 NOTE — Telephone Encounter (Signed)
Reason for Disposition  SEVERE coughing spells (e.g., whooping sound after coughing, vomiting after coughing)  Answer Assessment - Initial Assessment Questions 1. ONSET: "When did the cough begin?"      I'm coughing up thick yellow mucus.   Started a week ago yesterday.   On Monday started with a sore throat then the cough started, rattling in my chest and just coughing a lot.  I have a headache, fever 101, diarrhea.   I've been sick for a week.   2. SEVERITY: "How bad is the cough today?"      *No Answer* 3. SPUTUM: "Describe the color of your sputum" (none, dry cough; clear, white, yellow, green)     Thick yellow mucus.    Fever for a few days now 4. HEMOPTYSIS: "Are you coughing up any blood?" If so ask: "How much?" (flecks, streaks, tablespoons, etc.)     *No Answer* 5. DIFFICULTY BREATHING: "Are you having difficulty breathing?" If Yes, ask: "How bad is it?" (e.g., mild, moderate, severe)    - MILD: No SOB at rest, mild SOB with walking, speaks normally in sentences, can lie down, no retractions, pulse < 100.    - MODERATE: SOB at rest, SOB with minimal exertion and prefers to sit, cannot lie down flat, speaks in phrases, mild retractions, audible wheezing, pulse 100-120.    - SEVERE: Very SOB at rest, speaks in single words, struggling to breathe, sitting hunched forward, retractions, pulse > 120      I can't get in due to transportation.   6. FEVER: "Do you have a fever?" If Yes, ask: "What is your temperature, how was it measured, and when did it start?"     101 for a fever for a few days.   He is doing a Control and instrumentation engineer visit.    7. CARDIAC HISTORY: "Do you have any history of heart disease?" (e.g., heart attack, congestive heart failure)      *No Answer* 8. LUNG HISTORY: "Do you have any history of lung disease?"  (e.g., pulmonary embolus, asthma, emphysema)     *No Answer* 9. PE RISK FACTORS: "Do you have a history of blood clots?" (or: recent major surgery, recent prolonged  travel, bedridden)     *No Answer* 10. OTHER SYMPTOMS: "Do you have any other symptoms?" (e.g., runny nose, wheezing, chest pain)       See above 11. PREGNANCY: "Is there any chance you are pregnant?" "When was your last menstrual period?"       *No Answer* 12. TRAVEL: "Have you traveled out of the country in the last month?" (e.g., travel history, exposures)       *No Answer*  Protocols used: Cough - Acute Productive-A-AH

## 2021-05-24 NOTE — Patient Instructions (Addendum)
COVID test recommend if not improved.  Please follow up with your if no improved-   We hope you feel better soon.

## 2021-05-24 NOTE — Progress Notes (Signed)
Virtual Visit Consent   Lee Clark [facility], you are scheduled for a virtual visit with a [facility] provider today.     Just as with appointments in the office, your consent must be obtained to participate.  Your consent will be active for this visit and any virtual visit you may have with one of our providers in the next 365 days.     If you have a MyChart account, a copy of this consent can be sent to you electronically.  All virtual visits are billed to your insurance company just like a traditional visit in the office.    As this is a virtual visit, video technology does not allow for your provider to perform a traditional examination.  This may limit your provider's ability to fully assess your condition.  If your provider identifies any concerns that need to be evaluated in person or the need to arrange testing (such as labs, EKG, etc.), we will make arrangements to do so.     Although advances in technology are sophisticated, we cannot ensure that it will always work on either your end or our end.  If the connection with a video visit is poor, the visit may have to be switched to a telephone visit.  With either a video or telephone visit, we are not always able to ensure that we have a secure connection.     I need to obtain your verbal consent now.   Are you willing to proceed with your visit today?    Lee Clark [facility] has provided verbal consent on 05/24/2021 for a virtual visit (video or telephone).   Lee Finner, NP   Date: 05/24/2021 10:31 AM   Virtual Visit via Video Note   I, Lee Clark, connected with  Lee Clark  ([id number], [birth year]) on 05/24/21 at 11:00 AM EST by a video-enabled telemedicine application and verified that I am speaking with the correct person using two identifiers.  Location: Patient: Virtual Visit Location Patient: Home Provider: Virtual Visit Location Provider: Home Office   I discussed the limitations of evaluation and management by  telemedicine and the availability of in person appointments. The patient expressed understanding and agreed to proceed.    History of Present Illness: Lee Clark is a 66 y.o. who identifies as a male who was assigned male at birth, and is being seen today for started a week ago- started with a sore throat, developed a cough and it sounds rattling. Cough from chest- sputum color is thick and yellow green.  Started having loose BM and diarrhea. Also is having headaches.  Started the Monday after Christmas. Smoker- but not smoking due to the cough. Denies chest pain, shortness of breath.   Denies having COVID test- negative Vaccines: none for COVID or flu   Problems:  Patient Active Problem List   Diagnosis Date Noted   Paraplegia (HCC) 08/13/2017   Chronic diarrhea 12/28/2016   Smoking greater than 30 pack years 08/02/2015   Clinical depression 07/14/2015   Blood glucose elevated 07/14/2015   Esophageal reflux 07/14/2015   Irritable bowel syndrome 07/14/2015   Vitamin D deficiency 07/14/2015   Decubitus ulcer 11/26/2014    Allergies:  Allergies  Allergen Reactions   Ampicillin Hives   Cephalosporins Other (See Comments)    Pt cannot remember    Sulfa Antibiotics Diarrhea   Medications:  Current Outpatient Medications:    azelastine (ASTELIN) 0.1 % nasal spray, Place 2 sprays into both nostrils 2 (  two) times daily. Use in each nostril as directed, Disp: 30 mL, Rfl: 1   cholestyramine light (PREVALITE) 4 GM/DOSE powder, Take 1 packet (4 g total) by mouth 4 (four) times daily as needed., Disp: 462 g, Rfl: 12   fluticasone (FLONASE) 50 MCG/ACT nasal spray, Place 2 sprays into both nostrils daily., Disp: 16 g, Rfl: 6   hyoscyamine (LEVBID) 0.375 MG 12 hr tablet, Take 1 tablet (0.375 mg total) by mouth 2 (two) times daily., Disp: 60 tablet, Rfl: 12   promethazine-dextromethorphan (PROMETHAZINE-DM) 6.25-15 MG/5ML syrup, Take 5 mLs by mouth at bedtime as needed., Disp: 118 mL, Rfl:  0   triamcinolone cream (KENALOG) 0.1 %, Apply 1 application topically daily. (Patient taking differently: Apply 1 application topically daily. As needed), Disp: 80 g, Rfl: 1  Observations/Objective: Patient is well-developed, well-nourished in no acute distress.  Resting comfortably  at home.  Head is normocephalic, atraumatic.  Speech is clear and coherent with logical content.  Patient is alert and oriented at baseline.  Hoarseness Cough- rhonic sound  Assessment and Plan:  1. Respiratory infection Suggest COVID testing, in setting of smoker and over a week duration and worsening will treat for resp infx. Advised to avoid smoking and to take medication in full. Mucinex recommended    Reviewed side effects, risks and benefits of medication.    Patient acknowledged agreement and understanding of the plan.    - doxycycline (VIBRA-TABS) 100 MG tablet; Take 1 tablet (100 mg total) by mouth 2 (two) times daily for 10 days.  Dispense: 20 tablet; Refill: 0  2. Smoking greater than 30 pack years Encouraged smoking cessation  3. Acute cough Most bothersome symptom at this time  - benzonatate (TESSALON) 100 MG capsule; Take 1 capsule (100 mg total) by mouth 2 (two) times daily as needed for cough.  Dispense: 20 capsule; Refill: 0    Follow Up Instructions: I discussed the assessment and treatment plan with the patient. The patient was provided an opportunity to ask questions and all were answered. The patient agreed with the plan and demonstrated an understanding of the instructions.  A copy of instructions were sent to the patient via MyChart unless otherwise noted below.     The patient was advised to call back or seek an in-person evaluation if the symptoms worsen or if the condition fails to improve as anticipated.  Time:  I spent 21 minutes with the patient via telehealth technology discussing the above problems/concerns.    Lee Finner, NP

## 2021-05-24 NOTE — Telephone Encounter (Signed)
°  Chief Complaint: Cough, fever 101, diarrhea, congestion Symptoms: cough, congestion and diarrhea for a week along with fever for the last few days Frequency: N/A Pertinent Negatives: Patient denies shortnes of breath or chest pain Disposition: [] ED /[] Urgent Care (no appt availability in office) / [] Appointment(In office/virtual)/ [x]  [place] [facility]/ [] Home Care/ [] Refused Recommended Disposition /[] [place] [facility]/ []  Follow-up with PCP Additional Notes: Pt has decided to do a MyChart virtual visit when I returned his call.

## 2021-05-25 ENCOUNTER — Ambulatory Visit: Payer: Self-pay | Admitting: *Deleted

## 2021-05-25 MED ORDER — AZITHROMYCIN 250 MG PO TABS: ORAL_TABLET | ORAL | 0 refills | Status: AC

## 2021-05-25 NOTE — Telephone Encounter (Signed)
Pt advised.   Thanks,   -Akif Weldy  

## 2021-05-25 NOTE — Telephone Encounter (Signed)
° °  Chief Complaint: Diarrhea from Doxy Symptoms: Diarrhea, cramping before BMs Frequency: yesterday, 4 times, 1 time today Pertinent Negatives: Patient denies abdominal pain, dehydration Disposition: [] ED /[] Urgent Care (no appt availability in office) / [] Appointment(In office/virtual)/ []  [place] [facility]/ [] Home Care/ [] Refused Recommended Disposition /[] [place] [facility]/ []  Follow-up with PCP Additional Notes: Pt did Evisit yesterday, URI,prescribed Doxy. Took 2 doses yesterday and 1 this AM. "Could never tolerate that, I'd appreciate it of Dr, would give me a Zpack." States if so, would need to be called in prior to 83 to so med could be delivered.        Reason for Disposition  Patient wants to stop the antibiotic  Answer Assessment - Initial Assessment Questions 1. ANTIBIOTIC: "What antibiotic are you taking?" "How many times per day?"     Doxy 2. ANTIBIOTIC ONSET: "When was the antibiotic started?"     Yesterday 3. DIARRHEA SEVERITY: "How bad is the diarrhea?" "How many more stools have you had in the past 24 hours than normal?"    - NO DIARRHEA (SCALE 0)   - MILD (SCALE 1-3): Few loose or mushy BMs; increase of 1-3 stools over normal daily number of stools; mild increase in ostomy output.   -  MODERATE (SCALE 4-7): Increase of 4-6 stools daily over normal; moderate increase in ostomy output. * SEVERE (SCALE 8-10; OR 'WORST POSSIBLE'): Increase of 7 or more stools daily over normal; moderate increase in ostomy output; incontinence.     4 times 4. ONSET: "When did the diarrhea begin?"      yesterday 5. BM CONSISTENCY: "How loose or watery is the diarrhea?"      *No Answer* 6. VOMITING: "Are you also vomiting?" If Yes, ask: "How many times in the past 24 hours?"      no 7. ABDOMINAL PAIN: "Are you having any abdominal pain?" If Yes, ask: "What does it feel like?" (e.g., crampy, dull, intermittent, constant)      no 8. ABDOMINAL PAIN SEVERITY:  If present, ask: "How bad is the pain?"  (e.g., Scale 1-10; mild, moderate, or severe)   - MILD (1-3): doesn't interfere with normal activities, abdomen soft and not tender to touch    - MODERATE (4-7): interferes with normal activities or awakens from sleep, abdomen tender to touch    - SEVERE (8-10): excruciating pain, doubled over, unable to do any normal activities       NA 9. ORAL INTAKE: If vomiting, "Have you been able to drink liquids?" "How much liquids have you had in the past 24 hours?"     yes 10. HYDRATION: "Any signs of dehydration?" (e.g., dry mouth [not just dry lips], too weak to stand, dizziness, new weight loss) "When did you last urinate?"       No 11. EXPOSURE: "Have you traveled to a foreign country recently?" "Have you been exposed to anyone with diarrhea?" "Could you have eaten any food that was spoiled?"     no 12. OTHER SYMPTOMS: "Do you have any other symptoms?" (e.g., fever, blood in stool)       no  Protocols used: Diarrhea on Antibiotics-A-AH

## 2021-05-25 NOTE — Telephone Encounter (Signed)
Prescription sent to [facility] court

## 2021-06-13 DIAGNOSIS — J300 Vasomotor rhinitis: Secondary | ICD-10-CM | POA: Diagnosis not present

## 2021-07-12 ENCOUNTER — Telehealth: Payer: Self-pay

## 2021-07-12 NOTE — Telephone Encounter (Signed)
Copied from [place] [id number]. Topic: General - Other >> Jul 12, 2021 12:21 PM Loma Boston wrote: Office called to cancel appt for CPE 7/17  Pt return call states error, AWV Nurse  02/16/21 stated as reason to cancel /review call 6:00 pm 60 min was 9/28 not CPE with provider [facility] resch to 12/19/21

## 2021-07-22 ENCOUNTER — Encounter: Payer: Self-pay | Admitting: Family Medicine

## 2021-08-15 DIAGNOSIS — J310 Chronic rhinitis: Secondary | ICD-10-CM | POA: Diagnosis not present

## 2021-08-15 DIAGNOSIS — J300 Vasomotor rhinitis: Secondary | ICD-10-CM | POA: Diagnosis not present

## 2021-08-15 DIAGNOSIS — J343 Hypertrophy of nasal turbinates: Secondary | ICD-10-CM | POA: Diagnosis not present

## 2021-08-29 ENCOUNTER — Other Ambulatory Visit: Payer: Self-pay | Admitting: Family Medicine

## 2021-08-29 DIAGNOSIS — R21 Rash and other nonspecific skin eruption: Secondary | ICD-10-CM

## 2021-08-29 NOTE — Telephone Encounter (Signed)
Requested medications are due for refill today.  Unsure. ? ?Requested medications are on the active medications list.  yes ? ?Last refill. 06/27/2019 80 g 1 refill ? ?Future visit scheduled.   yes ? ?Notes to clinic.  Medication refill is not delegated. ? ? ? ?Requested Prescriptions  ?Pending Prescriptions Disp Refills  ? triamcinolone cream (KENALOG) 0.1 % 80 g 1  ?  Sig: Apply 1 application. topically daily.  ?  ? Not Delegated - Dermatology:  Corticosteroids Failed - 08/29/2021 11:57 AM  ?  ?  Failed - This refill cannot be delegated  ?  ?  Passed - Valid encounter within last 12 months  ?  Recent Outpatient Visits   ? ?      ? 6 months ago Encounter for [employer] annual wellness exam  ? [facility] Malva Limes, MD  ? 8 months ago Olecranon bursitis of left elbow  ? [facility] Malva Limes, MD  ? 1 year ago Irritable bowel syndrome with diarrhea  ? [facility] Sherrie Mustache, Demetrios Isaacs, MD  ? 1 year ago Suspected COVID-19 virus infection  ? [facility] [place], [place], [place]  ? 2 years ago Facial rash  ? [facility] [place], [place], [place]  ? ?  ?  ?Future Appointments   ? ?        ? In 3 months Fisher, Demetrios Isaacs, MD [facility], [facility]  ? ?  ? ?  ?  ?  ?  ?

## 2021-08-29 NOTE — Telephone Encounter (Signed)
Copied from CRM [id number]. Topic: Quick Communication - Rx Refill/Question ?>> Aug 29, 2021  8:43 AM Jaquita Rector A wrote: ?Medication: triamcinolone cream (KENALOG) 0.1 % ? ?Has the patient contacted their pharmacy? Yes.  Need new Rx  ?(Agent: If no, request that the patient contact the pharmacy for the refill. If patient does not wish to contact the pharmacy document the reason why and proceed with request.) ?(Agent: If yes, when and what did the pharmacy advise?) ? ?Preferred Pharmacy (with phone number or street name): [facility] [place], [place] - [address]  ?Phone:  [phone] ?Fax:  [phone] ? ? ? ?Has the patient been seen for an appointment in the last year OR does the patient have an upcoming appointment? Yes.   ? ?Agent: Please be advised that RX refills may take up to 3 business days. We ask that you follow-up with your pharmacy. ?

## 2021-09-14 ENCOUNTER — Other Ambulatory Visit: Payer: Self-pay | Admitting: Family Medicine

## 2021-09-14 DIAGNOSIS — R21 Rash and other nonspecific skin eruption: Secondary | ICD-10-CM

## 2021-09-14 NOTE — Telephone Encounter (Signed)
Medication Refill - Medication:  ? ?triamcinolone cream (KENALOG) 0.1 % ? ?Has the patient contacted their pharmacy? Yes.  Contact pcp office.  ?(Agent: If no, request that the patient contact the pharmacy for the refill. If patient does not wish to contact the pharmacy document the reason why and proceed with request.) ?(Agent: If yes, when and what did the pharmacy advise?) ? ?Preferred Pharmacy (with phone number or street name):  ? ?[facility] [place], [place] - [address]  ?[address] [place] [address]  ?Phone: [phone] Fax: [phone]  ? ?Has the patient been seen for an appointment in the last year OR does the patient have an upcoming appointment? Yes.   ? ?Agent: Please be advised that RX refills may take up to 3 business days. We ask that you follow-up with your pharmacy. ? ?

## 2021-09-15 ENCOUNTER — Other Ambulatory Visit: Payer: Self-pay | Admitting: *Deleted

## 2021-09-15 ENCOUNTER — Encounter: Payer: Self-pay | Admitting: Family Medicine

## 2021-09-15 DIAGNOSIS — R21 Rash and other nonspecific skin eruption: Secondary | ICD-10-CM

## 2021-09-15 MED ORDER — TRIAMCINOLONE ACETONIDE 0.1 % EX CREA: 1.0000 "application " | TOPICAL_CREAM | Freq: Every day | CUTANEOUS | 1 refills | Status: DC

## 2021-09-15 NOTE — Telephone Encounter (Signed)
Duplicate request- filled by provider today ?Requested Prescriptions  ?Pending Prescriptions Disp Refills  ?? triamcinolone cream (KENALOG) 0.1 % 80 g 1  ?  Sig: Apply 1 application. topically daily.  ?  ? Not Delegated - Dermatology:  Corticosteroids Failed - 09/15/2021 10:13 AM  ?  ?  Failed - This refill cannot be delegated  ?  ?  Passed - Valid encounter within last 12 months  ?  Recent Outpatient Visits   ?      ? 7 months ago Encounter for [employer] annual wellness exam  ? [facility] Malva Limes, MD  ? 9 months ago Olecranon bursitis of left elbow  ? [facility] Malva Limes, MD  ? 1 year ago Irritable bowel syndrome with diarrhea  ? [facility] Sherrie Mustache, Demetrios Isaacs, MD  ? 1 year ago Suspected COVID-19 virus infection  ? [facility] [place], [place], [place]  ? 2 years ago Facial rash  ? [facility] [place], [place], [place]  ?  ?  ?Future Appointments   ?        ? In 3 months Fisher, Demetrios Isaacs, MD [facility], [facility]  ?  ? ?  ?  ?  ? ?

## 2021-10-10 DIAGNOSIS — H2513 Age-related nuclear cataract, bilateral: Secondary | ICD-10-CM | POA: Diagnosis not present

## 2021-10-10 DIAGNOSIS — H52223 Regular astigmatism, bilateral: Secondary | ICD-10-CM | POA: Diagnosis not present

## 2021-10-10 DIAGNOSIS — H5213 Myopia, bilateral: Secondary | ICD-10-CM | POA: Diagnosis not present

## 2021-10-10 DIAGNOSIS — H524 Presbyopia: Secondary | ICD-10-CM | POA: Diagnosis not present

## 2021-10-29 ENCOUNTER — Other Ambulatory Visit: Payer: Self-pay | Admitting: Family Medicine

## 2021-10-29 DIAGNOSIS — K529 Noninfective gastroenteritis and colitis, unspecified: Secondary | ICD-10-CM

## 2021-10-29 DIAGNOSIS — K580 Irritable bowel syndrome with diarrhea: Secondary | ICD-10-CM

## 2021-11-28 DIAGNOSIS — M7022 Olecranon bursitis, left elbow: Secondary | ICD-10-CM | POA: Diagnosis not present

## 2021-12-05 ENCOUNTER — Ambulatory Visit: Payer: Medicare Other | Admitting: Family Medicine

## 2021-12-19 ENCOUNTER — Ambulatory Visit: Payer: Medicare Other | Admitting: Family Medicine

## 2021-12-19 DIAGNOSIS — M7022 Olecranon bursitis, left elbow: Secondary | ICD-10-CM | POA: Diagnosis not present

## 2022-01-09 ENCOUNTER — Ambulatory Visit (INDEPENDENT_AMBULATORY_CARE_PROVIDER_SITE_OTHER): Payer: Medicare Other | Admitting: Family Medicine

## 2022-01-09 ENCOUNTER — Encounter: Payer: Self-pay | Admitting: Family Medicine

## 2022-01-09 VITALS — BP 136/80 | HR 85 | Temp 98.7°F | Resp 20

## 2022-01-09 DIAGNOSIS — R739 Hyperglycemia, unspecified: Secondary | ICD-10-CM | POA: Diagnosis not present

## 2022-01-09 DIAGNOSIS — K580 Irritable bowel syndrome with diarrhea: Secondary | ICD-10-CM | POA: Diagnosis not present

## 2022-01-09 DIAGNOSIS — F17210 Nicotine dependence, cigarettes, uncomplicated: Secondary | ICD-10-CM | POA: Diagnosis not present

## 2022-01-09 DIAGNOSIS — Z125 Encounter for screening for malignant neoplasm of prostate: Secondary | ICD-10-CM | POA: Diagnosis not present

## 2022-01-09 DIAGNOSIS — E559 Vitamin D deficiency, unspecified: Secondary | ICD-10-CM

## 2022-01-09 DIAGNOSIS — G8220 Paraplegia, unspecified: Secondary | ICD-10-CM | POA: Diagnosis not present

## 2022-01-09 DIAGNOSIS — J301 Allergic rhinitis due to pollen: Secondary | ICD-10-CM | POA: Diagnosis not present

## 2022-01-09 NOTE — Progress Notes (Signed)
I,Roshena L Chambers,acting as a scribe for [name], MD.,have documented all relevant documentation on the behalf of [name], MD,as directed by  [name], MD while in the presence of [name], MD.    Established patient visit   Patient: Lee Clark [facility]   DOB: July 08, 1955   66 y.o. Male  MRN: [id number] Visit Date: 01/09/2022  Today's healthcare provider: [name], MD   Chief Complaint  Patient presents with   Follow-up   Subjective    HPI  Follow up for vitamin D deficiency:  The patient was last seen for this 3 years ago. Changes made at last visit include advising patient to take OTC vitamin D3 1000 units daily and check yearly.  He reports poor compliance with treatment. Patient is not currently taking any Vitamion D supplements.His baseline vitamin D 25-oh is under 10. He did not tolerated 50k weekly or 2k daily doses. Lab Results  Component Value Date   VD25OH 12.2 (L) 12/02/2018   He was advised to take 1,000 daily after last checked, but he 's not sure now what the last dose he tried was. States he is currently trying to control by getting more sun and drinking more milk.  He feels that condition is Unchanged. He is not having side effects.   -----------------------------------------------------------------------------------------  He is also due for follow up nasal drainage and IBS. States that daily cholestyramine and hyoscyamine continue to work well to control IBS, and atrovent nasal spray is working well for sinus drainage.   Medications: Outpatient Medications Prior to Visit  Medication Sig   cholestyramine light (PREVALITE) 4 GM/DOSE powder Take (4 g total) by mouth 4 (four) times daily as needed. Mixed with water   hyoscyamine (LEVBID) 0.375 MG 12 hr tablet Take 1 tablet (0.375 mg total) by mouth 2 (two) times daily.   ipratropium (ATROVENT) 0.06 % nasal spray Place 1 spray into both nostrils in the morning, at noon, and at bedtime.    triamcinolone cream (KENALOG) 0.1 % Apply 1 application. topically daily.   [DISCONTINUED] azelastine (ASTELIN) 0.1 % nasal spray Place 2 sprays into both nostrils 2 (two) times daily. Use in each nostril as directed   [DISCONTINUED] benzonatate (TESSALON) 100 MG capsule Take 1 capsule (100 mg total) by mouth 2 (two) times daily as needed for cough.   [DISCONTINUED] fluticasone (FLONASE) 50 MCG/ACT nasal spray Place 2 sprays into both nostrils daily.   [DISCONTINUED] promethazine-dextromethorphan (PROMETHAZINE-DM) 6.25-15 MG/5ML syrup Take 5 mLs by mouth at bedtime as needed.   No facility-administered medications prior to visit.    Review of Systems  Constitutional:  Negative for appetite change, chills and fever.  Respiratory:  Negative for chest tightness, shortness of breath and wheezing.   Cardiovascular:  Negative for chest pain and palpitations.  Gastrointestinal:  Negative for abdominal pain, nausea and vomiting.       Objective    BP 136/80 (BP Location: Right Arm, Patient Position: Sitting, Cuff Size: Normal)   Pulse 85   Temp 98.7 F (37.1 C) (Oral)   Resp 20   SpO2 98% Comment: room air   Physical Exam    General: Appearance:    Thin male sitting in wheel chair in no acute distress  Eyes:    PERRL, conjunctiva/corneas clear, EOM's intact       Lungs:     Clear to auscultation bilaterally, respirations unlabored  Heart:    Normal heart rate. Normal rhythm. No murmurs, rubs, or gallops.  MS:   All extremities are intact. Wasting of lower extremities due to paraplegia.    Neurologic:   Awake, alert, oriented x 3. No apparent focal neurological defect.         Assessment & Plan     1. Paraplegia (HCC)   2. Vitamin D deficiency He refused to check levels today since he states he had to pay out of pocket last time and it was over $100.   Recommend that he start by taking the lower OTC dose and titrate up as tolerated.   3. Blood glucose elevated  -  Glucose  4. Smoking greater than 30 pack years Encouraged smoking cessation but states he is not ready to quit.   5. Prostate cancer screening  - PSA Total (Reflex To Free) ([facility] only)  6. Irritable bowel syndrome with diarrhea  7. Non-seasonal allergic rhinitis due to pollen  - ipratropium (ATROVENT) 0.06 % nasal spray; Place 1 spray into both nostrils in the morning, at noon, and at bedtime.     The entirety of the information documented in the History of Present Illness, Review of Systems and Physical Exam were personally obtained by me. Portions of this information were initially documented by the CMA and reviewed by me for thoroughness and accuracy.     [name], MD  [facility] [phone] (phone) [phone] (fax)  [facility]

## 2022-01-10 LAB — PSA TOTAL (REFLEX TO FREE): Prostate Specific Ag, Serum: 1.4 ng/mL (ref 0.0–4.0)

## 2022-01-10 LAB — GLUCOSE, RANDOM: Glucose: 88 mg/dL (ref 70–99)

## 2022-01-19 DIAGNOSIS — H2513 Age-related nuclear cataract, bilateral: Secondary | ICD-10-CM | POA: Insufficient documentation

## 2022-02-02 ENCOUNTER — Other Ambulatory Visit: Payer: Self-pay | Admitting: Family Medicine

## 2022-02-02 DIAGNOSIS — K580 Irritable bowel syndrome with diarrhea: Secondary | ICD-10-CM

## 2022-02-06 ENCOUNTER — Other Ambulatory Visit: Payer: Self-pay | Admitting: Family Medicine

## 2022-02-06 DIAGNOSIS — K529 Noninfective gastroenteritis and colitis, unspecified: Secondary | ICD-10-CM

## 2022-02-06 DIAGNOSIS — K580 Irritable bowel syndrome with diarrhea: Secondary | ICD-10-CM

## 2022-02-07 ENCOUNTER — Telehealth: Payer: Self-pay | Admitting: Family Medicine

## 2022-02-07 NOTE — Telephone Encounter (Signed)
Copied from [place] [id number]. Topic: Medicare AWV >> Feb 07, 2022 11:55 AM Jae Dire wrote: Reason for CRM:  Left message for patient to call back and schedule Medicare Annual Wellness Visit (AWV) in office.   If unable to come into the office for AWV,  please offer to do virtually or by telephone.  Last AWV: : 02/16/2021  Please schedule at anytime with [facility] Health Advisor.  30 minute appointment for Virtual or phone 45 minute appointment for in office or Initial virtual/phone  Any questions, please contact me at [phone]

## 2022-02-20 ENCOUNTER — Ambulatory Visit (INDEPENDENT_AMBULATORY_CARE_PROVIDER_SITE_OTHER): Payer: Medicare Other

## 2022-02-20 DIAGNOSIS — Z789 Other specified health status: Secondary | ICD-10-CM

## 2022-02-20 DIAGNOSIS — Z0000 Encounter for general adult medical examination without abnormal findings: Secondary | ICD-10-CM

## 2022-02-20 NOTE — Progress Notes (Signed)
Virtual Visit via Telephone Note  I connected with  Lee Clark on 02/20/22 at  3:30 PM EDT by telephone and verified that I am speaking with the correct person using two identifiers.  Location: Patient: home Provider: [facility] Persons participating in the virtual visit: [place]   I discussed the limitations, risks, security and privacy concerns of performing an evaluation and management service by telephone and the availability of in person appointments. The patient expressed understanding and agreed to proceed.  Interactive audio and video telecommunications were attempted between this nurse and patient, however failed, due to patient having technical difficulties OR patient did not have access to video capability.  We continued and completed visit with audio only.  Some vital signs may be absent or patient reported.   Dionisio David, LPN  Subjective:   Lee Clark is a 66 y.o. male who presents for Medicare Annual/Subsequent preventive examination.  Review of Systems           Objective:    There were no vitals filed for this visit. There is no height or weight on file to calculate BMI.     09/22/2019    2:45 PM 09/16/2018    1:35 PM 08/07/2016    2:12 PM 11/25/2014    2:35 PM 10/29/2014    2:51 PM  Advanced Directives  Does Patient Have a Medical Advance Directive? No No No No No  Would patient like information on creating a medical advance directive? No - Patient declined No - Guardian declined  Yes - Educational materials given No - patient declined information    Current Medications (verified) Outpatient Encounter Medications as of 02/20/2022  Medication Sig   ipratropium (ATROVENT) 0.06 % nasal spray Place into the nose.   methylPREDNISolone (MEDROL DOSEPAK) 4 MG TBPK tablet    triamcinolone cream (KENALOG) 0.1 % Apply topically.   cholestyramine light (PREVALITE) 4 GM/DOSE powder Take (4 g total) by mouth 4 (four) times daily as needed. Mixed  with water   gentamicin ointment (GARAMYCIN) 0.1 % gentamicin 0.1 % topical ointment   hyoscyamine (LEVBID) 0.375 MG 12 hr tablet Take 1 tablet (0.375 mg total) by mouth 2 (two) times daily.   ipratropium (ATROVENT) 0.03 % nasal spray ipratropium bromide 21 mcg (0.03 %) nasal spray   ipratropium (ATROVENT) 0.06 % nasal spray Place 1 spray into both nostrils in the morning, at noon, and at bedtime.   triamcinolone (NASACORT) 55 MCG/ACT AERO nasal inhaler SMARTSIG:Both Nares   triamcinolone cream (KENALOG) 0.1 % Apply 1 application. topically daily.   No facility-administered encounter medications on file as of 02/20/2022.    Allergies (verified) Ampicillin, Cephalexin, Cephalosporins, and Sulfa antibiotics   History: Past Medical History:  Diagnosis Date   Colon polyp 07/14/2015   History of chicken pox    Paraplegia following spinal cord injury ([place])    partial with minimal movement, sensation from injury at L1 level   Shingles    Stroke [facility])    1970's   Tibial plateau fracture, left 11/26/2014   Past Surgical History:  Procedure Laterality Date   BACK SURGERY     gangrene on bilateral buttoks   BUTTOCK MASS EXCISION     bilateral, due to pressure sore that became gangrene   CHOLECYSTECTOMY     Family History  Problem Relation Age of Onset   Cancer Mother    Diabetes Mother    Hypertension Father    Stroke Father    Lupus Sister  Social History   Socioeconomic History   Marital status: Divorced    Spouse name: Not on file   Number of children: 5   Years of education: Not on file   Highest education level: 9th grade  Occupational History   Not on file  Tobacco Use   Smoking status: Every Day    Packs/day: 0.50    Types: Cigarettes    Start date: 46   Smokeless tobacco: Never   Tobacco comments:    started smoking age  53 yo 1-2 ppd  Vaping Use   Vaping Use: Never used  Substance and Sexual Activity   Alcohol use: No   Drug use: Not Currently    Types:  Marijuana   Sexual activity: Not Currently    Comment: occasionally  Other Topics Concern   Not on file  Social History Narrative   Not on file   Social Determinants of Health   Financial Resource Strain: Low Risk  (02/16/2021)   Overall Financial Resource Strain (CARDIA)    Difficulty of Paying Living Expenses: Not hard at all  Food Insecurity: No Food Insecurity (02/16/2021)   Hunger Vital Sign    Worried About Running Out of Food in the Last Year: Never true    Ran Out of Food in the Last Year: Never true  Transportation Needs: No Transportation Needs (02/16/2021)   PRAPARE - Hydrologist (Medical): No    Lack of Transportation (Non-Medical): No  Physical Activity: Inactive (02/16/2021)   Exercise Vital Sign    Days of Exercise per Week: 0 days    Minutes of Exercise per Session: 0 min  Stress: No Stress Concern Present (02/16/2021)   [place]    Feeling of Stress : Not at all  Social Connections: Moderately Isolated (02/16/2021)   Social Connection and Isolation Panel [NHANES]    Frequency of Communication with Friends and Family: More than three times a week    Frequency of Social Gatherings with Friends and Family: More than three times a week    Attends Religious Services: Never    Marine scientist or Organizations: No    Attends Music therapist: Never    Marital Status: Living with partner    Tobacco Counseling Ready to quit: Not Answered Counseling given: Not Answered Tobacco comments: started smoking age  89 yo 1-2 ppd   Clinical Intake:  Pre-visit preparation completed: No  Pain : No/denies pain     Nutritional Risks: None Diabetes: No  How often do you need to have someone help you when you read instructions, pamphlets, or other written materials from your doctor or pharmacy?: 1 - Never  Diabetic?no  Interpreter Needed?: No  Information  entered by :: Kirke Shaggy, LPN   Activities of Daily Living     No data to display          Patient Care Team: Birdie Sons, MD as PCP - General (Family Medicine) Thornton Park, MD as Consulting Physician (Orthopedic Surgery)  Indicate any recent Medical Services you may have received from other than [facility] providers in the past year (date may be approximate).     Assessment:   This is a routine wellness examination for [facility].  Hearing/Vision screen No results found.  Dietary issues and exercise activities discussed:     Goals Addressed   None    Depression Screen    02/16/2021  5:19 PM 09/22/2019    2:41 PM 12/02/2018    2:19 PM 09/16/2018    1:35 PM 08/13/2017    2:25 PM 08/07/2016    2:10 PM 08/02/2015    2:04 PM  PHQ 2/9 Scores  PHQ - 2 Score 0 0 0 0 0 0 0  PHQ- 9 Score   0  0 0 1    Fall Risk    02/16/2021    5:24 PM 09/22/2019    2:45 PM 09/16/2018    1:35 PM 08/07/2016    2:10 PM 08/02/2015    2:04 PM  Annex in the past year? 0 0 0 No No  Number falls in past yr: 0 0     Injury with Fall? 0 0     Risk for fall due to : Impaired mobility      Follow up Falls evaluation completed        FALL RISK PREVENTION PERTAINING TO THE HOME:  Any stairs in or around the home? No  If so, are there any without handrails? No  Home free of loose throw rugs in walkways, pet beds, electrical cords, etc? Yes  Adequate lighting in your home to reduce risk of falls? Yes   ASSISTIVE DEVICES UTILIZED TO PREVENT FALLS:  Life alert? No  Use of a cane, walker or w/c? Yes  Grab bars in the bathroom? Yes  Shower chair or bench in shower? No  Elevated toilet seat or a handicapped toilet? No    Cognitive Function:declined 2023 PT is A & O x 3        02/16/2021    5:24 PM 09/22/2019    2:52 PM 09/16/2018    1:41 PM  6CIT Screen  What Year? 0 points 0 points 0 points  What month? 0 points 0 points 0 points  What time? 0 points 0 points 0 points   Count back from 20 0 points 0 points 0 points  Months in reverse 0 points 0 points 2 points  Repeat phrase 0 points 0 points 0 points  Total Score 0 points 0 points 2 points    Immunizations  There is no immunization history on file for this patient.  TDAP status: Due, Education has been provided regarding the importance of this vaccine. Advised may receive this vaccine at local pharmacy or Health Dept. Aware to provide a copy of the vaccination record if obtained from local pharmacy or Health Dept. Verbalized acceptance and understanding.  Flu Vaccine status: Declined, Education has been provided regarding the importance of this vaccine but patient still declined. Advised may receive this vaccine at local pharmacy or Health Dept. Aware to provide a copy of the vaccination record if obtained from local pharmacy or Health Dept. Verbalized acceptance and understanding.  Pneumococcal vaccine status: Declined,  Education has been provided regarding the importance of this vaccine but patient still declined. Advised may receive this vaccine at local pharmacy or Health Dept. Aware to provide a copy of the vaccination record if obtained from local pharmacy or Health Dept. Verbalized acceptance and understanding.   Covid-19 vaccine status: Declined, Education has been provided regarding the importance of this vaccine but patient still declined. Advised may receive this vaccine at local pharmacy or Health Dept.or vaccine clinic. Aware to provide a copy of the vaccination record if obtained from local pharmacy or Health Dept. Verbalized acceptance and understanding.  Qualifies for Shingles Vaccine? Yes   Zostavax completed No  Shingrix Completed?: No.    Education has been provided regarding the importance of this vaccine. Patient has been advised to call insurance company to determine out of pocket expense if they have not yet received this vaccine. Advised may also receive vaccine at local pharmacy or  Health Dept. Verbalized acceptance and understanding.  Screening Tests Health Maintenance  Topic Date Due   COVID-19 Vaccine (1) Never done   Pneumonia Vaccine 21+ Years old (1 - PCV) Never done   HIV Screening  Never done   TETANUS/TDAP  Never done   Zoster Vaccines- Shingrix (1 of 2) Never done   COLONOSCOPY (Pts 45-7yrs Insurance coverage will need to be confirmed)  01/09/2008   INFLUENZA VACCINE  Never done   Hepatitis C Screening  Completed   HPV VACCINES  Aged Out    Health Maintenance  Health Maintenance Due  Topic Date Due   COVID-19 Vaccine (1) Never done   Pneumonia Vaccine 81+ Years old (1 - PCV) Never done   HIV Screening  Never done   TETANUS/TDAP  Never done   Zoster Vaccines- Shingrix (1 of 2) Never done   COLONOSCOPY (Pts 45-21yrs Insurance coverage will need to be confirmed)  01/09/2008   INFLUENZA VACCINE  Never done    Declined referral for colonoscopy  Lung Cancer Screening: (Low Dose CT Chest recommended if Age 47-80 years, 30 pack-year currently smoking OR have quit w/in 15years.) does qualify.   Lung Cancer Screening Referral: declined referral  Additional Screening:  Hepatitis C Screening: does qualify; Completed 08/13/17  Vision Screening: Recommended annual ophthalmology exams for early detection of glaucoma and other disorders of the eye. Is the patient up to date with their annual eye exam?  Yes  Who is the provider or what is the name of the office in which the patient attends annual eye exams? Dr.Nice If pt is not established with a provider, would they like to be referred to a provider to establish care? No .   Dental Screening: Recommended annual dental exams for proper oral hygiene  Community Resource Referral / Chronic Care Management: CRR required this visit?  No   CCM required this visit?  No      Plan:     I have personally reviewed and noted the following in the patient's chart:   Medical and social history Use of  alcohol, tobacco or illicit drugs  Current medications and supplements including opioid prescriptions. Patient is not currently taking opioid prescriptions. Functional ability and status Nutritional status Physical activity Advanced directives List of other physicians Hospitalizations, surgeries, and ER visits in previous 12 months Vitals Screenings to include cognitive, depression, and falls Referrals and appointments  In addition, I have reviewed and discussed with patient certain preventive protocols, quality metrics, and best practice recommendations. A written personalized care plan for preventive services as well as general preventive health recommendations were provided to patient.     Dionisio David, LPN   [id number]   Nurse Notes: none

## 2022-02-20 NOTE — Patient Instructions (Signed)
Lee Clark , Thank you for taking time to come for your Medicare Wellness Visit. I appreciate your ongoing commitment to your health goals. Please review the following plan we discussed and let me know if I can assist you in the future.   Screening recommendations/referrals: Colonoscopy: declined referral Recommended yearly ophthalmology/optometry visit for glaucoma screening and checkup Recommended yearly dental visit for hygiene and checkup  Vaccinations: Influenza vaccine: n/d Pneumococcal vaccine: n/d Tdap vaccine: n/d Shingles vaccine: n/d   Covid-19: n/d  Advanced directives: yes  Conditions/risks identified: none  Next appointment: Follow up in one year for your annual wellness visit. 02/22/23 @ 2 pm by phone  Preventive Care 65 Years and Older, Male Preventive care refers to lifestyle choices and visits with your health care provider that can promote health and wellness. What does preventive care include? A yearly physical exam. This is also called an annual well check. Dental exams once or twice a year. Routine eye exams. Ask your health care provider how often you should have your eyes checked. Personal lifestyle choices, including: Daily care of your teeth and gums. Regular physical activity. Eating a healthy diet. Avoiding tobacco and drug use. Limiting alcohol use. Practicing safe sex. Taking low doses of aspirin every day. Taking vitamin and mineral supplements as recommended by your health care provider. What happens during an annual well check? The services and screenings done by your health care provider during your annual well check will depend on your age, overall health, lifestyle risk factors, and family history of disease. Counseling  Your health care provider may ask you questions about your: Alcohol use. Tobacco use. Drug use. Emotional well-being. Home and relationship well-being. Sexual activity. Eating habits. History of falls. Memory and  ability to understand (cognition). Work and work Statistician. Screening  You may have the following tests or measurements: Height, weight, and BMI. Blood pressure. Lipid and cholesterol levels. These may be checked every 5 years, or more frequently if you are over 66 years old. Skin check. Lung cancer screening. You may have this screening every year starting at age 79 if you have a 30-pack-year history of smoking and currently smoke or have quit within the past 15 years. Fecal occult blood test (FOBT) of the stool. You may have this test every year starting at age 98. Flexible sigmoidoscopy or colonoscopy. You may have a sigmoidoscopy every 5 years or a colonoscopy every 10 years starting at age 12. Prostate cancer screening. Recommendations will vary depending on your family history and other risks. Hepatitis C blood test. Hepatitis B blood test. Sexually transmitted disease (STD) testing. Diabetes screening. This is done by checking your blood sugar (glucose) after you have not eaten for a while (fasting). You may have this done every 1-3 years. Abdominal aortic aneurysm (AAA) screening. You may need this if you are a current or former smoker. Osteoporosis. You may be screened starting at age 75 if you are at high risk. Talk with your health care provider about your test results, treatment options, and if necessary, the need for more tests. Vaccines  Your health care provider may recommend certain vaccines, such as: Influenza vaccine. This is recommended every year. Tetanus, diphtheria, and acellular pertussis (Tdap, Td) vaccine. You may need a Td booster every 10 years. Zoster vaccine. You may need this after age 51. Pneumococcal 13-valent conjugate (PCV13) vaccine. One dose is recommended after age 51. Pneumococcal polysaccharide (PPSV23) vaccine. One dose is recommended after age 29. Talk to your health care provider  about which screenings and vaccines you need and how often you need  them. This information is not intended to replace advice given to you by your health care provider. Make sure you discuss any questions you have with your health care provider. Document Released: 06/04/2015 Document Revised: 01/26/2016 Document Reviewed: 03/09/2015 Elsevier Interactive Patient Education  2017 [place] Prevention in the Home Falls can cause injuries. They can happen to people of all ages. There are many things you can do to make your home safe and to help prevent falls. What can I do on the outside of my home? Regularly fix the edges of walkways and driveways and fix any cracks. Remove anything that might make you trip as you walk through a door, such as a raised step or threshold. Trim any bushes or trees on the path to your home. Use bright outdoor lighting. Clear any walking paths of anything that might make someone trip, such as rocks or tools. Regularly check to see if handrails are loose or broken. Make sure that both sides of any steps have handrails. Any raised decks and porches should have guardrails on the edges. Have any leaves, snow, or ice cleared regularly. Use sand or salt on walking paths during winter. Clean up any spills in your garage right away. This includes oil or grease spills. What can I do in the bathroom? Use night lights. Install grab bars by the toilet and in the tub and shower. Do not use towel bars as grab bars. Use non-skid mats or decals in the tub or shower. If you need to sit down in the shower, use a plastic, non-slip stool. Keep the floor dry. Clean up any water that spills on the floor as soon as it happens. Remove soap buildup in the tub or shower regularly. Attach bath mats securely with double-sided non-slip rug tape. Do not have throw rugs and other things on the floor that can make you trip. What can I do in the bedroom? Use night lights. Make sure that you have a light by your bed that is easy to reach. Do not use  any sheets or blankets that are too big for your bed. They should not hang down onto the floor. Have a firm chair that has side arms. You can use this for support while you get dressed. Do not have throw rugs and other things on the floor that can make you trip. What can I do in the kitchen? Clean up any spills right away. Avoid walking on wet floors. Keep items that you use a lot in easy-to-reach places. If you need to reach something above you, use a strong step stool that has a grab bar. Keep electrical cords out of the way. Do not use floor polish or wax that makes floors slippery. If you must use wax, use non-skid floor wax. Do not have throw rugs and other things on the floor that can make you trip. What can I do with my stairs? Do not leave any items on the stairs. Make sure that there are handrails on both sides of the stairs and use them. Fix handrails that are broken or loose. Make sure that handrails are as long as the stairways. Check any carpeting to make sure that it is firmly attached to the stairs. Fix any carpet that is loose or worn. Avoid having throw rugs at the top or bottom of the stairs. If you do have throw rugs, attach them to the floor  with carpet tape. Make sure that you have a light switch at the top of the stairs and the bottom of the stairs. If you do not have them, ask someone to add them for you. What else can I do to help prevent falls? Wear shoes that: Do not have high heels. Have rubber bottoms. Are comfortable and fit you well. Are closed at the toe. Do not wear sandals. If you use a stepladder: Make sure that it is fully opened. Do not climb a closed stepladder. Make sure that both sides of the stepladder are locked into place. Ask someone to hold it for you, if possible. Clearly mark and make sure that you can see: Any grab bars or handrails. First and last steps. Where the edge of each step is. Use tools that help you move around (mobility aids)  if they are needed. These include: Canes. Walkers. Scooters. Crutches. Turn on the lights when you go into a dark area. Replace any light bulbs as soon as they burn out. Set up your furniture so you have a clear path. Avoid moving your furniture around. If any of your floors are uneven, fix them. If there are any pets around you, be aware of where they are. Review your medicines with your doctor. Some medicines can make you feel dizzy. This can increase your chance of falling. Ask your doctor what other things that you can do to help prevent falls. This information is not intended to replace advice given to you by your health care provider. Make sure you discuss any questions you have with your health care provider. Document Released: 03/04/2009 Document Revised: 10/14/2015 Document Reviewed: 06/12/2014 Elsevier Interactive Patient Education  2017 [employer].

## 2022-05-23 DIAGNOSIS — Z882 Allergy status to sulfonamides status: Secondary | ICD-10-CM | POA: Diagnosis not present

## 2022-05-23 DIAGNOSIS — G8220 Paraplegia, unspecified: Secondary | ICD-10-CM | POA: Diagnosis not present

## 2022-05-23 DIAGNOSIS — Z79899 Other long term (current) drug therapy: Secondary | ICD-10-CM | POA: Diagnosis not present

## 2022-05-23 DIAGNOSIS — F17200 Nicotine dependence, unspecified, uncomplicated: Secondary | ICD-10-CM | POA: Diagnosis not present

## 2022-05-23 DIAGNOSIS — T1490XS Injury, unspecified, sequela: Secondary | ICD-10-CM | POA: Diagnosis not present

## 2022-05-23 DIAGNOSIS — Z880 Allergy status to penicillin: Secondary | ICD-10-CM | POA: Diagnosis not present

## 2022-05-23 DIAGNOSIS — H2511 Age-related nuclear cataract, right eye: Secondary | ICD-10-CM | POA: Diagnosis not present

## 2022-05-23 DIAGNOSIS — K089 Disorder of teeth and supporting structures, unspecified: Secondary | ICD-10-CM | POA: Diagnosis not present

## 2022-05-23 DIAGNOSIS — H2513 Age-related nuclear cataract, bilateral: Secondary | ICD-10-CM | POA: Diagnosis not present

## 2022-05-23 DIAGNOSIS — Z7951 Long term (current) use of inhaled steroids: Secondary | ICD-10-CM | POA: Diagnosis not present

## 2022-05-23 HISTORY — PX: CATARACT EXTRACTION: SUR2

## 2022-05-24 ENCOUNTER — Encounter: Payer: Self-pay | Admitting: Family Medicine

## 2022-06-05 DIAGNOSIS — S93411A Sprain of calcaneofibular ligament of right ankle, initial encounter: Secondary | ICD-10-CM | POA: Diagnosis not present

## 2022-06-13 DIAGNOSIS — H2512 Age-related nuclear cataract, left eye: Secondary | ICD-10-CM | POA: Diagnosis not present

## 2022-06-13 DIAGNOSIS — H2513 Age-related nuclear cataract, bilateral: Secondary | ICD-10-CM | POA: Diagnosis not present

## 2022-06-13 DIAGNOSIS — Z880 Allergy status to penicillin: Secondary | ICD-10-CM | POA: Diagnosis not present

## 2022-06-13 DIAGNOSIS — M1990 Unspecified osteoarthritis, unspecified site: Secondary | ICD-10-CM | POA: Diagnosis not present

## 2022-06-13 DIAGNOSIS — Z882 Allergy status to sulfonamides status: Secondary | ICD-10-CM | POA: Diagnosis not present

## 2022-06-13 DIAGNOSIS — I4510 Unspecified right bundle-branch block: Secondary | ICD-10-CM | POA: Diagnosis not present

## 2022-06-13 DIAGNOSIS — F17210 Nicotine dependence, cigarettes, uncomplicated: Secondary | ICD-10-CM | POA: Diagnosis not present

## 2022-06-13 DIAGNOSIS — H2511 Age-related nuclear cataract, right eye: Secondary | ICD-10-CM | POA: Diagnosis not present

## 2022-06-13 DIAGNOSIS — Z881 Allergy status to other antibiotic agents status: Secondary | ICD-10-CM | POA: Diagnosis not present

## 2022-06-15 ENCOUNTER — Other Ambulatory Visit: Payer: Self-pay | Admitting: Family Medicine

## 2022-06-15 DIAGNOSIS — K580 Irritable bowel syndrome with diarrhea: Secondary | ICD-10-CM

## 2022-06-15 DIAGNOSIS — K529 Noninfective gastroenteritis and colitis, unspecified: Secondary | ICD-10-CM

## 2022-06-15 NOTE — Telephone Encounter (Signed)
Requested medication (s) are due for refill today: yes  Requested medication (s) are on the active medication list: yes  Last refill:  02/06/22 #462g/4  Future visit scheduled:no  Notes to clinic:  Unable to refill per protocol due to failed labs, no updated results.    Requested Prescriptions  Pending Prescriptions Disp Refills   cholestyramine light (PREVALITE) 4 GM/DOSE powder [Pharmacy Med Name: CHOLESTYRAMINE LIGHT POWDER] 462 g 0    Sig: Take (4 g total) by mouth 4 (four) times daily as needed. Mixed with water     Cardiovascular:  Antilipid - Bile Acid Sequestrants Failed - 06/15/2022  9:30 AM      Failed - Lipid Panel in normal range within the last 12 months    Cholesterol, Total  Date Value Ref Range Status  08/13/2017 159 100 - 199 mg/dL Final   LDL Calculated  Date Value Ref Range Status  08/13/2017 74 0 - 99 mg/dL Final   HDL  Date Value Ref Range Status  08/13/2017 73 >39 mg/dL Final   Triglycerides  Date Value Ref Range Status  08/13/2017 61 0 - 149 mg/dL Final         Passed - Valid encounter within last 12 months    Recent Outpatient Visits           5 months ago Paraplegia [facility])   [place] Birdie Sons, MD   1 year ago Encounter for Medicare annual wellness exam   [facility] Birdie Sons, MD   1 year ago Olecranon bursitis of left elbow   [place], Donald E, MD   2 years ago Irritable bowel syndrome with diarrhea   [place], Donald E, MD   2 years ago Suspected COVID-19 virus infection   [place], [place], [place]

## 2022-06-18 DIAGNOSIS — R000 Tachycardia, unspecified: Secondary | ICD-10-CM | POA: Diagnosis not present

## 2022-09-18 DIAGNOSIS — Z961 Presence of intraocular lens: Secondary | ICD-10-CM | POA: Diagnosis not present

## 2022-09-18 DIAGNOSIS — H52223 Regular astigmatism, bilateral: Secondary | ICD-10-CM | POA: Diagnosis not present

## 2022-09-18 DIAGNOSIS — H5203 Hypermetropia, bilateral: Secondary | ICD-10-CM | POA: Diagnosis not present

## 2022-09-18 DIAGNOSIS — H524 Presbyopia: Secondary | ICD-10-CM | POA: Diagnosis not present

## 2022-10-14 ENCOUNTER — Other Ambulatory Visit: Payer: Self-pay | Admitting: Family Medicine

## 2022-10-14 DIAGNOSIS — K580 Irritable bowel syndrome with diarrhea: Secondary | ICD-10-CM

## 2022-10-14 DIAGNOSIS — K529 Noninfective gastroenteritis and colitis, unspecified: Secondary | ICD-10-CM

## 2022-10-17 NOTE — Telephone Encounter (Signed)
Requested medication (s) are due for refill today - yes  Requested medication (s) are on the active medication list -yes  Future visit scheduled -no  Last refill: 06/17/22 462g 4RF  Notes to clinic: Fails lab protocol- over 1 year-2019  Requested Prescriptions  Pending Prescriptions Disp Refills   cholestyramine light (PREVALITE) 4 GM/DOSE powder [Pharmacy Med Name: CHOLESTYRAMINE LIGHT POWDER] 462 g 0    Sig: Take (4 g total) by mouth 4 (four) times daily as needed. Mixed with water     Cardiovascular:  Antilipid - Bile Acid Sequestrants Failed - 10/14/2022  9:04 AM      Failed - Lipid Panel in normal range within the last 12 months    Cholesterol, Total  Date Value Ref Range Status  08/13/2017 159 100 - 199 mg/dL Final   LDL Calculated  Date Value Ref Range Status  08/13/2017 74 0 - 99 mg/dL Final   HDL  Date Value Ref Range Status  08/13/2017 73 >39 mg/dL Final   Triglycerides  Date Value Ref Range Status  08/13/2017 61 0 - 149 mg/dL Final         Passed - Valid encounter within last 12 months    Recent Outpatient Visits           9 months ago Paraplegia [facility])   [place] [facility] Malva Limes, MD   1 year ago Encounter for Medicare annual wellness exam   [facility] Malva Limes, MD   1 year ago Olecranon bursitis of left elbow   [place] [facility] Malva Limes, MD   2 years ago Irritable bowel syndrome with diarrhea   [facility] Malva Limes, MD   3 years ago Suspected COVID-19 virus infection   [place] [facility] [place], [place], [place]                 Requested Prescriptions  Pending Prescriptions Disp Refills   cholestyramine light (PREVALITE) 4 GM/DOSE powder [Pharmacy Med Name: CHOLESTYRAMINE LIGHT POWDER] 462 g 0    Sig: Take (4 g total) by mouth 4 (four) times daily as needed. Mixed with water     Cardiovascular:   Antilipid - Bile Acid Sequestrants Failed - 10/14/2022  9:04 AM      Failed - Lipid Panel in normal range within the last 12 months    Cholesterol, Total  Date Value Ref Range Status  08/13/2017 159 100 - 199 mg/dL Final   LDL Calculated  Date Value Ref Range Status  08/13/2017 74 0 - 99 mg/dL Final   HDL  Date Value Ref Range Status  08/13/2017 73 >39 mg/dL Final   Triglycerides  Date Value Ref Range Status  08/13/2017 61 0 - 149 mg/dL Final         Passed - Valid encounter within last 12 months    Recent Outpatient Visits           9 months ago Paraplegia [facility])   [place] [facility] Malva Limes, MD   1 year ago Encounter for Medicare annual wellness exam   [facility] Malva Limes, MD   1 year ago Olecranon bursitis of left elbow   [facility] Malva Limes, MD   2 years ago Irritable bowel syndrome with diarrhea   [facility] Malva Limes, MD   3 years  ago Suspected COVID-19 virus infection   [facility] [place], Ricki Rodriguez [place], [place]

## 2022-11-07 ENCOUNTER — Other Ambulatory Visit: Payer: Self-pay | Admitting: Family Medicine

## 2022-11-07 DIAGNOSIS — K529 Noninfective gastroenteritis and colitis, unspecified: Secondary | ICD-10-CM

## 2022-11-07 DIAGNOSIS — K580 Irritable bowel syndrome with diarrhea: Secondary | ICD-10-CM

## 2023-01-01 ENCOUNTER — Telehealth: Payer: Self-pay | Admitting: *Deleted

## 2023-01-01 NOTE — Progress Notes (Unsigned)
  Care Coordination  Outreach Note  01/01/2023 Name: Attila Cus[facility]ano [facility] MRN: [id number] DOB: February 22, 1956   Care Coordination Outreach Attempts: An unsuccessful telephone outreach was attempted today to offer the patient information about available care coordination services.  Follow Up Plan:  Additional outreach attempts will be made to offer the patient care coordination information and services.   Encounter Outcome:  No Answer  Burman Nieves, CCMA Care Coordination Care Guide Direct Dial: [phone]

## 2023-01-02 NOTE — Progress Notes (Signed)
  Care Coordination   Note   01/02/2023 Name: Lee Clark [facility] MRN: [id number] DOB: 1955/06/20  Lee Clark is a 67 y.o. year old male who sees Fisher, Demetrios Isaacs, MD for primary care. I reached out to [employer] by phone today to offer care coordination services.  [name] was given information about Care Coordination services today including:   The Care Coordination services include support from the care team which includes your Nurse Coordinator, Clinical Social Worker, or Pharmacist.  The Care Coordination team is here to help remove barriers to the health concerns and goals most important to you. Care Coordination services are voluntary, and the patient may decline or stop services at any time by request to their care team member.   Care Coordination Consent Status: Patient did not agree to participate in care coordination services at this time.  Follow up plan:  pt says he received transportation services already from [employer].   Encounter Outcome:  Pt. Refused  Burman Nieves, CCMA Care Coordination Care Guide Direct Dial: [phone]

## 2023-01-09 ENCOUNTER — Ambulatory Visit: Payer: Self-pay | Admitting: *Deleted

## 2023-01-09 NOTE — Telephone Encounter (Signed)
Summary: diarrhea   Per agent:   "Pt called in states has diarrhea for months, no appetite.and mouth clammy. He is asking for med to be sent to pharmacy. [facility] - [place], [place] - [address] Phone: [phone] Fax: [phone]"          Chief Complaint: Loose stools Symptoms: Loose stools 1-2 daily, incontinence at times.States stools are watery at times. Unintentional weight loss, Unsure how much "I can just tell." No appetite, mouth "Clammy, hard to describe."  Frequency: 2-3 months Pertinent Negatives: Patient denies vomiting, abdominal pain Disposition: [] ED /[] Urgent Care (no appt availability in office) / [x] Appointment(In office/virtual)/ []  [place] [facility]/ [] Home Care/ [] Refused Recommended Disposition /[] [place] [facility]/ []  Follow-up with PCP Additional Notes: Pt has appt Monday 01/15/23 for physical.  Requesting something be called in for loose stools. States afraid to go anywhere due to incontinent episodes. Assured pt NT would route to practice for PCPs review and final disposition. States is staying hydrated. Care advise provided, pt verbalizes understanding.  Please advise.  Reason for Disposition  Diarrhea is a chronic symptom (recurrent or ongoing AND present > 4 weeks)  Answer Assessment - Initial Assessment Questions 1. DIARRHEA SEVERITY: "How bad is the diarrhea?" "How many more stools have you had in the past 24 hours than normal?"    - NO DIARRHEA (SCALE 0)   - MILD (SCALE 1-3): Few loose or mushy BMs; increase of 1-3 stools over normal daily number of stools; mild increase in ostomy output.   -  MODERATE (SCALE 4-7): Increase of 4-6 stools daily over normal; moderate increase in ostomy output.   -  SEVERE (SCALE 8-10; OR "WORST POSSIBLE"): Increase of 7 or more stools daily over normal; moderate increase in ostomy output; incontinence.     1-2 2. ONSET: "When did the diarrhea begin?"      2-3.months 3. BM CONSISTENCY: "How loose or  watery is the diarrhea?"      Watery at times, mucous at times 4. VOMITING: "Are you also vomiting?" If Yes, ask: "How many times in the past 24 hours?"      No 5. ABDOMEN PAIN: "Are you having any abdomen pain?" If Yes, ask: "What does it feel like?" (e.g., crampy, dull, intermittent, constant)      No 6. ABDOMEN PAIN SEVERITY: If present, ask: "How bad is the pain?"  (e.g., Scale 1-10; mild, moderate, or severe)   - MILD (1-3): doesn't interfere with normal activities, abdomen soft and not tender to touch    - MODERATE (4-7): interferes with normal activities or awakens from sleep, abdomen tender to touch    - SEVERE (8-10): excruciating pain, doubled over, unable to do any normal activities       NA 7. ORAL INTAKE: If vomiting, "Have you been able to drink liquids?" "How much liquids have you had in the past 24 hours?"     Staying hydrated 8. HYDRATION: "Any signs of dehydration?" (e.g., dry mouth [not just dry lips], too weak to stand, dizziness, new weight loss) "When did you last urinate?"     No 9. EXPOSURE: "Have you traveled to a foreign country recently?" "Have you been exposed to anyone with diarrhea?" "Could you have eaten any food that was spoiled?"     no 10. ANTIBIOTIC USE: "Are you taking antibiotics now or have you taken antibiotics in the past 2 months?"       no 11. OTHER SYMPTOMS: "Do you  have any other symptoms?" (e.g., fever, blood in stool)       Unintentional weight loss, no appetite, mouth dry, clammy at times  Protocols used: [facility]

## 2023-01-10 MED ORDER — DIPHENOXYLATE-ATROPINE 2.5-0.025 MG PO TABS: 1.0000 | ORAL_TABLET | Freq: Four times a day (QID) | ORAL | 0 refills | Status: DC | PRN

## 2023-01-10 NOTE — Telephone Encounter (Signed)
Patient advised.

## 2023-01-10 NOTE — Telephone Encounter (Signed)
Have sent prescription for lomotil to his pharmacy.

## 2023-01-15 ENCOUNTER — Encounter: Payer: Self-pay | Admitting: Family Medicine

## 2023-01-15 ENCOUNTER — Ambulatory Visit
Admission: RE | Admit: 2023-01-15 | Discharge: 2023-01-15 | Disposition: A | Discharge status: Home / Self Care | Payer: Medicare Other | Source: Ambulatory Visit | Attending: Family Medicine | Admitting: Family Medicine

## 2023-01-15 ENCOUNTER — Ambulatory Visit (INDEPENDENT_AMBULATORY_CARE_PROVIDER_SITE_OTHER): Payer: Medicare Other | Admitting: Family Medicine

## 2023-01-15 VITALS — BP 112/72 | HR 91

## 2023-01-15 DIAGNOSIS — R197 Diarrhea, unspecified: Secondary | ICD-10-CM

## 2023-01-15 DIAGNOSIS — I77810 Thoracic aortic ectasia: Secondary | ICD-10-CM | POA: Diagnosis not present

## 2023-01-15 DIAGNOSIS — K529 Noninfective gastroenteritis and colitis, unspecified: Secondary | ICD-10-CM | POA: Diagnosis not present

## 2023-01-15 DIAGNOSIS — Z0000 Encounter for general adult medical examination without abnormal findings: Secondary | ICD-10-CM

## 2023-01-15 DIAGNOSIS — Z136 Encounter for screening for cardiovascular disorders: Secondary | ICD-10-CM | POA: Diagnosis not present

## 2023-01-15 DIAGNOSIS — R918 Other nonspecific abnormal finding of lung field: Secondary | ICD-10-CM | POA: Diagnosis not present

## 2023-01-15 DIAGNOSIS — K219 Gastro-esophageal reflux disease without esophagitis: Secondary | ICD-10-CM | POA: Diagnosis not present

## 2023-01-15 DIAGNOSIS — R059 Cough, unspecified: Secondary | ICD-10-CM | POA: Diagnosis not present

## 2023-01-15 DIAGNOSIS — Z0001 Encounter for general adult medical examination with abnormal findings: Secondary | ICD-10-CM | POA: Diagnosis not present

## 2023-01-15 DIAGNOSIS — R300 Dysuria: Secondary | ICD-10-CM

## 2023-01-15 DIAGNOSIS — R5383 Other fatigue: Secondary | ICD-10-CM | POA: Diagnosis not present

## 2023-01-15 DIAGNOSIS — E559 Vitamin D deficiency, unspecified: Secondary | ICD-10-CM

## 2023-01-15 DIAGNOSIS — K580 Irritable bowel syndrome with diarrhea: Secondary | ICD-10-CM | POA: Diagnosis not present

## 2023-01-15 DIAGNOSIS — R739 Hyperglycemia, unspecified: Secondary | ICD-10-CM

## 2023-01-15 DIAGNOSIS — Z125 Encounter for screening for malignant neoplasm of prostate: Secondary | ICD-10-CM | POA: Diagnosis not present

## 2023-01-15 DIAGNOSIS — R058 Other specified cough: Secondary | ICD-10-CM | POA: Diagnosis not present

## 2023-01-15 DIAGNOSIS — G8220 Paraplegia, unspecified: Secondary | ICD-10-CM

## 2023-01-15 DIAGNOSIS — R079 Chest pain, unspecified: Secondary | ICD-10-CM | POA: Diagnosis not present

## 2023-01-15 MED ORDER — CHOLESTYRAMINE LIGHT 4 GM/DOSE PO POWD
ORAL | 3 refills | Status: DC
Start: 2023-01-15 — End: 2023-05-07

## 2023-01-15 MED ORDER — AZITHROMYCIN 250 MG PO TABS
ORAL_TABLET | ORAL | 0 refills | Status: AC
Start: 2023-01-15 — End: 2023-01-20

## 2023-01-15 MED ORDER — HYOSCYAMINE SULFATE ER 0.375 MG PO TB12
0.3750 mg | ORAL_TABLET | Freq: Two times a day (BID) | ORAL | 5 refills | Status: DC
Start: 2023-01-15 — End: 2024-01-23

## 2023-01-15 NOTE — Patient Instructions (Signed)
Please review the attached list of medications and notify my office if there are any errors.   Go to the [facility] on [facility] for [employer]

## 2023-01-16 LAB — COMPREHENSIVE METABOLIC PANEL
ALT: 10 IU/L (ref 0–44)
AST: 29 IU/L (ref 0–40)
Albumin: 3.1 g/dL — ABNORMAL LOW (ref 3.9–4.9)
Alkaline Phosphatase: 178 IU/L — ABNORMAL HIGH (ref 44–121)
BUN/Creatinine Ratio: 25 — ABNORMAL HIGH (ref 10–24)
BUN: 17 mg/dL (ref 8–27)
Bilirubin Total: 0.8 mg/dL (ref 0.0–1.2)
CO2: 14 mmol/L — ABNORMAL LOW (ref 20–29)
Calcium: 8.3 mg/dL — ABNORMAL LOW (ref 8.6–10.2)
Chloride: 106 mmol/L (ref 96–106)
Creatinine, Ser: 0.67 mg/dL — ABNORMAL LOW (ref 0.76–1.27)
Globulin, Total: 3.2 g/dL (ref 1.5–4.5)
Glucose: 79 mg/dL (ref 70–99)
Potassium: 3.7 mmol/L (ref 3.5–5.2)
Sodium: 140 mmol/L (ref 134–144)
Total Protein: 6.3 g/dL (ref 6.0–8.5)
eGFR: 103 mL/min/{1.73_m2} (ref >59)

## 2023-01-16 LAB — HEMOGLOBIN A1C
Est. average glucose Bld gHb Est-mCnc: 114 mg/dL
Hgb A1c MFr Bld: 5.6 % (ref 4.8–5.6)

## 2023-01-16 LAB — LIPID PANEL
Chol/HDL Ratio: 2.6 ratio (ref 0.0–5.0)
Cholesterol, Total: 107 mg/dL (ref 100–199)
HDL: 41 mg/dL (ref >39)
LDL Chol Calc (NIH): 48 mg/dL (ref 0–99)
Triglycerides: 96 mg/dL (ref 0–149)
VLDL Cholesterol Cal: 18 mg/dL (ref 5–40)

## 2023-01-16 LAB — VITAMIN D 25 HYDROXY (VIT D DEFICIENCY, FRACTURES): Vit D, 25-Hydroxy: 4.7 ng/mL — ABNORMAL LOW (ref 30.0–100.0)

## 2023-01-16 LAB — CBC
Hematocrit: 42.6 % (ref 37.5–51.0)
Hemoglobin: 14.3 g/dL (ref 13.0–17.7)
MCH: 28.8 pg (ref 26.6–33.0)
MCHC: 33.6 g/dL (ref 31.5–35.7)
MCV: 86 fL (ref 79–97)
Platelets: 206 10*3/uL (ref 150–450)
RBC: 4.97 x10E6/uL (ref 4.14–5.80)
RDW: 13.2 % (ref 11.6–15.4)
WBC: 9.6 10*3/uL (ref 3.4–10.8)

## 2023-01-16 LAB — TSH: TSH: 0.917 u[IU]/mL (ref 0.450–4.500)

## 2023-01-16 LAB — PSA: Prostate Specific Ag, Serum: 1.2 ng/mL (ref 0.0–4.0)

## 2023-01-16 LAB — VITAMIN B12: Vitamin B-12: 1109 pg/mL (ref 232–1245)

## 2023-01-17 ENCOUNTER — Telehealth: Payer: Self-pay

## 2023-01-17 DIAGNOSIS — R197 Diarrhea, unspecified: Secondary | ICD-10-CM | POA: Diagnosis not present

## 2023-01-17 DIAGNOSIS — R300 Dysuria: Secondary | ICD-10-CM | POA: Diagnosis not present

## 2023-01-17 NOTE — Telephone Encounter (Signed)
Copied from CRM [id number]. Topic: General - Inquiry >> Jan 17, 2023 10:48 AM Payton Doughty wrote: Reason for CRM: pt wants to know what to do with the specimens that Dr Sherrie Mustache had him do.  Pt has 8 stool samples and one urine.  He would like to know where to bring these?  And what should he do now?  One is supposed to be frozen, one refrigerate, the rest room temperature. Please call back.  Patient advised to bring samples to our office/lab.

## 2023-01-18 ENCOUNTER — Telehealth: Payer: Self-pay

## 2023-01-18 LAB — URINALYSIS
Bilirubin, UA: NEGATIVE
Glucose, UA: NEGATIVE
Nitrite, UA: NEGATIVE
RBC, UA: NEGATIVE
Specific Gravity, UA: 1.026 (ref 1.005–1.030)
Urobilinogen, Ur: 0.2 mg/dL (ref 0.2–1.0)
pH, UA: 6 (ref 5.0–7.5)

## 2023-01-18 NOTE — Telephone Encounter (Signed)
Copied from CRM [id number]. Topic: General - Inquiry >> Jan 18, 2023  8:19 AM Patsy Lager T wrote: Reason for CRM: patient stated he recvd a call to have xrays done in 3 weeks but he does not know where he needs to go. Please f/u with patient

## 2023-01-19 NOTE — Progress Notes (Signed)
Annual Wellness Visit     Patient: Lee Clark, Male    DOB: [birth year], 66 y.o.   MRN: [id number] Visit Date: 01/15/2023  Today's Provider: [name], MD   Chief Complaint  Patient presents with   Medicare Wellness   Subjective    Lee Clark [facility] is a 67 y.o. male who presents today for his Annual Wellness Visit.   Patient also reports a several-month history of worsening diarrhea, described as watery with mucus and occasionally black in color. He does have chronic diarrhea that is usually controlled with cholestyramine. The patient reports associated symptoms of dry mouth and increased urinary incontinence. He also describes a persistent cough productive of yellow sputum, and yellow nasal discharge. The patient has experienced significant weight loss and has been unable to eat for the past few weeks due to dysphagia, with food getting stuck in the throat. He also reports a change in the taste of water and occasional pain around the mouth.  The patient has been experiencing increased stress due to recent family tragedies, including the overdose deaths of a daughter and a grandson. He also reports occasional slight abdominal pain and pain on deep breathing. The patient has not been taking any medications for heartburn or acid reflux, and has not tried any over-the-counter remedies for the diarrhea. He has not taken any antibiotics recently.  The patient has been experiencing wheezing and reports soreness in the abdomen on examination. He has been losing weight and is concerned about the cause of the persistent diarrhea, which is a significant change from his usual bowel habits. He is also concerned about the change in his voice and the taste of water. The patient is seeking answers and relief from his symptoms.         Medications: Outpatient Medications Prior to Visit  Medication Sig   diphenoxylate-atropine (LOMOTIL) 2.5-0.025 MG tablet Take 1-2 tablets by mouth 4  (four) times daily as needed for diarrhea or loose stools.   gentamicin ointment (GARAMYCIN) 0.1 %    ipratropium (ATROVENT) 0.06 % nasal spray Place into the nose.   methylPREDNISolone (MEDROL DOSEPAK) 4 MG TBPK tablet    triamcinolone (NASACORT) 55 MCG/ACT AERO nasal inhaler    triamcinolone cream (KENALOG) 0.1 % Apply 1 application. topically daily.   [DISCONTINUED] cholestyramine light (PREVALITE) 4 GM/DOSE powder Take (4 g total) by mouth 4 (four) times daily as needed. Mixed with water   [DISCONTINUED] hyoscyamine (LEVBID) 0.375 MG 12 hr tablet Take 1 tablet (0.375 mg total) by mouth 2 (two) times daily.   ipratropium (ATROVENT) 0.03 % nasal spray ipratropium bromide 21 mcg (0.03 %) nasal spray (Patient not taking: Reported on 02/20/2022)   No facility-administered medications prior to visit.    Allergies  Allergen Reactions   Ampicillin Hives   Cephalexin     Other reaction(s): Not available   Cephalosporins Other (See Comments)    Pt cannot remember    Sulfa Antibiotics Diarrhea    Patient Care Team: Malva Limes, MD as PCP - General (Family Medicine) Juanell Fairly, MD as Consulting Physician (Orthopedic Surgery)      Objective    Vitals: BP 112/72 (BP Location: Right Arm, Patient Position: Sitting, Cuff Size: Normal)   Pulse 91    Physical Exam  General: Appearance:    Well developed, well nourished male in no acute distress. Wheelchair bound due to paraplegia.   Eyes:    PERRL, conjunctiva/corneas clear, EOM's intact  Lungs:     Clear to auscultation bilaterally, respirations unlabored. Diffuse wheezing with rales in LLL  Heart:    Normal heart rate. Normal rhythm. No murmurs, rubs, or gallops.    MS:   All extremities are intact.    Neurologic:   Awake, alert, oriented x 3. No apparent focal neurological defect.         Most recent functional status assessment:    02/20/2022    3:37 PM  In your present state of health, do you have any difficulty  performing the following activities:  Hearing? 0  Vision? 0  Difficulty concentrating or making decisions? 0  Walking or climbing stairs? 1  Dressing or bathing? 0  Doing errands, shopping? 1  Preparing Food and eating ? N  Using the Toilet? N  In the past six months, have you accidently leaked urine? N  Do you have problems with loss of bowel control? N  Managing your Medications? N  Managing your Finances? N  Housekeeping or managing your Housekeeping? Y   Most recent fall risk assessment:    01/15/2023    1:46 PM  Fall Risk   Falls in the past year? 0  Injury with Fall? 0  Risk for fall due to : No Fall Risks  Follow up Falls evaluation completed    Most recent depression screenings:    01/15/2023    1:46 PM 02/20/2022    3:35 PM  PHQ 2/9 Scores  PHQ - 2 Score 6 0  PHQ- 9 Score 17 0   Most recent cognitive screening:    02/16/2021    5:24 PM  6CIT Screen  What Year? 0 points  What month? 0 points  What time? 0 points  Count back from 20 0 points  Months in reverse 0 points  Repeat phrase 0 points  Total Score 0 points   Most recent Audit-C alcohol use screening    02/20/2022    3:34 PM  Alcohol Use Disorder Test (AUDIT)  1. How often do you have a drink containing alcohol? 0  2. How many drinks containing alcohol do you have on a typical day when you are drinking? 0  3. How often do you have six or more drinks on one occasion? 0  AUDIT-C Score 0   A score of 3 or more in women, and 4 or more in men indicates increased risk for alcohol abuse, EXCEPT if all of the points are from question 1     Assessment & Plan     Annual wellness visit done today including the all of the following: Reviewed patient's Family Medical History Reviewed and updated list of patient's medical providers Assessment of cognitive impairment was done Assessed patient's functional ability Established a written schedule for health screening services Health Risk Assessent Completed  and Reviewed  Exercise Activities and Dietary recommendations  Goals      DIET - DECREASE SODA OR JUICE INTAKE     Recommend to decrease soda intake to 1 a day and increase water intake to 6-8 8 oz glasses a day.       DIET - EAT MORE FRUITS AND VEGETABLES         There is no immunization history on file for this patient.  Health Maintenance  Topic Date Due   COVID-19 Vaccine (1) Never done   Pneumonia Vaccine 45+ Years old (1 of 2 - PCV) Never done   DTaP/Tdap/Td (1 - Tdap) Never done   Zoster  Vaccines- Shingrix (1 of 2) Never done   Lung Cancer Screening  Never done   Colonoscopy  01/09/2008   INFLUENZA VACCINE  12/21/2022   Medicare Annual Wellness (AWV)  02/21/2023   Hepatitis C Screening  Completed   HPV VACCINES  Aged Out     Discussed health benefits of physical activity, and encouraged him to engage in regular exercise appropriate for his age and condition.    Check PSA for prostate cancer screening Check vitamin D and b26for known vitamin D and b12 deficiency  Check lipids, A1c,and met C for history hyperglycemia and dyslipidemia.  Wheelchair bound due to paraplegia     Chronic Diarrhea Persistent diarrhea with mucus and black stools. No recent antibiotic use. Patient has not tried any over-the-counter remedies like Imodium. -Order stool studies to evaluate for infectious causes and malabsorption. -Check electrolytes, thyroid function, and glucose levels. -Advise patient to try Imodium for symptomatic relief.  Dysphagia Difficulty swallowing with sensation of food getting stuck in throat. Recent weight loss due to decreased oral intake. -Consider referral for swallow study or gastroenterology consult if symptoms persist.  LRI Persistent cough with yellow sputum production. Wheezing noted on exam. -Order chest x-ray. -Prescribe Z-pack for suspected LRI.  Urinary Incontinence Increased frequency of urinary incontinence. -Given sample container to bring  back sample for u/a   Acid Reflux Complaints of acid reflux symptoms. -Consider trial of over-the-counter antacid or proton pump inhibitor.  Psychosocial Stress Recent loss of daughter and grandson, causing significant emotional distress. -Consider referral to mental health services for grief counseling.           [name], MD  [facility] [phone] (phone) [phone] (fax)  [facility]

## 2023-01-22 ENCOUNTER — Other Ambulatory Visit: Payer: Self-pay | Admitting: Family Medicine

## 2023-01-22 DIAGNOSIS — K529 Noninfective gastroenteritis and colitis, unspecified: Secondary | ICD-10-CM

## 2023-01-22 DIAGNOSIS — K8689 Other specified diseases of pancreas: Secondary | ICD-10-CM

## 2023-01-22 MED ORDER — PANCRELIPASE (LIP-PROT-AMYL) 36000-114000 UNITS PO CPEP: ORAL_CAPSULE | ORAL | 3 refills | Status: DC

## 2023-01-23 LAB — OVA AND PARASITE EXAMINATION

## 2023-01-23 LAB — CLOSTRIDIUM DIFFICILE BY PCR: Toxigenic C. Difficile by PCR: NEGATIVE

## 2023-01-23 LAB — PANCREATIC ELASTASE, FECAL: Pancreatic Elastase, Fecal: 13 ug Elast./g — ABNORMAL LOW (ref >200)

## 2023-01-23 LAB — FECAL LEUKOCYTES

## 2023-01-23 LAB — STOOL CULTURE: E coli, Shiga toxin Assay: NEGATIVE

## 2023-01-23 LAB — C DIFFICILE TOXINS A+B W/RFLX: C difficile Toxins A+B, EIA: NEGATIVE

## 2023-01-23 LAB — C DIFFICILE, CYTOTOXIN B

## 2023-01-24 ENCOUNTER — Ambulatory Visit: Payer: Self-pay | Admitting: *Deleted

## 2023-01-24 NOTE — Telephone Encounter (Signed)
Message from Lennox Pippins sent at 01/24/2023  1:55 PM EDT  Summary: possible hernia   Patient called in, and stated between his belly button & his private area, that he has a knot that has been there, he noticed it about a week ago or so. He stated maybe it is a possible hernia? He said it may be about an inch long & 1/2 inch wide. Patient stated no pain or any other symptoms along with this. He wanted Dr Sherrie Mustache to be aware of this knot.   Patients callback # [phone]          Call History  Contact Date/Time Type Contact Phone/Fax User  01/24/2023 01:52 PM EDT Phone (Incoming) [place], Keden Risinger (Self) [phone] Judie Petit) [place], Army Melia   Reason for Disposition  [1] Small swelling or lump AND [2] unexplained AND [3] present > 1 week    Pt declined an appt regarding the lump in his lower abd.  Answer Assessment - Initial Assessment Questions 1. APPEARANCE of SWELLING: "What does it look like?"     I have a knot between my belly button and my private area.   It's hard but does not hurt.   It's been there a long time.   I was scared to tell Dr. Sherrie Mustache about it when I was there on 01/15/2023.  I'm not worried about the lump.    I just need this medication.      I'm having problems getting this medicine he wants me to take.    My pharmacy doesn't have it, [place]. Creon Dr 36,000 units.   [facility] [place] [facility] not enough to fill the whole prescription.     Can they get the rest of the medicine from another [facility]?      [facility] can't get it.   They [facility] [place].   They can't get the full 36,000 units.   I'm supposed to call the [facility] back in a couple of hours and see what they found out.     I'm having diarrhea all the time.   I'm so sick of having this diarrhea.     I clarified with him about the knot he originally called in about.   He said "Never mind that for now".    "It's been there a long time so I'm not worried about it".    "I'm more concerned about  getting the medicine started so I can stop pooping all the time".   "I can't stop pooping".    "I can't go anywhere".    "I go on myself sometimes".    "I just want this medicine".       2. SIZE: "How large is the swelling?" (e.g., inches, cm; or compare to size of pinhead, tip of pen, eraser, coin, pea, grape, ping pong ball)      It's between my belly button and private area. 3. LOCATION: "Where is the swelling located?"     above 4. ONSET: "When did the swelling start?"     "It's been there a very long time. 5. COLOR: "What color is it?" "Is there more than one color?"     Not asked 6. PAIN: "Is there any pain?" If Yes, ask: "How bad is the pain?" (e.g., scale 1-10; or mild, moderate, severe)     - NONE (0): no pain   - MILD (1-3): doesn't interfere with normal activities    -  MODERATE (4-7): interferes with normal activities or awakens from sleep    - SEVERE (8-10): excruciating pain, unable to do any normal activities     No pain 7. ITCH: "Does it itch?" If Yes, ask: "How bad is the itch?"      No 8. CAUSE: "What do you think caused the swelling?" 9 OTHER SYMPTOMS: "Do you have any other symptoms?" (e.g., fever)     Maybe it's a hernia.   I don't know       I have diarrhea all the time.  Protocols used: Skin Lump or Localized Swelling-A-AH

## 2023-01-24 NOTE — Telephone Encounter (Signed)
Patient was seen by provider 8/26- results 8/28-patient has been notified during triage because medication has been Rx'd and patient is unaware.: Stool test shows severe pancreatic insufficiency. He needs to start pancreatic enzymes and a referral to gastronenterology. Have placed referral order and sent prescription for pancreatic enzymes to his pharmacy.    Chief Complaint: severe diarrhea- finished antibiotic- requesting another Symptoms: severe diarrhea- uncontrolled  Frequency: 2 months Pertinent Negatives: Patient denies fever, blood in stool Disposition: [] ED /[] Urgent Care (no appt availability in office) / [] Appointment(In office/virtual)/ []  [place] [facility]/ [] Home Care/ [] Refused Recommended Disposition /[] [place] [facility]/ [x]  Follow-up with PCP Additional Notes: Patient has been informed of test results and PCP recommendations- he will get medication tomorrow. Patient wants to know if this is expected to help diarrhea symptoms and how long it should take. Patient advised I would send message to office- PCP is not in but will request follow up information.   Reason for Disposition  [1] SEVERE diarrhea (e.g., 7 or more times / day more than normal) AND [2] age > 60 years    Patient was seen by provider 8/26- and has results in chart  Answer Assessment - Initial Assessment Questions 1. DIARRHEA SEVERITY: "How bad is the diarrhea?" "How many more stools have you had in the past 24 hours than normal?"    - NO DIARRHEA (SCALE 0)   - MILD (SCALE 1-3): Few loose or mushy BMs; increase of 1-3 stools over normal daily number of stools; mild increase in ostomy output.   -  MODERATE (SCALE 4-7): Increase of 4-6 stools daily over normal; moderate increase in ostomy output.   -  SEVERE (SCALE 8-10; OR "WORST POSSIBLE"): Increase of 7 or more stools daily over normal; moderate increase in ostomy output; incontinence.     severe 2. ONSET: "When did the diarrhea begin?"      2  months- getting worse over past 2 weeks 3. BM CONSISTENCY: "How loose or watery is the diarrhea?"      Loose and watery 4. VOMITING: "Are you also vomiting?" If Yes, ask: "How many times in the past 24 hours?"      no 5. ABDOMEN PAIN: "Are you having any abdomen pain?" If Yes, ask: "What does it feel like?" (e.g., crampy, dull, intermittent, constant)      no 6. ABDOMEN PAIN SEVERITY: If present, ask: "How bad is the pain?"  (e.g., Scale 1-10; mild, moderate, or severe)   - MILD (1-3): doesn't interfere with normal activities, abdomen soft and not tender to touch    - MODERATE (4-7): interferes with normal activities or awakens from sleep, abdomen tender to touch    - SEVERE (8-10): excruciating pain, doubled over, unable to do any normal activities       na 7. ORAL INTAKE: If vomiting, "Have you been able to drink liquids?" "How much liquids have you had in the past 24 hours?"     Patient is drinking water- no appetite- food goes through him 8. HYDRATION: "Any signs of dehydration?" (e.g., dry mouth [not just dry lips], too weak to stand, dizziness, new weight loss) "When did you last urinate?"     Weight loss  10. ANTIBIOTIC USE: "Are you taking antibiotics now or have you taken antibiotics in the past 2 months?"       Just finished antibiotic-yesterday (pneumonia/bronchitis  11. OTHER SYMPTOMS: "Do you have any other symptoms?" (e.g., fever, blood in stool)  no  Protocols used: Diarrhea-A-AH

## 2023-01-24 NOTE — Telephone Encounter (Signed)
  Chief Complaint: He has a lump between his belly button and private parts.   Symptoms: No pain.   Been there a long time.   "I was afraid to tell Dr. Sherrie Mustache about it when I saw him on 01/15/2023". He then changed the subject and was concerned about a medication Dr. Sherrie Mustache prescribed for the diarrhea.  Creon.   [facility] Does not have it.    [facility] and [facility] don't [facility] to fill the whole rx so they asked the pt to call [facility] in a couple of hours and they would give him an update.   [facility] is apparently trying to find more of the creon so they can fill the prescription.    Pt will find out more when he calls them back in a couple of hours.  Frequency: today trying to get the Creon filled   I'm tired of all this diarrhea" Pertinent Negatives: Patient denies N/A Disposition: [] ED /[] Urgent Care (no appt availability in office) / [] Appointment(In office/virtual)/ []  [place] [facility]/ [] Home Care/ [] Refused Recommended Disposition /[] [place] [facility]/ [x]  Follow-up with PCP Additional Notes: FYI sent to [facility].

## 2023-01-25 NOTE — Telephone Encounter (Signed)
The medication should help with diarrhea symptoms and he should see results within a few days

## 2023-01-25 NOTE — Telephone Encounter (Signed)
Advised 

## 2023-01-26 ENCOUNTER — Ambulatory Visit: Payer: Self-pay

## 2023-01-26 NOTE — Telephone Encounter (Signed)
He's not making any digestive enzymes, so dietary changes won't make any difference. The only he can do for now is take the Creon which supplies digestive enzymes every time he eats. Gastroenterologist will need to see him for further evaluation.

## 2023-01-26 NOTE — Telephone Encounter (Signed)
  Chief Complaint: Creon is working. Pt would like to speak to a dietician or get information mailed to him Symptoms:  Frequency:  Pertinent Negatives: Patient denies  Disposition: [] ED /[] Urgent Care (no appt availability in office) / [] Appointment(In office/virtual)/ []  [place] [facility]/ [] Home Care/ [] Refused Recommended Disposition /[] [place] [facility]/ [x]  Follow-up with PCP Additional Notes: Pt states that Creon is working. Pt would like to speak to a dietician or have information mailed to him regarding dietary changes that he will need to make to reduce re-occurrence of diarrhea. Please advise.    Summary: CREON?   Patient called back as he spoke with a nurse a few days ago but he has questions about a medication called Creon. Please assist patient further       Reason for Disposition  [1] Caller requesting NON-URGENT health information AND [2] PCP's office is the best resource  Answer Assessment - Initial Assessment Questions 1. REASON FOR CALL or QUESTION: "What is your reason for calling today?" or "How can I best help you?" or "What question do you have that I can help answer?"     Pt states that creon is working. Pt would like to speak to a dietician regarding dietary changes to stop re-occurrence.  Protocols used: Information Only Call - No Triage-A-AH

## 2023-01-26 NOTE — Telephone Encounter (Signed)
Patient advised.

## 2023-01-29 ENCOUNTER — Ambulatory Visit: Payer: Self-pay | Admitting: *Deleted

## 2023-01-29 ENCOUNTER — Ambulatory Visit: Payer: Self-pay

## 2023-01-29 DIAGNOSIS — N390 Urinary tract infection, site not specified: Secondary | ICD-10-CM

## 2023-01-29 NOTE — Telephone Encounter (Signed)
Chief Complaint: Medication Question about Creon Symptoms: top of left foot swollen, dark colored urine (brownish color from yellow/orange color) Frequency: Onset swelling 1-2 days after starting Creon, dark urine this morning Pertinent Negatives: Patient denies other symptoms Disposition: [] ED /[] Urgent Care (no appt availability in office) / [] Appointment(In office/virtual)/ []  [place] [facility]/ [] Home Care/ [] Refused Recommended Disposition /[] [place] [facility]/ [x]  Follow-up with PCP Additional Notes: Patient says he looked it up and creon does cause some swelling. He wants to know if creon is causing his left foot to swell and dark colored urine. He says the diarrhea is better. I asked is he staying hydrated. He says yes he drinks plenty of water. He asked would it be ok to drink Pedialyte. Advised that would be ok to drink along with the water. Advised I will send this to Dr. Sherrie Mustache for his review and recommendation, then someone will call back with what Dr. Sherrie Mustache recommends. Patient verbalized understanding.    Summary: Pt reports that his foot is swollen and he has dark urine.   Pt reports that his foot is swollen and he has dark urine. Pt requests that a nurse return his call.  Cb# [phone]       Reason for Disposition  [1] Caller has NON-URGENT medicine question about med that PCP prescribed AND [2] triager unable to answer question  Answer Assessment - Initial Assessment Questions 1. NAME of MEDICINE: "What medicine(s) are you calling about?"     Creon 2. QUESTION: "What is your question?" (e.g., double dose of medicine, side effect)     Does it causes dark urine and swelling 3. PRESCRIBER: "Who prescribed the medicine?" Reason: if prescribed by specialist, call should be referred to that group.     Dr. Sherrie Mustache 4. SYMPTOMS: "Do you have any symptoms?" If Yes, ask: "What symptoms are you having?"  "How bad are the symptoms (e.g., mild, moderate, severe)     Dark  urine, swollen top of left foot  Protocols used: Medication Question Call-A-AH

## 2023-01-29 NOTE — Telephone Encounter (Signed)
  Chief Complaint: Urinary Symptoms Symptoms:  Urine dark, bladder pressure , frequency "Peeing on myself more than usual."  "Maybe a slight fever." Frequency: Urine "Turned dark this AM, was orange,' Other symptoms 2 days. Pertinent Negatives: Patient denies  Disposition: [] ED /[] Urgent Care (no appt availability in office) / [] Appointment(In office/virtual)/ []  [place] [facility]/ [] Home Care/ [x] Refused Recommended Disposition /[] [place] [facility]/ []  Follow-up with PCP Additional Notes: Advised Appt in AM, states no transportation. Advised ED, no way there. Advised EMS, declines. States "Dr. Sherrie Mustache knows my predicament and usually calls an antibiotic in for me." Assured pt NT would route to practice for PCPs review. Reiterated need for ED eval, certainly with worsening symptoms. Pt verbalizes understanding. Pt is paraplegic.   Reason for Disposition  Urinating more frequently than usual (i.e., frequency)  Answer Assessment - Initial Assessment Questions 1. SYMPTOM: "What's the main symptom you're concerned about?" (e.g., frequency, incontinence)     *No Answer* 2. ONSET: "When did the  start?"     2 days 3. PAIN: "Is there any pain?" If Yes, ask: "How bad is it?" (Scale: 1-10; mild, moderate, severe)     *No Answer* 4. CAUSE: "What do you think is causing the symptoms?"     *No Answer* 5. OTHER SYMPTOMS: "Do you have any other symptoms?" (e.g., blood in urine, fever, flank pain, pain with urination)     Bladder pressure 6. PREGNANCY: "Is there any chance you are pregnant?" "When was your last menstrual period?"     *No Answer*  Protocols used: Urinary Symptoms-A-AH

## 2023-01-29 NOTE — Telephone Encounter (Signed)
I doubt that creon would be causing swelling of just one foot. If its sore or painful then he needs to have it evaluated, or if it doesn't clear up on its own within a few weeks.

## 2023-01-30 MED ORDER — CIPROFLOXACIN HCL 500 MG PO TABS: 500.0000 mg | ORAL_TABLET | Freq: Two times a day (BID) | ORAL | 0 refills | Status: AC

## 2023-01-30 NOTE — Telephone Encounter (Signed)
Patient aware.

## 2023-01-30 NOTE — Telephone Encounter (Signed)
Have sent prescription abs to [facility]

## 2023-01-30 NOTE — Telephone Encounter (Signed)
Patient advised. Reports no soreness or pain. Verbalized understanding

## 2023-01-30 NOTE — Addendum Note (Signed)
Addended by: Malva Limes on: 01/30/2023 08:04 AM   Modules accepted: Orders

## 2023-02-05 ENCOUNTER — Telehealth: Payer: Self-pay

## 2023-02-05 NOTE — Telephone Encounter (Signed)
Copied from CRM [id number]. Topic: Appointment Scheduling - Scheduling Inquiry for Clinic >> Feb 05, 2023  9:31 AM Lee Clark wrote: Reason for CRM: patient called stated his left foot is swollen, no appts before 10/4 as he wants to only see Dr Sherrie Mustache. He is requesting Clark call back from his nurse to see if he can get in on Friday >> Feb 05, 2023 10:37 AM Lee Clark wrote: Pt is calling back wanting to speak with Dr. Sherrie Mustache nurse. Pt is wanting to see if there is anyway possible for him to be worked in with him or another doctor. I did offer him an appt with Dr. Payton Mccallum 02/14/2023, pt states that he cannot wait that long and is wanting Clark call back to discuss. Pt does not want to see a NP.

## 2023-02-09 ENCOUNTER — Ambulatory Visit (INDEPENDENT_AMBULATORY_CARE_PROVIDER_SITE_OTHER): Payer: Medicare Other | Admitting: Family Medicine

## 2023-02-09 ENCOUNTER — Ambulatory Visit: Payer: Medicare Other | Admitting: Family Medicine

## 2023-02-09 VITALS — BP 127/60 | HR 94

## 2023-02-09 DIAGNOSIS — R609 Edema, unspecified: Secondary | ICD-10-CM | POA: Diagnosis not present

## 2023-02-09 DIAGNOSIS — K529 Noninfective gastroenteritis and colitis, unspecified: Secondary | ICD-10-CM

## 2023-02-09 DIAGNOSIS — K8689 Other specified diseases of pancreas: Secondary | ICD-10-CM | POA: Insufficient documentation

## 2023-02-09 DIAGNOSIS — J189 Pneumonia, unspecified organism: Secondary | ICD-10-CM

## 2023-02-09 NOTE — Progress Notes (Unsigned)
Established patient visit   Patient: Lee Clark   DOB: [birth year]   67 y.o. Male  MRN: [id number] Visit Date: 02/09/2023  Today's healthcare provider: [name], MD   Chief Complaint  Patient presents with   Foot Swelling    Left foot swelling and some stomach issues having bowel movements in my sleep   Subjective    Discussed the use of AI scribe software for clinical note transcription with the patient, who gave verbal consent to proceed.  History of Present Illness   The patient presents with progressive bilateral lower extremity swelling, initially on the right, but today started on the left. He reports that the swelling began when he started sleeping in a fetal position with his feet on the bed. The swelling has progressively worsened and is now affecting the left lower extremity as well. He notes that the skin over the swollen areas indents when pressed and slowly returns to its original position, suggesting pitting edema. He denies pain but reports decreased sensation in the affected areas.  He was recently diagnosed with pancreatic insufficient with low albumin. He has been in contact with a GI specialist regarding his pancreatic insufficiency and is taking Creon to manage his symptoms. He reports some improvement but still experiences fecal incontinence during sleep. He has increased his Creon dosage on his own, which seems to help.    The patient has a history of a blood clot in his youth, but he does not believe the current swelling is due to a clot. He suspects it is due to fluid buildup.  He also had cough at his visit last month with recent chest x-ray showed possible pneumonia. he was prescribed azithromycin and his breathing has improved since then. He is due for a follow-up chest x-ray.       Medications: Outpatient Medications Prior to Visit  Medication Sig   cholestyramine light (PREVALITE) 4 GM/DOSE powder Take (4 g total) by mouth 4 (four) times daily  as needed. Mixed with water   diphenoxylate-atropine (LOMOTIL) 2.5-0.025 MG tablet Take 1-2 tablets by mouth 4 (four) times daily as needed for diarrhea or loose stools.   gentamicin ointment (GARAMYCIN) 0.1 %  (Patient not taking: Reported on 02/09/2023)   hyoscyamine (LEVBID) 0.375 MG 12 hr tablet Take 1 tablet (0.375 mg total) by mouth 2 (two) times daily.   ipratropium (ATROVENT) 0.03 % nasal spray ipratropium bromide 21 mcg (0.03 %) nasal spray (Patient not taking: Reported on 02/20/2022)   ipratropium (ATROVENT) 0.06 % nasal spray Place into the nose. (Patient not taking: Reported on 02/09/2023)   lipase/protease/amylase (CREON) 36000 UNITS CPEP capsule Take 2 capsules (72,000 Units total) by mouth 3 (three) times daily with meals. May also take 1 capsule (36,000 Units total) as needed (with snacks).   methylPREDNISolone (MEDROL DOSEPAK) 4 MG TBPK tablet  (Patient not taking: Reported on 02/09/2023)   triamcinolone (NASACORT) 55 MCG/ACT AERO nasal inhaler    triamcinolone cream (KENALOG) 0.1 % Apply 1 application. topically daily. (Patient not taking: Reported on 02/09/2023)   No facility-administered medications prior to visit.   Review of Systems  Constitutional:  Negative for appetite change, chills and fever.  Respiratory:  Negative for chest tightness, shortness of breath and wheezing.   Cardiovascular:  Negative for chest pain and palpitations.  Gastrointestinal:  Negative for abdominal pain, nausea and vomiting.   {Insert previous labs (optional):23779} {See past labs  Heme  Chem  Endocrine  Serology  Results  Review (optional):1}   Objective    BP 127/60 (BP Location: Left Arm, Patient Position: Sitting, Cuff Size: Normal)   Pulse 94   SpO2 94% {Insert last BP/Wt (optional):23777}{See vitals history (optional):1}  Physical Exam   General: Appearance:    Thin male in no acute distress  Eyes:    PERRL, conjunctiva/corneas clear, EOM's intact       Lungs:     Clear to  auscultation bilaterally, respirations unlabored  Heart:    Normal heart rate. Normal rhythm. No murmurs, rubs, or gallops.    MS:   All extremities are intact.  2+ pitting edema left foot and distal ankle, 1+ on right. No erythema. No calf tenderness, negative Homan's sign   Neurologic:   Awake, alert, oriented x 3. No apparent focal neurological defect.         Assessment & Plan        Lower Extremity Edema New onset bilateral lower extremity edema, likely secondary to hypoalbuminemia due to pancreatic insufficiency. Low suspicion for DVT but will check D-dimer -Order blood work to check albumin levels and rule out DVT. -Advise patient to keep feet elevated as much as possible.  Pancreatic Insufficiency Patient reports improvement with Creon, but still experiencing some fecal incontinence. Increased Creon dose on own accord with noted improvement. -Continue current Creon regimen as adjusted by patient. -Refer to Gastroenterology for further management.  Pneumonia Patient reports improved breathing since last visit when he was prescribed azithromycin. Chest x-ray needed to confirm resolution. -Order chest x-ray today to assess for resolution of pneumonia.    No follow-ups on file.      [name], MD  [facility] [phone] (phone) [phone] (fax)  [facility]

## 2023-02-09 NOTE — Patient Instructions (Addendum)
Please review the attached list of medications and notify my office if there are any errors.   Call [facility] at [phone] to schedule an appointment for pancreatic insufficiency located at [facility] near [facility]

## 2023-02-10 LAB — COMPREHENSIVE METABOLIC PANEL
ALT: 3 IU/L (ref 0–44)
AST: 12 IU/L (ref 0–40)
Albumin: 3.2 g/dL — ABNORMAL LOW (ref 3.9–4.9)
Alkaline Phosphatase: 115 IU/L (ref 44–121)
BUN/Creatinine Ratio: 15 (ref 10–24)
BUN: 10 mg/dL (ref 8–27)
Bilirubin Total: 0.2 mg/dL (ref 0.0–1.2)
CO2: 19 mmol/L — ABNORMAL LOW (ref 20–29)
Calcium: 8.4 mg/dL — ABNORMAL LOW (ref 8.6–10.2)
Chloride: 103 mmol/L (ref 96–106)
Creatinine, Ser: 0.68 mg/dL — ABNORMAL LOW (ref 0.76–1.27)
Globulin, Total: 3.2 g/dL (ref 1.5–4.5)
Glucose: 134 mg/dL — ABNORMAL HIGH (ref 70–99)
Potassium: 3.2 mmol/L — ABNORMAL LOW (ref 3.5–5.2)
Sodium: 140 mmol/L (ref 134–144)
Total Protein: 6.4 g/dL (ref 6.0–8.5)
eGFR: 103 mL/min/{1.73_m2} (ref >59)

## 2023-02-10 LAB — D-DIMER, QUANTITATIVE: D-DIMER: 4.08 mg/L FEU — ABNORMAL HIGH (ref 0.00–0.49)

## 2023-02-11 ENCOUNTER — Other Ambulatory Visit: Payer: Self-pay | Admitting: Family Medicine

## 2023-02-11 DIAGNOSIS — I8290 Acute embolism and thrombosis of unspecified vein: Secondary | ICD-10-CM

## 2023-02-11 MED ORDER — APIXABAN 5 MG PO TABS
ORAL_TABLET | ORAL | 0 refills | Status: DC
Start: 2023-02-11 — End: 2023-02-28

## 2023-02-11 MED ORDER — APIXABAN 5 MG PO TABS: ORAL_TABLET | ORAL | 0 refills | Status: DC

## 2023-02-11 NOTE — Progress Notes (Signed)
Patient called access nurse line requesting eliquis prescription be sent to [facility] [address] in [name]   Reviewed result note from PCP dated 02/11/23  Rx sent for 60 tabs of eliquis   Advised RN that patient is ok to use compression socks while transferring to wheelchair as normal, also advised for pt to notify office of hematuria, hemoptysis or hematochezia while on eliquis. RN will update patient on prescription and potential adverse effects of AC.   Ronnald Ramp, MD  [facility]

## 2023-02-12 ENCOUNTER — Telehealth: Payer: Self-pay | Admitting: Family Medicine

## 2023-02-12 ENCOUNTER — Other Ambulatory Visit: Payer: Self-pay | Admitting: Family Medicine

## 2023-02-12 ENCOUNTER — Ambulatory Visit: Payer: Self-pay

## 2023-02-12 DIAGNOSIS — R7989 Other specified abnormal findings of blood chemistry: Secondary | ICD-10-CM

## 2023-02-12 DIAGNOSIS — R609 Edema, unspecified: Secondary | ICD-10-CM

## 2023-02-12 NOTE — Telephone Encounter (Signed)
Summary: pos blood clot   Pt states Dr Sherrie Mustache called and told him he has a small blood clot behind his knee and needs an US done asap.  I do not see any notes to that fact.  or a referral for an [place].  Please advise     Chief Complaint: Pt. States Dr. Sherrie Mustache sent him a message he has a DVT left leg and needs an ultrasound. Please advise pt. Symptoms: Swelling left foot Frequency: Months ago per pt. Pertinent Negatives: Patient denies ant SOB, chest pain. Disposition: [] ED /[] Urgent Care (no appt availability in office) / [] Appointment(In office/virtual)/ []  [facility]/ [] Home Care/ [] Refused Recommended Disposition /[] [place] [facility]/ [x]  Follow-up with PCP Additional Notes: Please advise pt. About ultrasound.  Reason for Disposition  [1] Thigh, calf, or ankle swelling AND [2] only 1 side  Answer Assessment - Initial Assessment Questions 1. ONSET: "When did the swelling start?" (e.g., minutes, hours, days)     Months ago 2. LOCATION: "What part of the leg is swollen?"  "Are both legs swollen or just one leg?"     Left leg 3. SEVERITY: "How bad is the swelling?" (e.g., localized; mild, moderate, severe)   - Localized: Small area of swelling localized to one leg.   - MILD pedal edema: Swelling limited to foot and ankle, pitting edema < 1/4 inch (6 mm) deep, rest and elevation eliminate most or all swelling.   - MODERATE edema: Swelling of lower leg to knee, pitting edema > 1/4 inch (6 mm) deep, rest and elevation only partially reduce swelling.   - SEVERE edema: Swelling extends above knee, facial or hand swelling present.      Foot 4. REDNESS: "Does the swelling look red or infected?"     No 5. PAIN: "Is the swelling painful to touch?" If Yes, ask: "How painful is it?"   (Scale 1-10; mild, moderate or severe)     None 6. FEVER: "Do you have a fever?" If Yes, ask: "What is it, how was it measured, and when did it start?"      No 7. CAUSE: "What do you think is causing  the leg swelling?"     Dr. Sherrie Mustache told him it was a DVT 8. MEDICAL HISTORY: "Do you have a history of blood clots (e.g., DVT), cancer, heart failure, kidney disease, or liver failure?"     Yes 9. RECURRENT SYMPTOM: "Have you had leg swelling before?" If Yes, ask: "When was the last time?" "What happened that time?"     Yes 10. OTHER SYMPTOMS: "Do you have any other symptoms?" (e.g., chest pain, difficulty breathing)       No 11. PREGNANCY: "Is there any chance you are pregnant?" "When was your last menstrual period?"       N/a  Protocols used: Leg Swelling and Edema-A-AH

## 2023-02-12 NOTE — Telephone Encounter (Signed)
Pt is calling to follow up on the below request. Please advise CB- [phone]

## 2023-02-12 NOTE — Telephone Encounter (Signed)
Pt is calling in because he wants to know where he is supposed to go for his ultrasound of his leg. Please follow up with pt.

## 2023-02-13 NOTE — Telephone Encounter (Signed)
Patient aware of location for chest xray and that the imaging center will get in contact with him for appointment date and time on the [place].

## 2023-02-15 ENCOUNTER — Ambulatory Visit
Admission: RE | Admit: 2023-02-15 | Discharge: 2023-02-15 | Disposition: A | Discharge status: Home / Self Care | Payer: Medicare Other | Source: Ambulatory Visit | Attending: Family Medicine | Admitting: Family Medicine

## 2023-02-15 DIAGNOSIS — M7989 Other specified soft tissue disorders: Secondary | ICD-10-CM | POA: Diagnosis not present

## 2023-02-15 DIAGNOSIS — M79605 Pain in left leg: Secondary | ICD-10-CM | POA: Diagnosis not present

## 2023-02-15 DIAGNOSIS — R609 Edema, unspecified: Secondary | ICD-10-CM | POA: Insufficient documentation

## 2023-02-15 DIAGNOSIS — R7989 Other specified abnormal findings of blood chemistry: Secondary | ICD-10-CM | POA: Insufficient documentation

## 2023-02-15 DIAGNOSIS — I82432 Acute embolism and thrombosis of left popliteal vein: Secondary | ICD-10-CM | POA: Diagnosis not present

## 2023-02-15 DIAGNOSIS — I82512 Chronic embolism and thrombosis of left femoral vein: Secondary | ICD-10-CM | POA: Diagnosis not present

## 2023-02-16 ENCOUNTER — Other Ambulatory Visit: Payer: Self-pay | Admitting: Family Medicine

## 2023-02-16 DIAGNOSIS — I82512 Chronic embolism and thrombosis of left femoral vein: Secondary | ICD-10-CM | POA: Insufficient documentation

## 2023-02-22 ENCOUNTER — Other Ambulatory Visit: Payer: Self-pay | Admitting: Family Medicine

## 2023-02-22 NOTE — Telephone Encounter (Signed)
Medication Refill - Medication: lipase/protease/amylase (CREON) 36000 UNITS CPEP capsule [[id number]]   Has the patient contacted their pharmacy? Yes.    (Agent: If yes, when and what did the pharmacy advise?) Contact PCP   Preferred Pharmacy (with phone number or street name): [id number] [place], [place] - [address]   Has the patient been seen for an appointment in the last year OR does the patient have an upcoming appointment? Yes.    Agent: Please be advised that RX refills may take up to 3 business days. We ask that you follow-up with your pharmacy.   Pt says the medication is not lasting him because he is eating more meals and snacks. Pt currently has 5 meals worth of pills left and says his medication will not last until the 13th. Pt is requesting two bottles of the medication.

## 2023-02-22 NOTE — Telephone Encounter (Signed)
Requested medication (s) are due for refill today: routing for review  Requested medication (s) are on the active medication list: yes  Last refill:  01/22/23  Future visit scheduled: no  Medication not assigned to a protocol, review manually.  Notes to clinic:       Requested Prescriptions  Pending Prescriptions Disp Refills   lipase/protease/amylase (CREON) 36000 UNITS CPEP capsule 240 capsule 3    Sig: Take 2 capsules (72,000 Units total) by mouth 3 (three) times daily with meals. May also take 1 capsule (36,000 Units total) as needed (with snacks).     Off-Protocol Failed - 02/22/2023  1:24 PM      Failed - Medication not assigned to a protocol, review manually.      Passed - Valid encounter within last 12 months    Recent Outpatient Visits           1 week ago Edema, unspecified type   [facility] Malva Limes, MD   1 month ago Gastroesophageal reflux disease without esophagitis   [facility] Malva Limes, MD   1 year ago [facility]aplegia [facility])    [facility] Malva Limes, MD   2 years ago Encounter for [employer] annual wellness exam   [facility] Malva Limes, MD   2 years ago Olecranon bursitis of left elbow   [facility] Malva Limes, MD

## 2023-02-23 MED ORDER — PANCRELIPASE (LIP-PROT-AMYL) 36000-114000 UNITS PO CPEP: ORAL_CAPSULE | ORAL | 3 refills | Status: DC

## 2023-02-28 ENCOUNTER — Ambulatory Visit: Payer: Medicare Other

## 2023-02-28 ENCOUNTER — Other Ambulatory Visit: Payer: Self-pay | Admitting: Family Medicine

## 2023-02-28 VITALS — Ht 73.0 in | Wt 135.0 lb

## 2023-02-28 DIAGNOSIS — Z0000 Encounter for general adult medical examination without abnormal findings: Secondary | ICD-10-CM | POA: Diagnosis not present

## 2023-02-28 DIAGNOSIS — I8290 Acute embolism and thrombosis of unspecified vein: Secondary | ICD-10-CM

## 2023-02-28 MED ORDER — APIXABAN 5 MG PO TABS
5.0000 mg | ORAL_TABLET | Freq: Two times a day (BID) | ORAL | 0 refills | Status: DC
Start: 2023-02-28 — End: 2023-04-02

## 2023-02-28 NOTE — Patient Instructions (Addendum)
Lee Clark , Thank you for taking time to come for your Medicare Wellness Visit. I appreciate your ongoing commitment to your health goals. Please review the following plan we discussed and let me know if I can assist you in the future.   Referrals/Orders/Follow-Ups/Clinician Recommendations: none  This is a list of the screening recommended for you and due dates:  Health Maintenance  Topic Date Due   COVID-19 Vaccine (1) 03/16/2023*   Zoster (Shingles) Vaccine (1 of 2) 05/31/2023*   Flu Shot  08/20/2023*   Screening for Lung Cancer  02/28/2024*   DTaP/Tdap/Td vaccine (1 - Tdap) 02/28/2024*   Pneumonia Vaccine (1 of 2 - PCV) 02/28/2024*   Colon Cancer Screening  02/28/2024*   Medicare Annual Wellness Visit  02/28/2024   Hepatitis C Screening  Completed   HPV Vaccine  Aged Out  *Topic was postponed. The date shown is not the original due date.    Advanced directives: (In Chart) A copy of your advanced directives are scanned into your chart should your provider ever need it.  Next Medicare Annual Wellness Visit scheduled for next year: Yes  03/05/24 @ 2:20pm telephone

## 2023-02-28 NOTE — Progress Notes (Signed)
Subjective:   Lee Clark is a 67 y.o. male who presents for Medicare Annual/Subsequent preventive examination.  Visit Complete: Virtual I connected with  Acie Fredrickson [facility] on 02/28/23 by a audio enabled telemedicine application and verified that I am speaking with the correct person using two identifiers.  Patient Location: Home  Provider Location: Home Office  I discussed the limitations of evaluation and management by telemedicine. The patient expressed understanding and agreed to proceed.  Vital Signs: Because this visit was a virtual/telehealth visit, some criteria may be missing or patient reported. Any vitals not documented were not able to be obtained and vitals that have been documented are patient reported.  Patient Medicare AWV questionnaire was completed by the patient on 02/27/23; I have confirmed that all information answered by patient is correct and no changes since this date.  Cardiac Risk Factors include: advanced age (>31men, >69 women);male gender;sedentary lifestyle;smoking/ tobacco exposure     Objective:    Today's Vitals   02/27/23 1626 02/28/23 1522  Weight:  135 lb (61.2 kg)  Height:  6\' 1"  (1.854 m)  PainSc: 7     Body mass index is 17.81 kg/m.     02/28/2023    3:37 PM 02/20/2022    3:36 PM 09/22/2019    2:45 PM 09/16/2018    1:35 PM 08/07/2016    2:12 PM 11/25/2014    2:35 PM 10/29/2014    2:51 PM  Advanced Directives  Does Patient Have a Medical Advance Directive? Yes Yes No No No No No  Type of Advance Directive Living will;Healthcare Power of [employer] Power of [employer];Living will       Does patient want to make changes to medical advance directive?  No - Patient declined       Copy of Healthcare Power of Attorney in Chart?  Yes - validated most recent copy scanned in chart (See row information)       Would patient like information on creating a medical advance directive?   No - Patient declined No - Guardian declined  Yes - Educational  materials given No - patient declined information    Current Medications (verified) Outpatient Encounter Medications as of 02/28/2023  Medication Sig   apixaban (ELIQUIS) 5 MG TABS tablet Take 1 tablet (5 mg total) by mouth 2 (two) times daily.   cholestyramine light (PREVALITE) 4 GM/DOSE powder Take (4 g total) by mouth 4 (four) times daily as needed. Mixed with water   hyoscyamine (LEVBID) 0.375 MG 12 hr tablet Take 1 tablet (0.375 mg total) by mouth 2 (two) times daily.   ipratropium (ATROVENT) 0.06 % nasal spray Place into the nose.   lipase/protease/amylase (CREON) 36000 UNITS CPEP capsule Take 2 capsules (72,000 Units total) by mouth 3 (three) times daily with meals. May also take 1 capsule (36,000 Units total) as needed (with snacks).   triamcinolone cream (KENALOG) 0.1 % Apply 1 application. topically daily.   [DISCONTINUED] apixaban (ELIQUIS) 5 MG TABS tablet Take 2 tablets twice a day for 7 days, then 1 tablet twice a day   [DISCONTINUED] diphenoxylate-atropine (LOMOTIL) 2.5-0.025 MG tablet Take 1-2 tablets by mouth 4 (four) times daily as needed for diarrhea or loose stools.   [DISCONTINUED] methylPREDNISolone (MEDROL DOSEPAK) 4 MG TBPK tablet  (Patient not taking: Reported on 02/09/2023)   [DISCONTINUED] triamcinolone (NASACORT) 55 MCG/ACT AERO nasal inhaler    No facility-administered encounter medications on file as of 02/28/2023.    Allergies (verified) Ampicillin, Cephalexin, Cephalosporins, and  Sulfa antibiotics   History: Past Medical History:  Diagnosis Date   Colon polyp 07/14/2015   History of chicken pox    Paraplegia following spinal cord injury (HCC)    partial with minimal movement, sensation from injury at L1 level   Shingles    Stroke [facility])    1970's   Tibial plateau fracture, left 11/26/2014   Past Surgical History:  Procedure Laterality Date   BACK SURGERY     gangrene on bilateral buttoks   BUTTOCK MASS EXCISION     bilateral, due to pressure sore that  became gangrene   CATARACT EXTRACTION Right 05/23/2022   [facility]   CHOLECYSTECTOMY     SPINE SURGERY     Family History  Problem Relation Age of Onset   Cancer Mother    Diabetes Mother    Hypertension Father    Stroke Father    Lupus Sister    Social History   Socioeconomic History   Marital status: Divorced    Spouse name: Not on file   Number of children: 5   Years of education: Not on file   Highest education level: 9th grade  Occupational History   Not on file  Tobacco Use   Smoking status: Every Day    Current packs/day: 0.50    Average packs/day: 0.5 packs/day for 54.8 years (27.4 ttl pk-yrs)    Types: Cigarettes    Start date: 1970   Smokeless tobacco: Never   Tobacco comments:    started smoking age  34 yo 1-2 ppd  Vaping Use   Vaping status: Never Used  Substance and Sexual Activity   Alcohol use: No   Drug use: Not Currently    Types: Marijuana   Sexual activity: Not Currently    Comment: occasionally  Other Topics Concern   Not on file  Social History Narrative   Not on file   Social Determinants of Health   Financial Resource Strain: Low Risk  (02/27/2023)   Overall Financial Resource Strain (CARDIA)    Difficulty of Paying Living Expenses: Not very hard  Food Insecurity: No Food Insecurity (02/27/2023)   Hunger Vital Sign    Worried About Running Out of Food in the Last Year: Never true    Ran Out of Food in the Last Year: Never true  Transportation Needs: No Transportation Needs (02/27/2023)   PRAPARE - Administrator, Civil Service (Medical): No    Lack of Transportation (Non-Medical): No  Physical Activity: Inactive (02/27/2023)   Exercise Vital Sign    Days of Exercise per Week: 0 days    Minutes of Exercise per Session: 0 min  Stress: No Stress Concern Present (02/27/2023)   [employer] of Occupational Health - Occupational Stress Questionnaire    Feeling of Stress : Only a little  Social Connections: Socially Isolated  (02/27/2023)   Social Connection and Isolation Panel [NHANES]    Frequency of Communication with Friends and Family: Once a week    Frequency of Social Gatherings with Friends and Family: Never    Attends Religious Services: Never    Diplomatic Services operational officer: No    Attends Engineer, structural: Never    Marital Status: Divorced    Tobacco Counseling Ready to quit: Not Answered Counseling given: Not Answered Tobacco comments: started smoking age  61 yo 1-2 ppd   Clinical Intake:  Pre-visit preparation completed: Yes  Pain : 0-10 Pain Score: 7  Pain Type: Chronic  pain Pain Location: Pelvis (legs and hips) Pain Descriptors / Indicators: Aching, Tingling, Burning, Numbness Pain Onset: More than a month ago Pain Frequency: Constant Pain Relieving Factors: takes nothing  Pain Relieving Factors: takes nothing  BMI - recorded: 17.81 Nutritional Status: BMI <19  Underweight Nutritional Risks: None Diabetes: No  How often do you need to have someone help you when you read instructions, pamphlets, or other written materials from your doctor or pharmacy?: 1 - Never  Interpreter Needed?: No  Comments: lives with girlfriend;son and daughter lives right next door Information entered by :: B.Aleja Yearwood,LPN   Activities of Daily Living    02/27/2023    4:26 PM  In your present state of health, do you have any difficulty performing the following activities:  Hearing? 0  Vision? 0  Difficulty concentrating or making decisions? 0  Walking or climbing stairs? 1  Dressing or bathing? 0  Doing errands, shopping? 0  Preparing Food and eating ? N  Using the Toilet? N  In the past six months, have you accidently leaked urine? Y  Do you have problems with loss of bowel control? Y  Managing your Medications? N  Managing your Finances? N  Housekeeping or managing your Housekeeping? N    Patient Care Team: Malva Limes, MD as PCP - General (Family  Medicine) Juanell Fairly, MD as Consulting Physician (Orthopedic Surgery) Wyline Mood, MD as Consulting Physician (Gastroenterology) Gilda Crease, Latina Craver, MD (Vascular Surgery)  Indicate any recent Medical Services you may have received from other than [facility] providers in the past year (date may be approximate).     Assessment:   This is a routine wellness examination for [facility].  Hearing/Vision screen Hearing Screening - Comments:: Pt says his hearing is good Vision Screening - Comments:: Pt says he had cataract surgery in January;readers only [facility]   Goals Addressed             This Visit's Progress    COMPLETED: DIET - DECREASE SODA OR JUICE INTAKE   On track    Recommend to decrease soda intake to 1 a day and increase water intake to 6-8 8 oz glasses a day.      COMPLETED: DIET - EAT MORE FRUITS AND VEGETABLES   On track      Depression Screen    02/28/2023    3:36 PM 02/09/2023    3:49 PM 01/15/2023    1:46 PM 02/20/2022    3:35 PM 02/16/2021    5:19 PM 09/22/2019    2:41 PM 12/02/2018    2:19 PM  PHQ 2/9 Scores  PHQ - 2 Score 0 1 6 0 0 0 0  PHQ- 9 Score  7 17 0   0    Fall Risk    02/27/2023    4:26 PM 01/15/2023    1:46 PM 02/20/2022    3:37 PM 02/16/2021    5:24 PM 09/22/2019    2:45 PM  Fall Risk   Falls in the past year? 0 0 0 0 0  Number falls in past yr: 0  0 0 0  Injury with Fall? 0 0 0 0 0  Risk for fall due to : No Fall Risks No Fall Risks No Fall Risks Impaired mobility   Follow up Falls prevention discussed;Education provided Falls evaluation completed Falls prevention discussed Falls evaluation completed     MEDICARE RISK AT HOME: Medicare Risk at Home Any stairs in or around the home?: No If so, are  there any without handrails?: No Home free of loose throw rugs in walkways, pet beds, electrical cords, etc?: Yes Adequate lighting in your home to reduce risk of falls?: Yes Life alert?: No Use of a cane, walker or w/c?: Yes Grab bars in the  bathroom?: No Shower chair or bench in shower?: No Elevated toilet seat or a handicapped toilet?: No  TIMED UP AND GO:  Was the test performed?  No    Cognitive Function:        02/28/2023    3:47 PM 02/16/2021    5:24 PM 09/22/2019    2:52 PM 09/16/2018    1:41 PM  6CIT Screen  What Year? 0 points 0 points 0 points 0 points  What month? 0 points 0 points 0 points 0 points  What time? 0 points 0 points 0 points 0 points  Count back from 20 0 points 0 points 0 points 0 points  Months in reverse 0 points 0 points 0 points 2 points  Repeat phrase 0 points 0 points 0 points 0 points  Total Score 0 points 0 points 0 points 2 points    Immunizations  There is no immunization history on file for this patient.  TDAP status: Due, Education has been provided regarding the importance of this vaccine. Advised may receive this vaccine at local pharmacy or Health Dept. Aware to provide a copy of the vaccination record if obtained from local pharmacy or Health Dept. Verbalized acceptance and understanding.  Flu Vaccine status: Declined, Education has been provided regarding the importance of this vaccine but patient still declined. Advised may receive this vaccine at local pharmacy or Health Dept. Aware to provide a copy of the vaccination record if obtained from local pharmacy or Health Dept. Verbalized acceptance and understanding.  Pneumococcal vaccine status: Declined,  Education has been provided regarding the importance of this vaccine but patient still declined. Advised may receive this vaccine at local pharmacy or Health Dept. Aware to provide a copy of the vaccination record if obtained from local pharmacy or Health Dept. Verbalized acceptance and understanding.   Covid-19 vaccine status: Declined, Education has been provided regarding the importance of this vaccine but patient still declined. Advised may receive this vaccine at local pharmacy or Health Dept.or vaccine clinic. Aware to  provide a copy of the vaccination record if obtained from local pharmacy or Health Dept. Verbalized acceptance and understanding.  Qualifies for Shingles Vaccine? Yes   Zostavax completed No   Shingrix Completed?: No.    Education has been provided regarding the importance of this vaccine. Patient has been advised to call insurance company to determine out of pocket expense if they have not yet received this vaccine. Advised may also receive vaccine at local pharmacy or Health Dept. Verbalized acceptance and understanding.  Screening Tests Health Maintenance  Topic Date Due   COVID-19 Vaccine (1) 03/16/2023 (Originally 12/15/1960)   Zoster Vaccines- Shingrix (1 of 2) 05/31/2023 (Originally 12/16/1975)   INFLUENZA VACCINE  08/20/2023 (Originally 12/21/2022)   Lung Cancer Screening  02/28/2024 (Originally 12/16/2006)   DTaP/Tdap/Td (1 - Tdap) 02/28/2024 (Originally 12/16/1975)   Pneumonia Vaccine 43+ Years old (1 of 2 - PCV) 02/28/2024 (Originally 12/16/1961)   Colonoscopy  02/28/2024 (Originally 01/09/2008)   Medicare Annual Wellness (AWV)  02/28/2024   Hepatitis C Screening  Completed   HPV VACCINES  Aged Out    Health Maintenance  There are no preventive care reminders to display for this patient.   Colorectal cancer screening: Type  of screening: Colonoscopy. Completed pt says has not had in years. Repeat every 5-10 years pt has GE appt 04/16/23  Lung Cancer Screening: (Low Dose CT Chest recommended if Age 3-80 years, 20 pack-year currently smoking OR have quit w/in 15years.) does qualify.   Lung Cancer Screening Referral: no pt declines  Additional Screening:  Hepatitis C Screening: does not qualify; Completed no  Vision Screening: Recommended annual ophthalmology exams for early detection of glaucoma and other disorders of the eye. Is the patient up to date with their annual eye exam?  Yes  Who is the provider or what is the name of the office in which the patient attends annual eye  exams? [facility] If pt is not established with a provider, would they like to be referred to a provider to establish care? No .   Dental Screening: Recommended annual dental exams for proper oral hygiene  Diabetic Foot Exam: n/a  Community Resource Referral / Chronic Care Management: CRR required this visit?  No   CCM required this visit?  No    Plan:     I have personally reviewed and noted the following in the patient's chart:   Medical and social history Use of alcohol, tobacco or illicit drugs  Current medications and supplements including opioid prescriptions. Patient is not currently taking opioid prescriptions. Functional ability and status Nutritional status Physical activity Advanced directives List of other physicians Hospitalizations, surgeries, and ER visits in previous 12 months Vitals Screenings to include cognitive, depression, and falls Referrals and appointments  In addition, I have reviewed and discussed with patient certain preventive protocols, quality metrics, and best practice recommendations. A written personalized care plan for preventive services as well as general preventive health recommendations were provided to patient.    Sue Lush, LPN   16/05/958   After Visit Summary: (MyChart) Due to this being a telephonic visit, the after visit summary with patients personalized plan was offered to patient via MyChart   Nurse Notes: Pt is paraplegic and states he is doing alright. He relays he gets out of the bed via w/c three times a week and remains in bed the other days.He says he transfers himself from bed to w/c. He states his girlfriend lives with him and his daughter/son-in law live next door in which they all help him in the things lacking. Pt relays he is being seen by vascular on 03/19/23 for a blood clot in his left leg he says has been there for years but is getting a little larger. He relays the bottom of left leg and foot are swelling.  Pt also relays he has appt on 04/16/23 with gastroenterology for diarrhea he has been experiencing. Pt declines all preventive care (gaps), screenings and vaccines.

## 2023-03-08 ENCOUNTER — Other Ambulatory Visit: Payer: Self-pay | Admitting: Family Medicine

## 2023-03-08 DIAGNOSIS — I8290 Acute embolism and thrombosis of unspecified vein: Secondary | ICD-10-CM

## 2023-03-08 NOTE — Telephone Encounter (Signed)
Medication Refill - Medication: apixaban (ELIQUIS) 5 MG TABS tablet  patient has 3 pills left. Does not have enough medication to hold him over until vascular surgeon on 03/19/2023  Has the patient contacted their pharmacy? No.  (Agent: If no, request that the patient contact the pharmacy for the refill. If patient does not wish to contact the pharmacy document the reason why and proceed with request.) No refills    Preferred Pharmacy (with phone number or street name):   [facility] - [place], [place] - [address] Phone: [phone]  Fax: [phone]      Has the patient been seen for an appointment in the last year OR does the patient have an upcoming appointment? Yes.    Agent: Please be advised that RX refills may take up to 3 business days. We ask that you follow-up with your pharmacy.

## 2023-03-08 NOTE — Telephone Encounter (Signed)
Requested Prescriptions  Refused Prescriptions Disp Refills   apixaban (ELIQUIS) 5 MG TABS tablet 60 tablet 0    Sig: Take 1 tablet (5 mg total) by mouth 2 (two) times daily.     Hematology:  Anticoagulants - apixaban Failed - 03/08/2023 11:26 AM      Failed - Cr in normal range and within 360 days    Creatinine, Ser  Date Value Ref Range Status  02/09/2023 0.68 (L) 0.76 - 1.27 mg/dL Final         Passed - PLT in normal range and within 360 days    Platelets  Date Value Ref Range Status  01/15/2023 206 150 - 450 x10E3/uL Final         Passed - HGB in normal range and within 360 days    Hemoglobin  Date Value Ref Range Status  01/15/2023 14.3 13.0 - 17.7 g/dL Final         Passed - HCT in normal range and within 360 days    Hematocrit  Date Value Ref Range Status  01/15/2023 42.6 37.5 - 51.0 % Final         Passed - AST in normal range and within 360 days    AST  Date Value Ref Range Status  02/09/2023 12 0 - 40 IU/L Final         Passed - ALT in normal range and within 360 days    ALT  Date Value Ref Range Status  02/09/2023 3 0 - 44 IU/L Final         Passed - Valid encounter within last 12 months    Recent Outpatient Visits           3 weeks ago Edema, unspecified type   [facility] Malva Limes, MD   1 month ago Gastroesophageal reflux disease without esophagitis   [facility] Malva Limes, MD   1 year ago Paraplegia [facility])   [facility] Malva Limes, MD   2 years ago Encounter for Medicare annual wellness exam   [facility] Malva Limes, MD   2 years ago Olecranon bursitis of left elbow   [facility] Malva Limes, MD

## 2023-03-19 ENCOUNTER — Ambulatory Visit (INDEPENDENT_AMBULATORY_CARE_PROVIDER_SITE_OTHER): Payer: Medicare Other | Admitting: Vascular Surgery

## 2023-03-19 ENCOUNTER — Encounter (INDEPENDENT_AMBULATORY_CARE_PROVIDER_SITE_OTHER): Payer: Self-pay | Admitting: Vascular Surgery

## 2023-03-19 VITALS — BP 143/71 | HR 85 | Resp 16

## 2023-03-19 DIAGNOSIS — K219 Gastro-esophageal reflux disease without esophagitis: Secondary | ICD-10-CM | POA: Diagnosis not present

## 2023-03-19 DIAGNOSIS — I82512 Chronic embolism and thrombosis of left femoral vein: Secondary | ICD-10-CM | POA: Diagnosis not present

## 2023-03-19 DIAGNOSIS — J449 Chronic obstructive pulmonary disease, unspecified: Secondary | ICD-10-CM | POA: Insufficient documentation

## 2023-03-19 NOTE — Progress Notes (Signed)
MRN : [id number]  Lee Clark is a 67 y.o. ([birth year]) male who presents with chief complaint of legs hurt and swell.  History of Present Illness:   The patient presents to the office for evaluation of chronic DVT.  Chronic DVT was identified at [facility] by Duplex ultrasound dated 02/15/2023.  It should be noted that when I personally reviewed the ultrasound from both 02/15/2023 and compared to the ultrasound of 11/25/2014 there was no significant clinical change.  The initial symptoms were pain and swelling in the lower extremity.  The patient notes the affected leg is painful with dependency and swells.  Symptoms are much better with elevation.  The patient notes minimal edema in the morning which steadily worsens throughout the day.    The patient has not been using compression therapy at this point.  Patient is on Eliquis  No SOB or pleuritic chest pains.  No cough or hemoptysis.  No blood per rectum or blood in any sputum.  No excessive bruising per the patient.   No recent shortening of the patient's walking distance or new symptoms consistent with claudication.  No history of rest pain symptoms. No new ulcers or wounds of the lower extremities have occurred.  The patient denies amaurosis fugax or recent TIA symptoms. There are no recent neurological changes noted. No recent episodes of angina or shortness of breath documented.   Current Meds  Medication Sig   apixaban (ELIQUIS) 5 MG TABS tablet Take 1 tablet (5 mg total) by mouth 2 (two) times daily.   cholestyramine light (PREVALITE) 4 GM/DOSE powder Take (4 g total) by mouth 4 (four) times daily as needed. Mixed with water   hyoscyamine (LEVBID) 0.375 MG 12 hr tablet Take 1 tablet (0.375 mg total) by mouth 2 (two) times daily.   ipratropium (ATROVENT) 0.06 % nasal spray Place into the nose.   lipase/protease/amylase (CREON) 36000 UNITS CPEP capsule Take 2 capsules (72,000 Units total) by mouth 3 (three) times daily  with meals. May also take 1 capsule (36,000 Units total) as needed (with snacks).   triamcinolone cream (KENALOG) 0.1 % Apply 1 application. topically daily.    Past Medical History:  Diagnosis Date   Colon polyp 07/14/2015   History of chicken pox    Paraplegia following spinal cord injury (HCC)    partial with minimal movement, sensation from injury at L1 level   Shingles    Stroke [facility])    1970's   Tibial plateau fracture, left 11/26/2014    Past Surgical History:  Procedure Laterality Date   BACK SURGERY     gangrene on bilateral buttoks   BUTTOCK MASS EXCISION     bilateral, due to pressure sore that became gangrene   CATARACT EXTRACTION Right 05/23/2022   [facility]   CHOLECYSTECTOMY     SPINE SURGERY      Social History Social History   Tobacco Use   Smoking status: Every Day    Current packs/day: 0.50    Average packs/day: 0.5 packs/day for 54.8 years (27.4 ttl pk-yrs)    Types: Cigarettes    Start date: 1970   Smokeless tobacco: Never   Tobacco comments:    started smoking age  71 yo 1-2 ppd  Vaping Use   Vaping status: Never Used  Substance Use Topics   Alcohol use: No   Drug use: Not Currently    Types: Marijuana    Family History Family History  Problem Relation  Age of Onset   Cancer Mother    Diabetes Mother    Hypertension Father    Stroke Father    Lupus Sister     Allergies  Allergen Reactions   Ampicillin Hives   Cephalexin     Other reaction(s): Not available   Cephalosporins Other (See Comments)    Pt cannot remember    Sulfa Antibiotics Diarrhea     REVIEW OF SYSTEMS (Negative unless checked)  Constitutional: [] Weight loss  [] Fever  [] Chills Cardiac: [] Chest pain   [] Chest pressure   [] Palpitations   [] Shortness of breath when laying flat   [] Shortness of breath with exertion. Vascular:  [] Pain in legs with walking   [x] Pain in legs at rest  [] History of DVT   [] Phlebitis   [x] Swelling in legs   [] Varicose veins   [] Non-healing  ulcers Pulmonary:   [] Uses home oxygen   [] Productive cough   [] Hemoptysis   [] Wheeze  [] COPD   [] Asthma Neurologic:  [] Dizziness   [] Seizures   [] History of stroke   [] History of TIA  [] Aphasia   [] Vissual changes   [] Weakness or numbness in arm   [] Weakness or numbness in leg Musculoskeletal:   [] Joint swelling   [] Joint pain   [] Low back pain Hematologic:  [] Easy bruising  [] Easy bleeding   [] Hypercoagulable state   [] Anemic Gastrointestinal:  [] Diarrhea   [] Vomiting  [] Gastroesophageal reflux/heartburn   [] Difficulty swallowing. Genitourinary:  [] Chronic kidney disease   [] Difficult urination  [] Frequent urination   [] Blood in urine Skin:  [] Rashes   [] Ulcers  Psychological:  [] History of anxiety   []  History of major depression.  Physical Examination  There were no vitals filed for this visit. There is no height or weight on file to calculate BMI. Gen: WD/WN, NAD Head: [place]/AT, No temporalis wasting.  Ear/Nose/Throat: Hearing grossly intact, nares w/o erythema or drainage, pinna without lesions Eyes: PER, EOMI, sclera nonicteric.  Neck: Supple, no gross masses.  No JVD.  Pulmonary:  Good air movement, no audible wheezing, no use of accessory muscles.  Cardiac: RRR, precordium not hyperdynamic. Vascular:  scattered varicosities present bilaterally.  Moderate venous stasis changes to the legs bilaterally.  1+ soft pitting edema. CEAP [id number]   Vessel Right Left  Radial Palpable Palpable  Gastrointestinal: soft, non-distended. No guarding/no peritoneal signs.  Musculoskeletal: M/S 5/5 throughout.  No deformity.  Neurologic: CN 2-12 intact. Pain and light touch intact in extremities.  Symmetrical.  Speech is fluent. Motor exam as listed above. Psychiatric: Judgment intact, Mood & affect appropriate for pt's clinical situation. Dermatologic: Venous rashes no ulcers noted.  No changes consistent with cellulitis. Lymph : No lichenification or skin changes of chronic lymphedema.  CBC Lab  Results  Component Value Date   WBC 9.6 01/15/2023   HGB 14.3 01/15/2023   HCT 42.6 01/15/2023   MCV 86 01/15/2023   PLT 206 01/15/2023    BMET    Component Value Date/Time   NA 140 02/09/2023 1630   K 3.2 (L) 02/09/2023 1630   CL 103 02/09/2023 1630   CO2 19 (L) 02/09/2023 1630   GLUCOSE 134 (H) 02/09/2023 1630   GLUCOSE 103 (H) 11/25/2014 1440   BUN 10 02/09/2023 1630   CREATININE 0.68 (L) 02/09/2023 1630   CALCIUM 8.4 (L) 02/09/2023 1630   GFRNONAA 114 08/07/2016 1444   GFRAA 132 08/07/2016 1444   CrCl cannot be calculated (Patient's most recent lab result is older than the maximum 21 days allowed.).  COAG No results  found for: "INR", "PROTIME"  Radiology No results found.   Assessment/Plan 1. Chronic deep vein thrombosis (DVT) of left femoral vein (HCC) Recommend:   No surgery or intervention at this point in time.  IVC filter is not indicated at present.  Patient's duplex ultrasound of the venous system shows chronic DVT in the popliteal and femoral veins.  The patient is on anticoagulation and this will continue for life  Elevation was stressed, use of a recliner was discussed.  I have reviewed with the patient DVT and post phlebitic changes such as swelling and why it  causes symptoms such as pain.  I recommended to the patient to wear graduated compression stockings, beginning after three full days of anticoagulation.  Graduated compression should be worn on a daily basis. The patient should wear compression beginning first thing in the morning and removing them in the evening. The patient is instructed specifically not to sleep in the stockings.  In addition, behavioral modification including elevation during the day and avoidance of prolonged dependency will be initiated.    The patient will continue anticoagulation for now as there have not been any problems or complications from anticoagulation therapy at this point.  The patient will follow-up with me with  noninvasive studies as ordered.   2. Gastroesophageal reflux disease, unspecified whether esophagitis present Continue PPI as already ordered, this medication has been reviewed and there are no changes at this time.  Avoidence of caffeine and alcohol  Moderate elevation of the head of the bed   3. Chronic obstructive pulmonary disease, unspecified COPD type (HCC) Continue pulmonary medications and aerosols as already ordered, these medications have been reviewed and there are no changes at this time.     Levora Dredge, MD  03/19/2023 1:12 PM

## 2023-04-02 ENCOUNTER — Other Ambulatory Visit: Payer: Self-pay | Admitting: Family Medicine

## 2023-04-02 DIAGNOSIS — I8290 Acute embolism and thrombosis of unspecified vein: Secondary | ICD-10-CM

## 2023-04-02 MED ORDER — APIXABAN 5 MG PO TABS: 5.0000 mg | ORAL_TABLET | Freq: Two times a day (BID) | ORAL | 1 refills | Status: DC

## 2023-04-02 NOTE — Telephone Encounter (Signed)
Medication Refill -  Most Recent Primary Care Visit:  Provider: Sue Lush  Department: [facility]  Visit Type: MEDICARE AWV, SEQUENTIAL  Date: 02/28/2023  Medication: apixaban (ELIQUIS) 5 MG TABS tablet   Pt is requesting refills be sent in as Dr. Gilda Crease, advised him he must take this now for the rest of his life.  Please advise.   Has the patient contacted their pharmacy? Yes (Agent: If yes, when and what did the pharmacy advise?)  Is this the correct pharmacy for this prescription? Yes If no, delete pharmacy and type the correct one.  This is the patient's preferred pharmacy:   [facility] - [place], [place] - [address] [place] [address] Phone: [phone] Fax: [phone]   Has the prescription been filled recently? Yes  Is the patient out of the medication? No  Has the patient been seen for an appointment in the last year OR does the patient have an upcoming appointment? Yes  Can we respond through MyChart? Yes  Agent: Please be advised that Rx refills may take up to 3 business days. We ask that you follow-up with your pharmacy.

## 2023-04-03 NOTE — Telephone Encounter (Signed)
Request refilled 04/02/23, duplicate request.E-Prescribing Status: Receipt confirmed by pharmacy (04/02/2023  4:47 PM EST).  Requested Prescriptions  Pending Prescriptions Disp Refills   apixaban (ELIQUIS) 5 MG TABS tablet 60 tablet 0    Sig: Take 1 tablet (5 mg total) by mouth 2 (two) times daily.     Hematology:  Anticoagulants - apixaban Failed - 04/02/2023  9:00 AM      Failed - Cr in normal range and within 360 days    Creatinine, Ser  Date Value Ref Range Status  02/09/2023 0.68 (L) 0.76 - 1.27 mg/dL Final         Passed - PLT in normal range and within 360 days    Platelets  Date Value Ref Range Status  01/15/2023 206 150 - 450 x10E3/uL Final         Passed - HGB in normal range and within 360 days    Hemoglobin  Date Value Ref Range Status  01/15/2023 14.3 13.0 - 17.7 g/dL Final         Passed - HCT in normal range and within 360 days    Hematocrit  Date Value Ref Range Status  01/15/2023 42.6 37.5 - 51.0 % Final         Passed - AST in normal range and within 360 days    AST  Date Value Ref Range Status  02/09/2023 12 0 - 40 IU/L Final         Passed - ALT in normal range and within 360 days    ALT  Date Value Ref Range Status  02/09/2023 3 0 - 44 IU/L Final         Passed - Valid encounter within last 12 months    Recent Outpatient Visits           1 month ago Edema, unspecified type   [facility] Malva Limes, MD   2 months ago Gastroesophageal reflux disease without esophagitis   [facility] Malva Limes, MD   1 year ago Paraplegia [facility])   [facility] Malva Limes, MD   2 years ago Encounter for Medicare annual wellness exam   [facility] Malva Limes, MD   2 years ago Olecranon bursitis of left elbow   [facility] Malva Limes, MD

## 2023-04-16 ENCOUNTER — Ambulatory Visit (INDEPENDENT_AMBULATORY_CARE_PROVIDER_SITE_OTHER): Payer: Medicare Other | Admitting: Gastroenterology

## 2023-04-16 VITALS — BP 120/70 | HR 87 | Temp 98.1°F

## 2023-04-16 DIAGNOSIS — K529 Noninfective gastroenteritis and colitis, unspecified: Secondary | ICD-10-CM

## 2023-04-16 DIAGNOSIS — R634 Abnormal weight loss: Secondary | ICD-10-CM

## 2023-04-16 DIAGNOSIS — K8689 Other specified diseases of pancreas: Secondary | ICD-10-CM

## 2023-04-16 DIAGNOSIS — F17210 Nicotine dependence, cigarettes, uncomplicated: Secondary | ICD-10-CM

## 2023-04-16 DIAGNOSIS — F1090 Alcohol use, unspecified, uncomplicated: Secondary | ICD-10-CM

## 2023-04-16 NOTE — Progress Notes (Signed)
Wyline Mood MD, MRCP(U.K) [address]  [address]  [place], [place] [address]  Main: [phone]  Fax: [phone]   Gastroenterology Consultation  Referring Provider:     Malva Limes, MD Primary Care Physician:  Malva Limes, MD Primary Gastroenterologist:  Dr. Wyline Mood  Reason for Consultation:   Chronic diarrhea        HPI:   Lee Clark is a 67 y.o. y/o male referred for consultation & management  by Dr. Sherrie Mustache, Demetrios Isaacs, MD.   He says he is here to see me for chronic diarrhea and leakage at night per rectum.  Going on for a year.  Was more severe earlier but since commencing on Creon has improved significantly.  Lost about 5 to 10 pounds over the past 6 months.  Long-term smoker.  Used to drink alcohol in excess in the past.  No history of pancreatitis.  No family history of pancreatic cancer.  Last colonoscopy was many many years back as per his own words.  Denies any active bleeding.  Was evaluated by Dr Sherrie Mustache for diarrhea and found to have very low pancreatic elastase of 13. Stool for c diff and culture was negative.   Past Medical History:  Diagnosis Date   Colon polyp 07/14/2015   History of chicken pox    Paraplegia following spinal cord injury (HCC)    partial with minimal movement, sensation from injury at L1 level   Shingles    Stroke [facility])    1970's   Tibial plateau fracture, left 11/26/2014    Past Surgical History:  Procedure Laterality Date   BACK SURGERY     gangrene on bilateral buttoks   BUTTOCK MASS EXCISION     bilateral, due to pressure sore that became gangrene   CATARACT EXTRACTION Right 05/23/2022   [facility]   CHOLECYSTECTOMY     SPINE SURGERY      Prior to Admission medications   Medication Sig Start Date End Date Taking? Authorizing Provider  apixaban (ELIQUIS) 5 MG TABS tablet Take 1 tablet (5 mg total) by mouth 2 (two) times daily. 04/02/23   Jacky Kindle, FNP  cholestyramine light (PREVALITE) 4 GM/DOSE powder Take (4  g total) by mouth 4 (four) times daily as needed. Mixed with water 01/15/23   Malva Limes, MD  hyoscyamine (LEVBID) 0.375 MG 12 hr tablet Take 1 tablet (0.375 mg total) by mouth 2 (two) times daily. 01/15/23   Malva Limes, MD  ipratropium (ATROVENT) 0.06 % nasal spray Place into the nose. 01/04/22   [provider]  lipase/protease/amylase (CREON) 36000 UNITS CPEP capsule Take 2 capsules (72,000 Units total) by mouth 3 (three) times daily with meals. May also take 1 capsule (36,000 Units total) as needed (with snacks). 02/23/23   Malva Limes, MD  triamcinolone cream (KENALOG) 0.1 % Apply 1 application. topically daily. 09/15/21   Malva Limes, MD    Family History  Problem Relation Age of Onset   Cancer Mother    Diabetes Mother    Hypertension Father    Stroke Father    Lupus Sister      Social History   Tobacco Use   Smoking status: Every Day    Current packs/day: 0.50    Average packs/day: 0.5 packs/day for 54.9 years (27.4 ttl pk-yrs)    Types: Cigarettes    Start date: 1970   Smokeless tobacco: Never   Tobacco comments:    started  smoking age  58 yo 1-2 ppd  Vaping Use   Vaping status: Never Used  Substance Use Topics   Alcohol use: No   Drug use: Not Currently    Types: Marijuana    Allergies as of 04/16/2023 - Review Complete 04/16/2023  Allergen Reaction Noted   Ampicillin Hives 10/29/2014   Cephalexin  02/20/2022   Cephalosporins Other (See Comments) 10/29/2014   Sulfa antibiotics Diarrhea 07/02/2015    Review of Systems:    All systems reviewed and negative except where noted in HPI.   Physical Exam:  BP 120/70 (BP Location: Left Arm, Patient Position: Sitting, Cuff Size: Normal)   Pulse 87   Temp 98.1 F (36.7 C) (Oral)  No LMP for male patient.  Strong smell of tobacco in the room Psych:  Alert and cooperative. Normal mood and affect. General:   Alert,  Well-developed, well-nourished, pleasant and cooperative in NAD Head:   Normocephalic and atraumatic. Eyes:  Sclera clear, no icterus.   Conjunctiva pink. Ears:  Normal auditory acuity. Psych:  Alert and cooperative. Normal mood and affect.  Imaging Studies: No results found.  Assessment and Plan:   [name] is a 67 y.o. y/o male has been referred for diarrhea fecal elastase is very low suggestive of extrapancreatic insufficiency.  Likely due to effects of prior excess alcohol consumption and cigarette smoking but with weight loss there is a concern for colonic as well as pancreatic neoplasm  Plan 1.  Continue Creon.  Happy to provide refills when he runs out presently he says he has enough 2.  Stool for GI PCR fecal calprotectin celiac serology, hepatic function panel 3.  Colonoscopy to rule out microscopic colitis as well as rule out neoplasm strongly suggested but patient not interested at this point due to bad experience many years back.  I did to call me ASAP if he changed his mind 4.  I also suggested CT scan of the abdomen to particularly look at the pancreas for any neoplasm as well as for effects of chronic pancreatitis he is not keen at this time says he cannot lay flat.  I advised him to call us right away if he changes his mind.  Follow up in as needed  Dr Wyline Mood MD,MRCP(U.K)

## 2023-04-18 LAB — CELIAC DISEASE AB SCREEN W/RFX
Antigliadin Abs, IgA: 6 U (ref 0–19)
IgA/Immunoglobulin A, Serum: 347 mg/dL (ref 61–437)

## 2023-04-25 ENCOUNTER — Telehealth: Payer: Medicare Other | Admitting: Family Medicine

## 2023-04-25 ENCOUNTER — Ambulatory Visit: Payer: Self-pay | Admitting: *Deleted

## 2023-04-25 ENCOUNTER — Telehealth: Payer: Medicare Other | Admitting: Physician Assistant

## 2023-04-25 ENCOUNTER — Encounter: Payer: Self-pay | Admitting: Physician Assistant

## 2023-04-25 DIAGNOSIS — S3981XA Other specified injuries of abdomen, initial encounter: Secondary | ICD-10-CM | POA: Diagnosis not present

## 2023-04-25 DIAGNOSIS — K629 Disease of anus and rectum, unspecified: Secondary | ICD-10-CM

## 2023-04-25 DIAGNOSIS — G8220 Paraplegia, unspecified: Secondary | ICD-10-CM | POA: Diagnosis not present

## 2023-04-25 DIAGNOSIS — R58 Hemorrhage, not elsewhere classified: Secondary | ICD-10-CM

## 2023-04-25 DIAGNOSIS — K6289 Other specified diseases of anus and rectum: Secondary | ICD-10-CM

## 2023-04-25 DIAGNOSIS — S3660XA Unspecified injury of rectum, initial encounter: Secondary | ICD-10-CM

## 2023-04-25 NOTE — Telephone Encounter (Signed)
  Chief Complaint: open wound/sore- possible skin breakdown above rectum Symptoms: open wound- patient pressed this area to help remove stool fron rectum- he is paraplegic. Noticed opening in skin today Frequency: today Pertinent Negatives: Patient denies active bleeding Disposition: [] ED /[] Urgent Care (no appt availability in office) / [x] Appointment(In office/virtual)/ []  [facility]/ [] Home Care/ [] Refused Recommended Disposition /[] [facility]/ []  Follow-up with PCP Additional Notes: Patient states he noticed hole in skin where he applies pressure to move bowels. Patient states there is no bleeding- he is sending a picture in MyChart-. Patient is requesting a nurse to come out for assessment and wound care - patient states he is unable to come to office for appointment today due to transportation- VV appointment set up for patient.

## 2023-04-25 NOTE — Telephone Encounter (Signed)
Reason for Disposition  Looks like a boil or deep ulcer  Answer Assessment - Initial Assessment Questions 1. APPEARANCE of SORES: "What do the sores look like?"     Open hole 2. NUMBER: "How many sores are there?"     1 area 3. SIZE: "How big is the largest sore?"     Small- pencil size 4. LOCATION: "Where are the sores located?"     Above the rectum 5. ONSET: "When did the sores begin?"     This morning with BM 6. TENDER: "Does it hurt when you touch it?"  (Scale 1-10; or mild, moderate, severe)      Unable to feel area due to paraplegia  7. CAUSE: "What do you think is causing the sores?"     Patient was applying pressure to area when having BM 8. OTHER SYMPTOMS: "Do you have any other symptoms?" (e.g., fever, new weakness)     no  Protocols used: Sores-A-AH

## 2023-04-25 NOTE — Progress Notes (Signed)
Because of the area and location of wound, we need to have you seen in person given the location we can not review and look at this virtually. Your condition warrants further evaluation and I recommend that you be seen in a face to face visit at your PCP office and or Urgent Care.    NOTE: There will be NO CHARGE for this eVisit   If you are having a true medical emergency please call 911.

## 2023-04-25 NOTE — Progress Notes (Unsigned)
MyChart Video Visit  Virtual Visit via Video Note   This format is felt to be most appropriate for this patient at this time. Physical exam was limited by quality of the video and audio technology used for the visit.   Patient location: office Patient Location: Home  I discussed the limitations of evaluation and management by telemedicine and the availability of in person appointments. The patient expressed understanding and agreed to proceed.  Patient: Lee Clark   DOB: 1955-10-06   67 y.o. Male  MRN: [id number] Visit Date: 04/25/2023  Today's healthcare provider: Debera Clark, Lee Clark   No chief complaint on file.  Subjective     Discussed the use of AI scribe software for clinical note transcription with the patient, who gave verbal consent to proceed.  History of Present Illness   The patient, a paraplegic, presents with a new wound 'just above my rectum.' He discovered the wound after manually evacuating his bowels. The wound is described as 'about as big around as a Designer, fashion/clothing,' and 'dark looking on the inside.' He denies any discharge from the wound. Due to his paraplegia, he is unable to assess for pain or tenderness. His wife noted mild erythema around the wound. He has been applying antibiotic ointment to the wound and cleaning it after each bowel movement.     Per chart review, "Patient states he noticed hole in skin where he applies pressure to move bowels. Patient states there is no bleeding- he is sending a picture in MyChart-.  Pt was not able to send any picture through mychart before the VV.  Patient is requesting a nurse to come out for assessment and wound care - patient states he is unable to come to office for appointment today due to transportation- VV appointment set up for patient.  "   Medications: Outpatient Medications Prior to Visit  Medication Sig   apixaban (ELIQUIS) 5 MG TABS tablet Take 1 tablet (5 mg total) by mouth 2 (two) times daily.    cholestyramine light (PREVALITE) 4 GM/DOSE powder Take (4 g total) by mouth 4 (four) times daily as needed. Mixed with water   hyoscyamine (LEVBID) 0.375 MG 12 hr tablet Take 1 tablet (0.375 mg total) by mouth 2 (two) times daily.   ipratropium (ATROVENT) 0.06 % nasal spray Place into the nose.   lipase/protease/amylase (CREON) 36000 UNITS CPEP capsule Take 2 capsules (72,000 Units total) by mouth 3 (three) times daily with meals. May also take 1 capsule (36,000 Units total) as needed (with snacks).   triamcinolone cream (KENALOG) 0.1 % Apply 1 application. topically daily.   No facility-administered medications prior to visit.    Review of Systems  Skin:  Positive for wound.   Except see HPI   {Insert previous labs (optional):23779} {See past labs  Heme  Chem  Endocrine  Serology  Results Review (optional):1}   Objective    There were no vitals taken for this visit.  {Insert last BP/Wt (optional):23777}{See vitals history (optional):1}    Physical Exam Constitutional:      General: He is not in acute distress.    Appearance: Normal appearance. He is not diaphoretic.  HENT:     Head: Normocephalic.  Eyes:     Conjunctiva/sclera: Conjunctivae normal.  Pulmonary:     Effort: Pulmonary effort is normal. No respiratory distress.  Neurological:     Mental Status: He is alert and oriented to person, place, and time. Mental status is at baseline.  Assessment & Plan    Assessment and Plan    Perianal Wound Newly noticed wound above the rectum, described as the size of a pencil eraser. No pain due to paraplegia. Mild redness noted by wife. No pus or oozing reported. Hard to the touch. -Request patient to send multiple clear pictures of the wound under good lighting for further assessment. -Advise patient to keep the area clean, especially after bowel movements. -Apply antibiotic ointment or petroleum jelly to keep the area moisturized and prevent  contamination. -Pending review of the pictures, consider referral to surgery if necessary.      No follow-ups on file.     I discussed the assessment and treatment plan with the patient. The patient was provided an opportunity to ask questions and all were answered. The patient agreed with the plan and demonstrated an understanding of the instructions.   The patient was advised to call back or seek an in-person evaluation if the symptoms worsen or if the condition fails to improve as anticipated.  I provided 10 minutes of non-face-to-face time during this encounter.  I, Lee Clark, Lee Clark have reviewed all documentation for this visit. The documentation on  @CurDate @  for the exam, diagnosis, procedures, and orders are all accurate and complete.  Lee Clark, [facility], MMS [facility] [phone] (phone) [phone] (fax)  [facility]

## 2023-04-26 ENCOUNTER — Encounter: Payer: Self-pay | Admitting: Physician Assistant

## 2023-04-29 ENCOUNTER — Other Ambulatory Visit: Payer: Self-pay

## 2023-04-29 ENCOUNTER — Emergency Department
Admission: EM | Admit: 2023-04-29 | Discharge: 2023-04-29 | Disposition: A | Discharge status: Home / Self Care | Payer: Medicare Other | Attending: Emergency Medicine | Admitting: Emergency Medicine

## 2023-04-29 ENCOUNTER — Emergency Department: Payer: Medicare Other

## 2023-04-29 DIAGNOSIS — N400 Enlarged prostate without lower urinary tract symptoms: Secondary | ICD-10-CM | POA: Diagnosis not present

## 2023-04-29 DIAGNOSIS — E876 Hypokalemia: Secondary | ICD-10-CM | POA: Diagnosis not present

## 2023-04-29 DIAGNOSIS — N289 Disorder of kidney and ureter, unspecified: Secondary | ICD-10-CM | POA: Diagnosis not present

## 2023-04-29 DIAGNOSIS — K5909 Other constipation: Secondary | ICD-10-CM | POA: Insufficient documentation

## 2023-04-29 DIAGNOSIS — R197 Diarrhea, unspecified: Secondary | ICD-10-CM | POA: Diagnosis not present

## 2023-04-29 DIAGNOSIS — K838 Other specified diseases of biliary tract: Secondary | ICD-10-CM | POA: Diagnosis not present

## 2023-04-29 DIAGNOSIS — R0902 Hypoxemia: Secondary | ICD-10-CM | POA: Diagnosis not present

## 2023-04-29 LAB — COMPREHENSIVE METABOLIC PANEL
ALT: 6 U/L (ref 0–44)
AST: 14 U/L — ABNORMAL LOW (ref 15–41)
Albumin: 3.9 g/dL (ref 3.5–5.0)
Alkaline Phosphatase: 97 U/L (ref 38–126)
Anion gap: 8 (ref 5–15)
BUN: 15 mg/dL (ref 8–23)
CO2: 25 mmol/L (ref 22–32)
Calcium: 9.1 mg/dL (ref 8.9–10.3)
Chloride: 106 mmol/L (ref 98–111)
Creatinine, Ser: 0.55 mg/dL — ABNORMAL LOW (ref 0.61–1.24)
GFR, Estimated: >60 mL/min (ref >60)
Glucose, Bld: 93 mg/dL (ref 70–99)
Potassium: 3.3 mmol/L — ABNORMAL LOW (ref 3.5–5.1)
Sodium: 139 mmol/L (ref 135–145)
Total Bilirubin: 1.2 mg/dL — ABNORMAL HIGH (ref <1.2)
Total Protein: 7.9 g/dL (ref 6.5–8.1)

## 2023-04-29 LAB — CBC
HCT: 42.6 % (ref 39.0–52.0)
Hemoglobin: 13.9 g/dL (ref 13.0–17.0)
MCH: 30.2 pg (ref 26.0–34.0)
MCHC: 32.6 g/dL (ref 30.0–36.0)
MCV: 92.6 fL (ref 80.0–100.0)
Platelets: 220 10*3/uL (ref 150–400)
RBC: 4.6 MIL/uL (ref 4.22–5.81)
RDW: 14.6 % (ref 11.5–15.5)
WBC: 13.7 10*3/uL — ABNORMAL HIGH (ref 4.0–10.5)
nRBC: 0 % (ref 0.0–0.2)

## 2023-04-29 LAB — LIPASE, BLOOD: Lipase: 20 U/L (ref 11–51)

## 2023-04-29 MED ORDER — POLYETHYLENE GLYCOL 3350 17 G PO PACK: 17.0000 g | PACK | Freq: Every day | ORAL | 0 refills | Status: DC

## 2023-04-29 MED ORDER — SODIUM CHLORIDE 0.9 % IV BOLUS
1000.0000 mL | Freq: Once | INTRAVENOUS | Status: AC
  Administered 2023-04-29: 1000 mL via INTRAVENOUS

## 2023-04-29 MED ORDER — LACTULOSE 20 GM/30ML PO SOLN: 30.0000 mL | Freq: Every day | ORAL | 0 refills | Status: AC

## 2023-04-29 MED ORDER — IOHEXOL 300 MG/ML  SOLN
100.0000 mL | Freq: Once | INTRAMUSCULAR | Status: AC | PRN
  Administered 2023-04-29: 75 mL via INTRAVENOUS

## 2023-04-29 NOTE — ED Triage Notes (Signed)
C/O diarrhea x last several months.  Seen by GI and told that he has cancer.  States diarrhea has been worse over past couple of days.

## 2023-04-29 NOTE — ED Notes (Signed)
Pt stating he uses a wheelchair at home, is unable to walk even using a walker. Pt also stating he is unable to use a bedpan due to pain.

## 2023-04-29 NOTE — ED Triage Notes (Signed)
Pt in via EMS from home with c/o diarrhea for the past month getting worse today. Pt has been seen at [facility] two different ones. One told him he had colon cancer the other told him he had a fistula. Pt has appt tomorrow for the same. Pt is wheelchair bound  98.1 temp, CBG 95, 115/78, HR 91, 99% RA

## 2023-04-29 NOTE — ED Notes (Signed)
Assisted RN Casimiro Needle with changing pt and bed sheets. Heavily soiled with fecal matter.

## 2023-04-29 NOTE — ED Provider Notes (Signed)
[facility] Provider Note    Event Date/Time   First MD Initiated Contact with Patient 04/29/23 1650     (approximate)   History   Diarrhea   HPI  Lee Clark is a 67 y.o. male  who presents to the emergency department today because of concern for continued and worsening diarrhea. The patient says he has been having diarrhea for roughly 1 month. Has seen GI for this. It has been worse over the past few days. He says GI told him he would need a colonoscopy and CT scan. Called GI today and they recommended he come to the emergency department.        Physical Exam   Triage Vital Signs: ED Triage Vitals [04/29/23 1639]  Encounter Vitals Group     BP      Systolic BP Percentile      Diastolic BP Percentile      Pulse      Resp      Temp      Temp src      SpO2      Weight 134 lb 14.7 oz (61.2 kg)     Height      Head Circumference      Peak Flow      Pain Score 0     Pain Loc      Pain Education      Exclude from Growth Chart     Most recent vital signs: There were no vitals filed for this visit.  General: Awake, alert, oriented. CV:  Good peripheral perfusion. Regular rate and rhythm. Resp:  Normal effort. Lungs clear. Abd:  No distention. Diffusely tender to palpation.  ED Results / Procedures / Treatments   Labs (all labs ordered are listed, but only abnormal results are displayed) Labs Reviewed  COMPREHENSIVE METABOLIC PANEL - Abnormal; Notable for the following components:      Result Value   Potassium 3.3 (*)    Creatinine, Ser 0.55 (*)    AST 14 (*)    Total Bilirubin 1.2 (*)    All other components within normal limits  CBC - Abnormal; Notable for the following components:   WBC 13.7 (*)    All other components within normal limits  LIPASE, BLOOD  URINALYSIS, ROUTINE W REFLEX MICROSCOPIC     EKG  None   RADIOLOGY I independently interpreted and visualized the CT abd/pel. My interpretation: Large stool burden  and dilated bowel. Radiology interpretation:  IMPRESSION:  1. Marked stool burden throughout the colon and rectum identified.  Patient's clinical history of diarrhea may reflect overflow  incontinence given the degree of constipation.  2. Mucosal enhancement and wall thickening from the anorectal  junction to the anal verge is identified. Findings are compatible  with proctitis. No discrete fluid collection to suggest abscess.  3. Partially decompressed bladder with diffuse wall thickening.  Along the anterior right bladder wall this measures 2.1 cm. Findings  are favored to represent sequelae of chronic bladder outlet  obstruction or cystitis. Clinical correlation advised.  4. Exophytic cystic lesion off the posterior cortex of the left  kidney measures 1.4 cm and 47 Hounsfield units. Although this may  represent a hemorrhagic or proteinaceous cyst more definitive  characterization with nonemergent outpatient renal protocol CT or  MRI is advised.  5. Prostate gland enlargement.  6. Aortic Atherosclerosis (ICD10-I70.0) and Emphysema (ICD10-J43.9).     PROCEDURES:  Critical Care performed: No    MEDICATIONS ORDERED  IN ED: Medications - No data to display   IMPRESSION / MDM / ASSESSMENT AND PLAN / ED COURSE  I reviewed the triage vital signs and the nursing notes.                              Differential diagnosis includes, but is not limited to, viral infection, functional diarrhea, food poisoning  Patient's presentation is most consistent with acute presentation with potential threat to life or bodily function.  Patient presented to the emergency department today because of concern for worsening of diarrhea that is been going on for about 1 month.  Patient is somewhat tender to his abdomen.  He is neither hypotensive nor tachycardic.  Blood work does show a slight hypokalemia likely secondary to GI loss.  Given tenderness will obtain CT scan.  Did order GI stool sample  studies.  CT scan was consistent with constipation.  At this time I think patient likely suffering from overflow diarrhea and incontinence.  I discussed this with the patient.  Patient was given enema with a large amount of stool output.  He did feel better.  This time I think would be reasonable for patient be discharged home.  Will give patient prescription for lactulose for the next few days.   FINAL CLINICAL IMPRESSION(S) / ED DIAGNOSES   Final diagnoses:  Other constipation     Note:  This document was prepared using Dragon voice recognition software and may include unintentional dictation errors.    Phineas Semen, MD 04/29/23 2041

## 2023-04-29 NOTE — Discharge Instructions (Addendum)
As we discussed please talk to your doctor about further imaging for the cyst seen on your left kidney.

## 2023-04-30 ENCOUNTER — Ambulatory Visit: Payer: Medicare Other | Admitting: Surgery

## 2023-05-02 ENCOUNTER — Ambulatory Visit: Payer: Self-pay

## 2023-05-02 NOTE — Telephone Encounter (Signed)
  Chief Complaint: constipation FU  Symptoms: loose stools, feeling reflux or like food not passing Frequency: since ED visit on 04/29/23 Pertinent Negatives: Patient denies abdominal pain or vomiting  Disposition: [] ED /[] Urgent Care (no appt availability in office) / [] Appointment(In office/virtual)/ []  [facility]/ [] Home Care/ [] Refused Recommended Disposition /[] [facility]/ [x]  Follow-up with PCP Additional Notes: pt went to ED on 04/29/23, was given enema and had stool passed then, was sent home on lactulose and Miralax. Pt states he has taken Lactulose x 2 days and not having any sx of hard stools passed. Has had loose stool in the morning. Pt wondering if he can stop lactulose and try Miralax to see if that helps any. Pt also asked about stopping cholestyramine powder. He didn't know if was affecting the bowel meds or not. Pt denies any abdominal pain or vomiting. Advised that he could try to take Miralax instead of Lactulose. Also that I would send message to provider for review and if sx got worse before then or had any vomiting to go to ED. Pt verbalized understanding.   Reason for Disposition  Constipation is a chronic symptom (recurrent or ongoing AND present > 4 weeks)  Answer Assessment - Initial Assessment Questions 1. STOOL PATTERN OR FREQUENCY: "How often do you have a bowel movement (BM)?"  (Normal range: 3 times a day to every 3 days)  "When was your last BM?"       Loose stools  2. STRAINING: "Do you have to strain to have a BM?"      Always have to strain d/t being quadriplegia  4. STOOL COMPOSITION: "Are the stools hard?"      No mushy and loose  6. CHRONIC CONSTIPATION: "Is this a new problem for you?"  If No, ask: "How long have you had this problem?" (days, weeks, months)  9. LAXATIVES: "Have you been using any stool softeners, laxatives, or enemas?"  If Yes, ask "What, how often, and when was the last time?"     Lactulose  12. OTHER SYMPTOMS: "Do  you have any other symptoms?" (e.g., abdomen pain, bloating, fever, vomiting)   Feeling reflux or feeling like food not passing like normal  Protocols used: Constipation-A-AH

## 2023-05-03 NOTE — Telephone Encounter (Signed)
Please review

## 2023-05-07 ENCOUNTER — Other Ambulatory Visit: Payer: Self-pay | Admitting: Family Medicine

## 2023-05-07 DIAGNOSIS — K529 Noninfective gastroenteritis and colitis, unspecified: Secondary | ICD-10-CM

## 2023-05-07 DIAGNOSIS — K580 Irritable bowel syndrome with diarrhea: Secondary | ICD-10-CM

## 2023-05-21 ENCOUNTER — Ambulatory Visit (INDEPENDENT_AMBULATORY_CARE_PROVIDER_SITE_OTHER): Payer: Medicare Other | Admitting: Surgery

## 2023-05-21 ENCOUNTER — Encounter: Payer: Self-pay | Admitting: Surgery

## 2023-05-21 VITALS — BP 150/78 | HR 96 | Temp 98.2°F | Ht 74.0 in | Wt 125.0 lb

## 2023-05-21 DIAGNOSIS — K629 Disease of anus and rectum, unspecified: Secondary | ICD-10-CM | POA: Diagnosis not present

## 2023-05-21 NOTE — Progress Notes (Signed)
05/21/2023  Reason for Visit: Perianal wound  Requesting Provider:  Debera Lat, PA-C  History of Present Illness: Lee Clark is a 67 y.o. male presenting for evaluation of a perianal wound.  Patient is a history of paraplegia following a spinal cord injury as well as history of prior decubitus wounds which required surgical debridement in the past.  He also has a history of pancreatic insufficiency and has had issues with diarrhea.  He was recently seen in the emergency room on 04/29/2023 due to worsening diarrhea and was actually found to have significant stool burden throughout his colon was some inflammatory changes in the anal canal compatible with possible proctitis.  He reports that a few months ago he noticed that wound in the posterior perianal region but denies seeing any drainage or leakage of fluid or stool from that area.  He applies Vaseline to the area for protection due to some fecal incontinence that he has.  Denies any pain in the area and he denies seeing any stool coming through that opening.  Past Medical History: Past Medical History:  Diagnosis Date   Colon polyp 07/14/2015   History of chicken pox    Paraplegia following spinal cord injury (HCC)    partial with minimal movement, sensation from injury at L1 level   Shingles    Stroke [facility])    1970's   Tibial plateau fracture, left 11/26/2014     Past Surgical History: Past Surgical History:  Procedure Laterality Date   BACK SURGERY     gangrene on bilateral buttoks   BUTTOCK MASS EXCISION     bilateral, due to pressure sore that became gangrene   CATARACT EXTRACTION Right 05/23/2022   [facility]   CHOLECYSTECTOMY     SPINE SURGERY      Home Medications: Prior to Admission medications   Medication Sig Start Date End Date Taking? Authorizing Provider  apixaban (ELIQUIS) 5 MG TABS tablet Take 1 tablet (5 mg total) by mouth 2 (two) times daily. 04/02/23  Yes Merita Norton T, FNP  cholestyramine light (PREVALITE)  4 GM/DOSE powder TAKE 4 GRAMS TOTAL BY MOUTH 4 TIMES DAILY. MIX WITH WATER. 05/07/23  Yes Malva Limes, MD  hyoscyamine (LEVBID) 0.375 MG 12 hr tablet Take 1 tablet (0.375 mg total) by mouth 2 (two) times daily. 01/15/23  Yes Malva Limes, MD  ipratropium (ATROVENT) 0.06 % nasal spray Place into the nose. 01/04/22  Yes [provider]  lipase/protease/amylase (CREON) 36000 UNITS CPEP capsule Take 2 capsules (72,000 Units total) by mouth 3 (three) times daily with meals. May also take 1 capsule (36,000 Units total) as needed (with snacks). 02/23/23  Yes Malva Limes, MD  polyethylene glycol (MIRALAX) 17 g packet Take 17 g by mouth daily. Start after finishing lactulose 04/29/23  Yes Phineas Semen, MD  triamcinolone cream (KENALOG) 0.1 % Apply 1 application. topically daily. 09/15/21  Yes Malva Limes, MD    Allergies: Allergies  Allergen Reactions   Ampicillin Hives   Cephalexin     Other reaction(s): Not available   Cephalosporins Other (See Comments)    Pt cannot remember    Sulfa Antibiotics Diarrhea    Social History:  reports that he has been smoking cigarettes. He started smoking about 55 years ago. He has a 27.5 pack-year smoking history. He has never used smokeless tobacco. He reports that he does not currently use drugs after having used the following drugs: Marijuana. He reports that he does not drink  alcohol.   Family History: Family History  Problem Relation Age of Onset   Cancer Mother    Diabetes Mother    Hypertension Father    Stroke Father    Lupus Sister     Review of Systems: Review of Systems  Constitutional:  Negative for chills and fever.  Respiratory:  Negative for shortness of breath.   Cardiovascular:  Negative for chest pain.  Gastrointestinal:  Positive for diarrhea. Negative for nausea and vomiting.    Physical Exam BP (!) 150/78   Pulse 96   Temp 98.2 F (36.8 C)   Ht 6\' 2"  (1.88 m)   Wt 125 lb (56.7 kg)   SpO2 99%    BMI 16.05 kg/m  CONSTITUTIONAL: No acute distress HEENT:  Normocephalic, atraumatic, extraocular motion intact. RESPIRATORY:  Normal respiratory effort without pathologic use of accessory muscles. CARDIOVASCULAR: Regular rhythm and rate. RECTAL:  External exam reveals a 5-7 mm opening on the perianal skin posteriorly.  Probing with qtip does not appear to go deep beyond about 5 mm as well.  On digital rectal exam, he appears to have an area of ulceration also in the posterior distal anal canal that would seem to align with the external wound.  However Q-tip does not probe through from 1 end to the other.  There is no stool like or mucus drainage from the perianal wound. MUSCULOSKELETAL: Patient has paraplegia with decreased sensation/function from the waist down.  Is wheelchair-bound PSYCH:  Alert and oriented to person, place and time. Affect is normal.  Laboratory Analysis: No results found for this or any previous visit (from the past 24 hours).  Imaging: CT abdomen/pelvis on 04/29/2023: IMPRESSION: 1. Marked stool burden throughout the colon and rectum identified. Patient's clinical history of diarrhea may reflect overflow incontinence given the degree of constipation. 2. Mucosal enhancement and wall thickening from the anorectal junction to the anal verge is identified. Findings are compatible with proctitis. No discrete fluid collection to suggest abscess. 3. Partially decompressed bladder with diffuse wall thickening. Along the anterior right bladder wall this measures 2.1 cm. Findings are favored to represent sequelae of chronic bladder outlet obstruction or cystitis. Clinical correlation advised. 4. Exophytic cystic lesion off the posterior cortex of the left kidney measures 1.4 cm and 47 Hounsfield units. Although this may represent a hemorrhagic or proteinaceous cyst more definitive characterization with nonemergent outpatient renal protocol CT or MRI is advised. 5. Prostate gland  enlargement. 6. Aortic Atherosclerosis (ICD10-I70.0) and Emphysema (ICD10-J43.9).  Assessment and Plan: This is a 67 y.o. male with a posterior perianal wound.  - Discussed with the patient that this could have potentially been an ulceration that led to fistula formation in the past.  However currently there does not seem to be a communication truly going through from the anal canal out to the perianal skin.  It may be that this has healed over the course of time.  At this point there is no drainage through the skin opening.  As such, no intervention is needed.  Discussed with the patient that he can continue his bowel regimen maintenance to prevent severe constipation to decrease the risk of any further ulceration at the anal canal level.  He can continue to apply Vaseline into the perianal wound as needed to act as a skin barrier given his fecal incontinence. - Return precautions given.  Follow-up as needed.  I spent 30 minutes dedicated to the care of this patient on the date of this encounter to  include pre-visit review of records, face-to-face time with the patient discussing diagnosis and management, and any post-visit coordination of care.   Howie Ill, MD [facility]

## 2023-05-21 NOTE — Patient Instructions (Signed)
Continue to keep the area clean and dry. Ok to keep putting Vaseline on the area. Call us if anything changes and you start to have any leakage through this area.     Follow-up with our office as needed.  Please call and ask to speak with a nurse if you develop questions or concerns.

## 2023-05-31 DIAGNOSIS — S90122A Contusion of left lesser toe(s) without damage to nail, initial encounter: Secondary | ICD-10-CM | POA: Diagnosis not present

## 2023-06-06 ENCOUNTER — Telehealth: Payer: Medicare Other | Admitting: Physician Assistant

## 2023-06-06 ENCOUNTER — Ambulatory Visit: Payer: Self-pay

## 2023-06-06 DIAGNOSIS — B9689 Other specified bacterial agents as the cause of diseases classified elsewhere: Secondary | ICD-10-CM

## 2023-06-06 DIAGNOSIS — J208 Acute bronchitis due to other specified organisms: Secondary | ICD-10-CM

## 2023-06-06 MED ORDER — ALBUTEROL SULFATE HFA 108 (90 BASE) MCG/ACT IN AERS: 2.0000 | INHALATION_SPRAY | Freq: Four times a day (QID) | RESPIRATORY_TRACT | 0 refills | Status: AC | PRN

## 2023-06-06 MED ORDER — DOXYCYCLINE HYCLATE 100 MG PO TABS: 100.0000 mg | ORAL_TABLET | Freq: Two times a day (BID) | ORAL | 0 refills | Status: DC

## 2023-06-06 MED ORDER — BENZONATATE 100 MG PO CAPS: 100.0000 mg | ORAL_CAPSULE | Freq: Three times a day (TID) | ORAL | 0 refills | Status: DC | PRN

## 2023-06-06 NOTE — Patient Instructions (Addendum)
 Lee Clark, thank you for joining [name], PA-C for today's virtual visit.  While this provider is not your primary care provider (PCP), if your PCP is located in our provider database this encounter information will be shared with them immediately following your visit.   A [facility] MyChart account gives you access to today's visit and all your visits, tests, and labs performed at [facility] " click here if you don't have a [facility] MyChart account or go to [email or web address]  Consent: (Patient) Lee Clark provided verbal consent for this virtual visit at the beginning of the encounter.  Current Medications:  Current Outpatient Medications:    apixaban  (ELIQUIS ) 5 MG TABS tablet, Take 1 tablet (5 mg total) by mouth 2 (two) times daily., Disp: 180 tablet, Rfl: 1   cholestyramine  light (PREVALITE ) 4 GM/DOSE powder, TAKE 4 GRAMS TOTAL BY MOUTH 4 TIMES DAILY. MIX WITH WATER., Disp: 462 g, Rfl: 4   hyoscyamine  (LEVBID) 0.375 MG 12 hr tablet, Take 1 tablet (0.375 mg total) by mouth 2 (two) times daily., Disp: 60 tablet, Rfl: 5   ipratropium (ATROVENT) 0.06 % nasal spray, Place into the nose., Disp: , Rfl:    lipase/protease/amylase (CREON ) 36000 UNITS CPEP capsule, Take 2 capsules (72,000 Units total) by mouth 3 (three) times daily with meals. May also take 1 capsule (36,000 Units total) as needed (with snacks)., Disp: 240 capsule, Rfl: 3   polyethylene glycol (MIRALAX ) 17 g packet, Take 17 g by mouth daily. Start after finishing lactulose , Disp: 14 each, Rfl: 0   triamcinolone  cream (KENALOG ) 0.1 %, Apply 1 application. topically daily., Disp: 80 g, Rfl: 1   Medications ordered in this encounter:  No orders of the defined types were placed in this encounter.    *If you need refills on other medications prior to your next appointment, please contact your pharmacy*  Follow-Up: Call back or seek an in-person evaluation if the symptoms worsen or if  the condition fails to improve as anticipated.  [facility] [phone]  Other Instructions Take antibiotic (Doxycycline ) as directed.  Increase fluids.  Get plenty of rest. Use Mucinex for congestion. Tessalon  as directed. Take a daily probiotic (I recommend Align or Culturelle, but even Activia Yogurt may be beneficial).  A humidifier placed in the bedroom may offer some relief for a dry, scratchy throat of nasal irritation.  Read information below on acute bronchitis. Please call or return to clinic if symptoms are not improving.  Acute Bronchitis Bronchitis is when the airways that extend from the windpipe into the lungs get red, puffy, and painful (inflamed). Bronchitis often causes thick spit (mucus) to develop. This leads to a cough. A cough is the most common symptom of bronchitis. In acute bronchitis, the condition usually begins suddenly and goes away over time (usually in 2 weeks). Smoking, allergies, and asthma can make bronchitis worse. Repeated episodes of bronchitis may cause more lung problems.  HOME CARE Rest. Drink enough fluids to keep your pee (urine) clear or pale yellow (unless you need to limit fluids as told by your doctor). Only take over-the-counter or prescription medicines as told by your doctor. Avoid smoking and secondhand smoke. These can make bronchitis worse. If you are a smoker, think about using nicotine gum or skin patches. Quitting smoking will help your lungs heal faster. Reduce the chance of getting bronchitis again by: Washing your hands often. Avoiding people with cold symptoms. Trying not to touch your hands to  your mouth, nose, or eyes. Follow up with your doctor as told.  GET HELP IF: Your symptoms do not improve after 1 week of treatment. Symptoms include: Cough. Fever. Coughing up thick spit. Body aches. Chest congestion. Chills. Shortness of breath. Sore throat.  GET HELP RIGHT AWAY IF:  You have an increased fever. You  have chills. You have severe shortness of breath. You have bloody thick spit (sputum). You throw up (vomit) often. You lose too much body fluid (dehydration). You have a severe headache. You faint.  MAKE SURE YOU:  Understand these instructions. Will watch your condition. Will get help right away if you are not doing well or get worse. Document Released: 10/25/2007 Document Revised: 01/08/2013 Document Reviewed: 10/29/2012 [facility] Patient Information 2015 [place], [place]. This information is not intended to replace advice given to you by your health care provider. Make sure you discuss any questions you have with your health care provider.   If you have been instructed to have an in-person evaluation today at a local Urgent Care facility, please use the link below. It will take you to a list of all of our available [facility], including address, phone number and hours of operation. Please do not delay care.  [facility]  If you or a family member do not have a primary care provider, use the link below to schedule a visit and establish care. When you choose a [facility] primary care physician or advanced practice provider, you gain a long-term partner in health. Find a Primary Care Provider  Learn more about [facility]'s in-office and virtual care options:  - Get Care Now

## 2023-06-06 NOTE — Progress Notes (Signed)
 Virtual Visit Consent   [name], you are scheduled for a virtual visit with a [facility] provider today. Just as with appointments in the office, your consent must be obtained to participate. Your consent will be active for this visit and any virtual visit you may have with one of our providers in the next 365 days. If you have a MyChart account, a copy of this consent can be sent to you electronically.  As this is a virtual visit, video technology does not allow for your provider to perform a traditional examination. This may limit your provider's ability to fully assess your condition. If your provider identifies any concerns that need to be evaluated in person or the need to arrange testing (such as labs, EKG, etc.), we will make arrangements to do so. Although advances in technology are sophisticated, we cannot ensure that it will always work on either your end or our end. If the connection with a video visit is poor, the visit may have to be switched to a telephone visit. With either a video or telephone visit, we are not always able to ensure that we have a secure connection.  By engaging in this virtual visit, you consent to the provision of healthcare and authorize for your insurance to be billed (if applicable) for the services provided during this visit. Depending on your insurance coverage, you may receive a charge related to this service.  I need to obtain your verbal consent now. Are you willing to proceed with your visit today? Johaan Blend [facility] has provided verbal consent on 06/06/2023 for a virtual visit (video or telephone). Lee Clark, [place]  Date: 06/06/2023 1:46 PM  Virtual Visit via Video Note   I, Lee Clark, connected with  Lee Clark  ([id number], September 16, 1955) on 06/06/23 at  1:45 PM EST by a video-enabled telemedicine application and verified that I am speaking with the correct person using two identifiers.  Location: Patient: Virtual Visit Location  Patient: Home Provider: Virtual Visit Location Provider: Home Office   I discussed the limitations of evaluation and management by telemedicine and the availability of in person appointments. The patient expressed understanding and agreed to proceed.    History of Present Illness: Lee Clark is a 68 y.o. who identifies as a male who was assigned male at birth, and is being seen today for URI symptoms starting about a week ago and worsening symptoms since onset. Initially started with cough and irritation in the throat, worse at night. This has progressed into chest congestion and now a productive cough with thick yellow phlegm. Now with low-grade fever and fatigue. Notes with deep breath this triggers a bad coughing spell. Tmax 101.   OTC -- Tussin DM  HPI: HPI  Problems:  Patient Active Problem List   Diagnosis Date Noted   COPD (chronic obstructive pulmonary disease) (HCC) 03/19/2023   Chronic deep vein thrombosis (DVT) of left femoral vein (HCC) 02/16/2023   Pancreatic insufficiency 02/09/2023   Senile nuclear sclerosis, bilateral 01/19/2022   Paraplegia (HCC) 08/13/2017   Chronic diarrhea 12/28/2016   Smoking greater than 30 pack years 08/02/2015   Clinical depression 07/14/2015   Blood glucose elevated 07/14/2015   Esophageal reflux 07/14/2015   Irritable bowel syndrome 07/14/2015   Vitamin D  deficiency 07/14/2015   Decubitus ulcer 11/26/2014    Allergies:  Allergies  Allergen Reactions   Ampicillin Hives   Cephalexin     Other reaction(s): Not available   Cephalosporins Other (  See Comments)    Pt cannot remember    Sulfa  Antibiotics Diarrhea   Medications:  Current Outpatient Medications:    albuterol  (VENTOLIN  HFA) 108 (90 Base) MCG/ACT inhaler, Inhale 2 puffs into the lungs every 6 (six) hours as needed for wheezing or shortness of breath., Disp: 8 g, Rfl: 0   benzonatate  (TESSALON ) 100 MG capsule, Take 1 capsule (100 mg total) by mouth 3 (three) times daily as  needed for cough., Disp: 30 capsule, Rfl: 0   doxycycline  (VIBRA -TABS) 100 MG tablet, Take 1 tablet (100 mg total) by mouth 2 (two) times daily., Disp: 14 tablet, Rfl: 0   apixaban  (ELIQUIS ) 5 MG TABS tablet, Take 1 tablet (5 mg total) by mouth 2 (two) times daily., Disp: 180 tablet, Rfl: 1   cholestyramine  light (PREVALITE ) 4 GM/DOSE powder, TAKE 4 GRAMS TOTAL BY MOUTH 4 TIMES DAILY. MIX WITH WATER., Disp: 462 g, Rfl: 4   hyoscyamine  (LEVBID) 0.375 MG 12 hr tablet, Take 1 tablet (0.375 mg total) by mouth 2 (two) times daily., Disp: 60 tablet, Rfl: 5   ipratropium (ATROVENT) 0.06 % nasal spray, Place into the nose., Disp: , Rfl:    lipase/protease/amylase (CREON ) 36000 UNITS CPEP capsule, Take 2 capsules (72,000 Units total) by mouth 3 (three) times daily with meals. May also take 1 capsule (36,000 Units total) as needed (with snacks)., Disp: 240 capsule, Rfl: 3   polyethylene glycol (MIRALAX ) 17 g packet, Take 17 g by mouth daily. Start after finishing lactulose , Disp: 14 each, Rfl: 0   triamcinolone  cream (KENALOG ) 0.1 %, Apply 1 application. topically daily., Disp: 80 g, Rfl: 1  Observations/Objective: Patient is well-developed, well-nourished in no acute distress.  Resting comfortably at home.  Head is normocephalic, atraumatic.  No labored breathing. Speech is clear and coherent with logical content.  Patient is alert and oriented at baseline.   Assessment and Plan: 1. Acute bacterial bronchitis (Primary) - benzonatate  (TESSALON ) 100 MG capsule; Take 1 capsule (100 mg total) by mouth 3 (three) times daily as needed for cough.  Dispense: 30 capsule; Refill: 0 - doxycycline  (VIBRA -TABS) 100 MG tablet; Take 1 tablet (100 mg total) by mouth 2 (two) times daily.  Dispense: 14 tablet; Refill: 0 - albuterol  (VENTOLIN  HFA) 108 (90 Base) MCG/ACT inhaler; Inhale 2 puffs into the lungs every 6 (six) hours as needed for wheezing or shortness of breath.  Dispense: 8 g; Refill: 0  Rx Doxycycline .   Increase fluids.  Rest.  Saline nasal spray.  Probiotic.  Mucinex as directed.  Humidifier in bedroom. Tessalon  and albuterol  per orders.  Call or return to clinic if symptoms are not improving.   Follow Up Instructions: I discussed the assessment and treatment plan with the patient. The patient was provided an opportunity to ask questions and all were answered. The patient agreed with the plan and demonstrated an understanding of the instructions.  A copy of instructions were sent to the patient via MyChart unless otherwise noted below.    The patient was advised to call back or seek an in-person evaluation if the symptoms worsen or if the condition fails to improve as anticipated.    Lee Maillard, PA-C

## 2023-06-06 NOTE — Telephone Encounter (Signed)
  Chief Complaint: cough Symptoms: cough with congestion, wheezing, nasal congestion, worse at night  Frequency: 1 week  Pertinent Negatives: Patient denies SOB or fever  Disposition: [] ED /[] Urgent Care (no appt availability in office) / [] Appointment(In office/virtual)/ [x]  [facility]/ [] Home Care/ [] Refused Recommended Disposition /[] [place] [facility]/ []  Follow-up with PCP Additional Notes: pt states he has cough with congestion, worse at night time. Has some wheezing as well throughout night. Has been taking Tussin DM does give some relief. No appts with practice until 06/08/23, scheduled VUC today at 1345. Care advice given and pt verbalized understanding.    Summary: Pt experiencing cough and congestion and would like a nurse to return his call   Pt stated that he is experiencing cough and congestion and would like to see Dr. Shann Darnel on Monday 06/11/23 but there were no appts available. Offered to schedule with another provider but there were appts until 06/19/23. Pt requests to speak with a nurse. Informed pt that a message would be sent requesting that a nurse return his call. Cb# [phone]         Reason for Disposition  [1] Nasal discharge AND [2] present > 10 days  Answer Assessment - Initial Assessment Questions 1. ONSET: "When did the nasal discharge start?"      1 week  3. COUGH: "Do you have a cough?" If Yes, ask: "Describe the color of your sputum" (clear, white, yellow, green)     Yes worse at night  4. RESPIRATORY DISTRESS: "Describe your breathing."      Wheezing  5. FEVER: "Do you have a fever?" If Yes, ask: "What is your temperature, how was it measured, and when did it start?"     no 7. OTHER SYMPTOMS: "Do you have any other symptoms?" (e.g., sore throat, earache, wheezing, vomiting)     Cough with congestion, yellow mucus, nasal congestion  Protocols used: Common Cold-A-AH

## 2023-06-18 ENCOUNTER — Ambulatory Visit: Payer: Self-pay

## 2023-06-18 NOTE — Telephone Encounter (Signed)
Message from [facility] sent at 06/18/2023  8:36 AM EST  Summary: Not feeling any better, seeking assistance. Just had appt   Pt called reporting that he still does not feel any better even after completing the prescription he was given by Dr. Daphine Deutscher (Telehealth)  Head aches, sore throat, congestion, cough keeping him from sleeping at night  Best contact: [phone]         Chief Complaint: severe cough Symptoms: sore throat, yellow nasal discharge, chills and muscle aches Frequency: since early January Pertinent Negatives: Patient denies chest pain, blood in sputum, Disposition: [] ED /[] Urgent Care (no appt availability in office) / [x] Appointment(In office/virtual)/ []  [facility]/ [] Home Care/ [] Refused Recommended Disposition /[] [facility]/ []  Follow-up with PCP Additional Notes: no transportation  Reason for Disposition  SEVERE coughing spells (e.g., whooping sound after coughing, vomiting after coughing)  Answer Assessment - Initial Assessment Questions 1. ONSET: "When did the cough begin?"      Cough spells and keeping up at night  2. SEVERITY: "How bad is the cough today?"      *No Answer* 3. SPUTUM: "Describe the color of your sputum" (none, dry cough; clear, white, yellow, green)     yellow 4. HEMOPTYSIS: "Are you coughing up any blood?" If so ask: "How much?" (flecks, streaks, tablespoons, etc.)     no 5. DIFFICULTY BREATHING: "Are you having difficulty breathing?" If Yes, ask: "How bad is it?" (e.g., mild, moderate, severe)    - MILD: No SOB at rest, mild SOB with walking, speaks normally in sentences, can lie down, no retractions, pulse < 100.    - MODERATE: SOB at rest, SOB with minimal exertion and prefers to sit, cannot lie down flat, speaks in phrases, mild retractions, audible wheezing, pulse 100-120.    - SEVERE: Very SOB at rest, speaks in single words, struggling to breathe, sitting hunched forward, retractions, pulse > 120      No SOB   6. FEVER: "Do you have a fever?" If Yes, ask: "What is your temperature, how was it measured, and when did it start?"     Feels warm chills and aching  7. CARDIAC HISTORY: "Do you have any history of heart disease?" (e.g., heart attack, congestive heart failure)      no 8. LUNG HISTORY: "Do you have any history of lung disease?"  (e.g., pulmonary embolus, asthma, emphysema)     no 9. PE RISK FACTORS: "Do you have a history of blood clots?" (or: recent major surgery, recent prolonged travel, bedridden)     Yes DVT in legs  10. OTHER SYMPTOMS: "Do you have any other symptoms?" (e.g., runny nose, wheezing, chest pain)       Inhalation or deep breath cough more, yellow discharge, sore throat  Protocols used: Cough - Acute Productive-A-AH

## 2023-06-19 ENCOUNTER — Telehealth (INDEPENDENT_AMBULATORY_CARE_PROVIDER_SITE_OTHER): Payer: Medicare Other | Admitting: Family Medicine

## 2023-06-19 DIAGNOSIS — J0110 Acute frontal sinusitis, unspecified: Secondary | ICD-10-CM

## 2023-06-19 DIAGNOSIS — R052 Subacute cough: Secondary | ICD-10-CM

## 2023-06-19 DIAGNOSIS — J329 Chronic sinusitis, unspecified: Secondary | ICD-10-CM | POA: Diagnosis not present

## 2023-06-19 DIAGNOSIS — R053 Chronic cough: Secondary | ICD-10-CM | POA: Diagnosis not present

## 2023-06-19 DIAGNOSIS — B9789 Other viral agents as the cause of diseases classified elsewhere: Secondary | ICD-10-CM

## 2023-06-19 MED ORDER — HYDROCODONE BIT-HOMATROP MBR 5-1.5 MG/5ML PO SOLN: 5.0000 mL | Freq: Three times a day (TID) | ORAL | 0 refills | Status: DC | PRN

## 2023-06-19 MED ORDER — AZITHROMYCIN 250 MG PO TABS: ORAL_TABLET | ORAL | 0 refills | Status: AC

## 2023-06-19 NOTE — Progress Notes (Signed)
MyChart Video Visit    Virtual Visit via Video Note   This format is felt to be most appropriate for this patient at this time. Physical exam was limited by quality of the video and audio technology used for the visit.   Patient location: home Provider location: [facility]  I discussed the limitations of evaluation and management by telemedicine and the availability of in person appointments. The patient expressed understanding and agreed to proceed.  Patient: Lee Clark   DOB: 02/04/1956   68 y.o. Male  MRN: [id number] Visit Date: 06/19/2023  Today's healthcare provider: [name], MD   No chief complaint on file.  Subjective    Discussed the use of AI scribe software for clinical note transcription with the patient, who gave verbal consent to proceed.  History of Present Illness   The patient presents with persistent cough and sinus symptoms. He has been experiencing sinus symptoms, including nasal congestion, sinus pain, and pressure, particularly between his eyes and around his head. He has been coughing up yellow sputum and has chills and a fever of 99.61F as of yesterday. He describes a persistent cough that is worse at night, leading to difficulty sleeping. The cough is accompanied by a 'tickling feeling' in his throat and a tight feeling in his chest when taking a breath, possibly accompanied by wheezing.  He was previously prescribed doxycycline, albuterol, and Tessalon on June 06, 2023 via telehealth visit, but these medications did not alleviate his symptoms. He recalls a past sinus infection for which he was treated with a Z-Pak, which was effective. He has been using a 0.06% bromide nasal solution for nasal congestion.       Medications: Outpatient Medications Prior to Visit  Medication Sig   albuterol (VENTOLIN HFA) 108 (90 Base) MCG/ACT inhaler Inhale 2 puffs into the lungs every 6 (six) hours as needed for wheezing or shortness of breath.   apixaban  (ELIQUIS) 5 MG TABS tablet Take 1 tablet (5 mg total) by mouth 2 (two) times daily.   cholestyramine light (PREVALITE) 4 GM/DOSE powder TAKE 4 GRAMS TOTAL BY MOUTH 4 TIMES DAILY. MIX WITH WATER.   hyoscyamine (LEVBID) 0.375 MG 12 hr tablet Take 1 tablet (0.375 mg total) by mouth 2 (two) times daily.   ipratropium (ATROVENT) 0.06 % nasal spray Place into the nose.   lipase/protease/amylase (CREON) 36000 UNITS CPEP capsule Take 2 capsules (72,000 Units total) by mouth 3 (three) times daily with meals. May also take 1 capsule (36,000 Units total) as needed (with snacks).   polyethylene glycol (MIRALAX) 17 g packet Take 17 g by mouth daily. Start after finishing lactulose   triamcinolone cream (KENALOG) 0.1 % Apply 1 application. topically daily.   [DISCONTINUED] benzonatate (TESSALON) 100 MG capsule Take 1 capsule (100 mg total) by mouth 3 (three) times daily as needed for cough.   [DISCONTINUED] doxycycline (VIBRA-TABS) 100 MG tablet Take 1 tablet (100 mg total) by mouth 2 (two) times daily.   No facility-administered medications prior to visit.    Review of Systems     Objective    There were no vitals taken for this visit.    Physical Exam  Awake, alert, oriented x 3. In no apparent distress      Assessment & Plan        Sinusitis Persistent cough, sinus pressure, and yellow nasal discharge despite a course of doxycycline. Patient has a history of allergies to ampicillin, cefalexin, and cephalosporin. -Discontinue doxycycline. -Prescribe Azithromycin (  Z-Pak) as patient has tolerated this in the past. -Prescribe Hycodan cough syrup to alleviate symptoms, particularly at night.    Call if symptoms change or if not rapidly improving.       I discussed the assessment and treatment plan with the patient. The patient was provided an opportunity to ask questions and all were answered. The patient agreed with the plan and demonstrated an understanding of the instructions.   The  patient was advised to call back or seek an in-person evaluation if the symptoms worsen or if the condition fails to improve as anticipated.  I provided 12 minutes of non-face-to-face time during this encounter.   [name], MD [facility] [phone] (phone) [phone] (fax)  [facility]

## 2023-07-14 ENCOUNTER — Other Ambulatory Visit: Payer: Self-pay | Admitting: Family Medicine

## 2023-07-14 MED ORDER — AZITHROMYCIN 250 MG PO TABS: ORAL_TABLET | ORAL | 0 refills | Status: AC

## 2023-08-13 ENCOUNTER — Telehealth: Payer: Self-pay | Admitting: Family Medicine

## 2023-08-16 ENCOUNTER — Other Ambulatory Visit: Payer: Self-pay | Admitting: Family Medicine

## 2023-08-16 NOTE — Telephone Encounter (Signed)
 Requested medication (s) are due for refill today: Yes  Requested medication (s) are on the active medication list: Yes  Last refill:  02/23/23  Future visit scheduled: Yes  Notes to clinic:  Unable to refill due to no refill protocol for this medication.      Requested Prescriptions  Pending Prescriptions Disp Refills   CREON 36000-114000 units CPEP capsule [Pharmacy Med Name: CREON DR 36,000 UNIT CAPSULE] 300 capsule 2    Sig: TAKE 2 CAPSULES 3 TIMES DAILY WITH MEALS. MAY ALSO TAKE 1 CAPSULE AS NEEDED (WITH SNACKS     Off-Protocol Failed - 08/16/2023  1:57 PM      Failed - Medication not assigned to a protocol, review manually.      Failed - Valid encounter within last 12 months    Recent Outpatient Visits   None

## 2023-08-20 NOTE — Telephone Encounter (Signed)
    Patient called asking if Dr. Sherrie Mustache could get him a note excusing him from [employer].  He states he is mostly bed ridden.  Jury duty April 28th..  he would like this mailed to his home address.        CB@  [phone]

## 2023-08-24 ENCOUNTER — Telehealth: Payer: Self-pay | Admitting: Family Medicine

## 2023-08-24 NOTE — Telephone Encounter (Signed)
 Informed patient that jury duty letter was ready for pick up. He stated that he didn't have any transportation and to place letter in the mail. Letter sent on 08/24/2023.

## 2023-08-24 NOTE — Telephone Encounter (Signed)
 Done, left in nurse box

## 2023-09-06 ENCOUNTER — Other Ambulatory Visit: Payer: Self-pay | Admitting: Family Medicine

## 2023-09-06 DIAGNOSIS — K580 Irritable bowel syndrome with diarrhea: Secondary | ICD-10-CM

## 2023-09-06 DIAGNOSIS — K529 Noninfective gastroenteritis and colitis, unspecified: Secondary | ICD-10-CM

## 2023-10-04 ENCOUNTER — Other Ambulatory Visit: Payer: Self-pay

## 2023-10-04 ENCOUNTER — Telehealth: Payer: Self-pay | Admitting: Family Medicine

## 2023-10-04 DIAGNOSIS — I8290 Acute embolism and thrombosis of unspecified vein: Secondary | ICD-10-CM

## 2023-10-04 MED ORDER — APIXABAN 5 MG PO TABS: 5.0000 mg | ORAL_TABLET | Freq: Two times a day (BID) | ORAL | 0 refills | Status: DC

## 2023-10-04 NOTE — Telephone Encounter (Signed)
 [facility] faxed refill request for the following medications:   apixaban  (ELIQUIS ) 5 MG TABS tablet    Please advise.

## 2023-10-04 NOTE — Telephone Encounter (Signed)
Converted to refill request 

## 2023-10-08 ENCOUNTER — Other Ambulatory Visit: Payer: Self-pay

## 2023-10-08 DIAGNOSIS — I8290 Acute embolism and thrombosis of unspecified vein: Secondary | ICD-10-CM

## 2023-10-08 MED ORDER — APIXABAN 5 MG PO TABS: 5.0000 mg | ORAL_TABLET | Freq: Two times a day (BID) | ORAL | 0 refills | Status: DC

## 2023-10-08 NOTE — Telephone Encounter (Signed)
 Called and spoke with patient, made aware of medication refill was sent in electronically to [facility]. Patient verbalized understanding.

## 2023-10-08 NOTE — Telephone Encounter (Signed)
 Medication refill has been sent in electronically to [facility]. Will call patient to inform.

## 2023-10-08 NOTE — Telephone Encounter (Addendum)
 Received a telephone advice record from after hours service stating that this pt will be out of this medication today and needs to be sent to [facility].  Looks like it was printed instead of sent to the pharmacy.  Please send to pharmacy.  It also stated that the pt would like a call from staff concerning this.

## 2023-10-09 ENCOUNTER — Encounter (INDEPENDENT_AMBULATORY_CARE_PROVIDER_SITE_OTHER): Payer: Self-pay

## 2023-10-22 DIAGNOSIS — J310 Chronic rhinitis: Secondary | ICD-10-CM | POA: Diagnosis not present

## 2023-12-15 ENCOUNTER — Other Ambulatory Visit: Payer: Self-pay | Admitting: Family Medicine

## 2023-12-31 ENCOUNTER — Other Ambulatory Visit: Payer: Self-pay | Admitting: Family Medicine

## 2023-12-31 DIAGNOSIS — K529 Noninfective gastroenteritis and colitis, unspecified: Secondary | ICD-10-CM

## 2023-12-31 DIAGNOSIS — K580 Irritable bowel syndrome with diarrhea: Secondary | ICD-10-CM

## 2024-01-08 ENCOUNTER — Other Ambulatory Visit: Payer: Self-pay | Admitting: Family Medicine

## 2024-01-08 DIAGNOSIS — I8290 Acute embolism and thrombosis of unspecified vein: Secondary | ICD-10-CM

## 2024-01-18 ENCOUNTER — Encounter: Payer: Self-pay | Admitting: Family Medicine

## 2024-01-23 ENCOUNTER — Other Ambulatory Visit: Payer: Self-pay | Admitting: Family Medicine

## 2024-01-23 DIAGNOSIS — K580 Irritable bowel syndrome with diarrhea: Secondary | ICD-10-CM

## 2024-01-28 ENCOUNTER — Ambulatory Visit: Admitting: Family Medicine

## 2024-01-28 ENCOUNTER — Encounter: Payer: Self-pay | Admitting: Family Medicine

## 2024-01-28 VITALS — BP 138/70 | HR 84 | Resp 16

## 2024-01-28 DIAGNOSIS — I82512 Chronic embolism and thrombosis of left femoral vein: Secondary | ICD-10-CM

## 2024-01-28 DIAGNOSIS — I8290 Acute embolism and thrombosis of unspecified vein: Secondary | ICD-10-CM

## 2024-01-28 DIAGNOSIS — K8681 Exocrine pancreatic insufficiency: Secondary | ICD-10-CM

## 2024-01-28 DIAGNOSIS — J449 Chronic obstructive pulmonary disease, unspecified: Secondary | ICD-10-CM | POA: Diagnosis not present

## 2024-01-28 DIAGNOSIS — Z125 Encounter for screening for malignant neoplasm of prostate: Secondary | ICD-10-CM | POA: Diagnosis not present

## 2024-01-28 DIAGNOSIS — Z0000 Encounter for general adult medical examination without abnormal findings: Secondary | ICD-10-CM

## 2024-01-28 DIAGNOSIS — F17210 Nicotine dependence, cigarettes, uncomplicated: Secondary | ICD-10-CM | POA: Diagnosis not present

## 2024-01-28 DIAGNOSIS — K580 Irritable bowel syndrome with diarrhea: Secondary | ICD-10-CM | POA: Diagnosis not present

## 2024-01-28 DIAGNOSIS — K529 Noninfective gastroenteritis and colitis, unspecified: Secondary | ICD-10-CM

## 2024-01-28 DIAGNOSIS — G8220 Paraplegia, unspecified: Secondary | ICD-10-CM

## 2024-01-28 NOTE — Patient Instructions (Signed)
 [name]  Please review the attached list of medications and notify my office if there are any errors.   . Please bring all of your medications to every appointment so we can make sure that our medication list is the same as yours.

## 2024-01-29 ENCOUNTER — Ambulatory Visit: Payer: Self-pay | Admitting: Family Medicine

## 2024-01-29 LAB — COMPREHENSIVE METABOLIC PANEL WITH GFR
ALT: 4 IU/L (ref 0–44)
AST: 12 IU/L (ref 0–40)
Albumin: 4.1 g/dL (ref 3.9–4.9)
Alkaline Phosphatase: 106 IU/L (ref 44–121)
BUN/Creatinine Ratio: 16 (ref 10–24)
BUN: 12 mg/dL (ref 8–27)
Bilirubin Total: 0.7 mg/dL (ref 0.0–1.2)
CO2: 19 mmol/L — ABNORMAL LOW (ref 20–29)
Calcium: 9.1 mg/dL (ref 8.6–10.2)
Chloride: 108 mmol/L — ABNORMAL HIGH (ref 96–106)
Creatinine, Ser: 0.73 mg/dL — ABNORMAL LOW (ref 0.76–1.27)
Globulin, Total: 2.6 g/dL (ref 1.5–4.5)
Glucose: 95 mg/dL (ref 70–99)
Potassium: 3.8 mmol/L (ref 3.5–5.2)
Sodium: 142 mmol/L (ref 134–144)
Total Protein: 6.7 g/dL (ref 6.0–8.5)
eGFR: 100 mL/min/1.73 (ref >59)

## 2024-01-29 LAB — CBC
Hematocrit: 41.1 % (ref 37.5–51.0)
Hemoglobin: 13.8 g/dL (ref 13.0–17.7)
MCH: 30.7 pg (ref 26.6–33.0)
MCHC: 33.6 g/dL (ref 31.5–35.7)
MCV: 91 fL (ref 79–97)
Platelets: 180 x10E3/uL (ref 150–450)
RBC: 4.5 x10E6/uL (ref 4.14–5.80)
RDW: 12.8 % (ref 11.6–15.4)
WBC: 7.5 x10E3/uL (ref 3.4–10.8)

## 2024-01-29 LAB — PSA TOTAL (REFLEX TO FREE): Prostate Specific Ag, Serum: 1.1 ng/mL (ref 0.0–4.0)

## 2024-02-08 ENCOUNTER — Telehealth: Payer: Self-pay | Admitting: Family Medicine

## 2024-02-08 NOTE — Telephone Encounter (Signed)
 Copied from CRM [id number]. Topic: Clinical - Prescription Issue >> Feb 08, 2024  9:51 AM Zebedee SAUNDERS wrote: Reason for CRM: Pt would like a zpak antibiotics sent to: [facility] - [place], [place] - [address] [place] [address] Phone: [phone] Fax: [phone], for his sinus infection. Pt states he needs it in before 12 noon. Please call pt at [phone]

## 2024-02-11 ENCOUNTER — Other Ambulatory Visit: Payer: Self-pay | Admitting: Family Medicine

## 2024-02-11 ENCOUNTER — Ambulatory Visit: Payer: Self-pay

## 2024-02-11 MED ORDER — AZITHROMYCIN 250 MG PO TABS: ORAL_TABLET | ORAL | 0 refills | Status: AC

## 2024-02-11 NOTE — Telephone Encounter (Signed)
 FYI Only or Action Required?: Action required by provider: update on patient condition.  Patient was last seen in primary care on 01/28/2024 by Gasper Nancyann BRAVO, MD.  Called Nurse Triage reporting Sinusitis.  Symptoms began a week ago.  Interventions attempted: Nothing.  Symptoms are: unchanged.  Triage Disposition: See Physician Within 24 Hours  Patient/caregiver understands and will follow disposition?: NoCopied from CRM #[id number]. Topic: Clinical - Red Word Triage >> Feb 11, 2024  3:32 PM Winona R wrote: Head hearts, sinus congestion, coughing and nose draining yellow Phlegm as well. Pt called on Friday request a zpak be sent to the pharmacy however provider has not reviewed the message Reason for Disposition  Fever present > 3 days (72 hours)  Answer Assessment - Initial Assessment Questions Pt called in last Friday and requestted Z-pack from PCP. I don't see any sense in coming this. He has always called it in for me.  I have to request transportation a week out.  Pt declined office visit. Please advise.      1. LOCATION: Where does it hurt?      forehead 2. ONSET: When did the sinus pain start?  (e.g., hours, days)      Last week  3. SEVERITY: How bad is the pain?   (Scale 0-10; or none, mild, moderate or severe)     6 4. RECURRENT SYMPTOM: Have you ever had sinus problems before? If Yes, ask: When was the last time? and What happened that time?      yes 5. NASAL CONGESTION: Is the nose blocked? If Yes, ask: Can you open it or must you breathe through your mouth?     yes 6. NASAL DISCHARGE: Do you have discharge from your nose? If so ask, What color?     yellow 7. FEVER: Do you have a fever? If Yes, ask: What is it, how was it measured, and when did it start?      Slight  8. OTHER SYMPTOMS: Do you have any other symptoms? (e.g., sore throat, cough, earache, difficulty breathing)     Cough-productive, sore throat, SOB due to  coughing  Protocols used: Sinus Pain or Congestion-A-AH

## 2024-02-20 NOTE — Progress Notes (Signed)
 Complete physical exam   Patient: Lee Clark [facility]   DOB: 05/11/1956   68 y.o. Male  MRN: [id number] Visit Date: 01/28/2024  Today's healthcare provider: Nancyann Perry, MD   Chief Complaint  Patient presents with   Annual Exam    Patient reports feeling well. Reports sleeping well. Patient not exercising, on a wheelchair.He does some stretches. Patient declined flu vaccine.   Subjective    Discussed the use of AI scribe software for clinical note transcription with the patient, who gave verbal consent to proceed.  History of Present Illness   Lee Clark is a 68 year old paraplegic male who presents for an annual physical exam.  He has been doing well overall and has not needed to use his inhaler in the past few weeks, only using it when necessary. He takes a blood thinner daily and experiences bruising with minor trauma, but no unusual bleeding. He continues to use cholestyramine , noting that bowel movements vary with diet and can sometimes be loose, depending on what he eats and the timing. He takes Creon , adjusting the dose based on meal size, with three to four capsules for large meals and two to three for snacks.  He has not been successful in reducing smoking. He has tried nicotine patches, which caused skin irritation, and Chantix, which was ineffective.         Past Medical History:  Diagnosis Date   Colon polyp 07/14/2015   History of chicken pox    Paraplegia following spinal cord injury (HCC)    partial with minimal movement, sensation from injury at L1 level   Shingles    Stroke [facility])    1970's   Tibial plateau fracture, left 11/26/2014   Past Surgical History:  Procedure Laterality Date   BACK SURGERY     gangrene on bilateral buttoks   BUTTOCK MASS EXCISION     bilateral, due to pressure sore that became gangrene   CATARACT EXTRACTION Right 05/23/2022   [facility]   CHOLECYSTECTOMY     EYE SURGERY     SPINE SURGERY     Social History   Socioeconomic  History   Marital status: Divorced    Spouse name: Not on file   Number of children: 5   Years of education: Not on file   Highest education level: 9th grade  Occupational History   Not on file  Tobacco Use   Smoking status: Every Day    Current packs/day: 0.50    Average packs/day: 0.5 packs/day for 55.8 years (27.9 ttl pk-yrs)    Types: Cigarettes    Start date: 1970   Smokeless tobacco: Never   Tobacco comments:    started smoking age  65 yo 1-2 ppd  Vaping Use   Vaping status: Never Used  Substance and Sexual Activity   Alcohol use: No   Drug use: Not Currently    Types: Marijuana   Sexual activity: Not Currently    Comment: occasionally  Other Topics Concern   Not on file  Social History Narrative   Not on file   Social Drivers of Health   Financial Resource Strain: Low Risk  (01/26/2024)   Overall Financial Resource Strain (CARDIA)    Difficulty of Paying Living Expenses: Not hard at all  Food Insecurity: No Food Insecurity (01/28/2024)   Hunger Vital Sign    Worried About Running Out of Food in the Last Year: Never true    Ran Out of Food in  the Last Year: Never true  Transportation Needs: No Transportation Needs (01/26/2024)   PRAPARE - Administrator, Civil Service (Medical): No    Lack of Transportation (Non-Medical): No  Physical Activity: Insufficiently Active (01/26/2024)   Exercise Vital Sign    Days of Exercise per Week: 3 days    Minutes of Exercise per Session: 30 min  Stress: No Stress Concern Present (01/26/2024)   [employer] of Occupational Health - Occupational Stress Questionnaire    Feeling of Stress: Not at all  Social Connections: Socially Isolated (01/28/2024)   Social Connection and Isolation Panel    Frequency of Communication with Friends and Family: Three times a week    Frequency of Social Gatherings with Friends and Family: Patient declined    Attends Religious Services: Never    Database administrator or Organizations: No     Attends Banker Meetings: Never    Marital Status: Divorced  Catering manager Violence: Not At Risk (01/28/2024)   Humiliation, Afraid, Rape, and Kick questionnaire    Fear of Current or Ex-Partner: No    Emotionally Abused: No    Physically Abused: No    Sexually Abused: No   Family Status  Relation Name Status   Mother [place] Deceased   Father Yeng Perz [facility] Deceased   Sister  Alive   Sister  Alive  No partnership data on file   Family History  Problem Relation Age of Onset   Cancer Mother    Diabetes Mother    Hypertension Father    Stroke Father    Lupus Sister    Allergies  Allergen Reactions   Ampicillin Hives   Cephalexin     Other reaction(s): Not available   Cephalosporins Other (See Comments)    Pt cannot remember    Sulfa  Antibiotics Diarrhea    Patient Care Team: Gasper Nancyann BRAVO, MD as PCP - General (Family Medicine) Marchia Drivers, MD as Consulting Physician (Orthopedic Surgery) Therisa Bi, MD as Consulting Physician (Gastroenterology) Jama, Cordella MATSU, MD (Vascular Surgery)   Medications: Outpatient Medications Prior to Visit  Medication Sig   albuterol  (VENTOLIN  HFA) 108 (90 Base) MCG/ACT inhaler Inhale 2 puffs into the lungs every 6 (six) hours as needed for wheezing or shortness of breath.   apixaban  (ELIQUIS ) 5 MG TABS tablet Take 1 tablet (5 mg total) by mouth 2 (two) times daily.   cholestyramine  light (PREVALITE ) 4 GM/DOSE powder TAKE 1 SCOOPFUL (4 GRAMS) BY MOUTH 4 TIMES DAILY. MIX WITH WATER.   CREON  36000-114000 units CPEP capsule TAKE 2 CAPSULES 3 TIMES DAILY WITH MEALS. MAY ALSO TAKE 1 CAPSULE AS NEEDED (WITH SNACKS   hyoscyamine  (LEVBID ) 0.375 MG 12 hr tablet Take 1 tablet (0.375 mg total) by mouth 2 (two) times daily.   ipratropium (ATROVENT) 0.06 % nasal spray Place into the nose.   triamcinolone  cream (KENALOG ) 0.1 % Apply 1 application. topically daily.   [DISCONTINUED] HYDROcodone  bit-homatropine  (HYCODAN) 5-1.5 MG/5ML syrup Take 5 mLs by mouth every 8 (eight) hours as needed for cough.   [DISCONTINUED] polyethylene glycol (MIRALAX ) 17 g packet Take 17 g by mouth daily. Start after finishing lactulose    No facility-administered medications prior to visit.    Review of Systems    Objective    BP 138/70 (BP Location: Right Arm, Patient Position: Sitting, Cuff Size: Normal)   Pulse 84   Resp 16   SpO2 99%    Physical Exam   General: Appearance:  Thin male in no sitting in Wheelchair acute distress  Eyes:    PERRL, conjunctiva/corneas clear, EOM's intact       Lungs:     Clear to auscultation bilaterally, respirations unlabored  Heart:    Normal heart rate. Normal rhythm. No murmurs, rubs, or gallops.    MS:   All extremities are intact.  Unable to move legs. Normal Ms of both upper extremiteis.   Neurologic:   Awake, alert, oriented x 3. No apparent focal neurological defect.           Last depression screening scores    01/28/2024    1:50 PM 02/28/2023    3:36 PM 02/09/2023    3:49 PM  PHQ 2/9 Scores  PHQ - 2 Score 0 0 1  PHQ- 9 Score   7   Last fall risk screening    01/28/2024    1:49 PM  Fall Risk   Falls in the past year? 0  Number falls in past yr: 0  Injury with Fall? 0  Risk for fall due to : No Fall Risks   Last Audit-C alcohol use screening    01/26/2024    2:25 PM  Alcohol Use Disorder Test (AUDIT)  1. How often do you have a drink containing alcohol? 0  3. How often do you have six or more drinks on one occasion? 0   A score of 3 or more in women, and 4 or more in men indicates increased risk for alcohol abuse, EXCEPT if all of the points are from question 1     Assessment & Plan    Routine Health Maintenance and Physical Exam  Exercise Activities and Dietary recommendations  Goals   None      There is no immunization history on file for this patient.  Health Maintenance  Topic Date Due   COVID-19 Vaccine (1) Never done   Zoster  Vaccines- Shingrix (1 of 2) Never done   Medicare Annual Wellness (AWV)  02/28/2024   Lung Cancer Screening  02/28/2024 (Originally 12/16/2006)   DTaP/Tdap/Td (1 - Tdap) 02/28/2024 (Originally 12/16/1975)   Pneumococcal Vaccine: 50+ Years (1 of 2 - PCV) 02/28/2024 (Originally 12/16/1975)   Colonoscopy  02/28/2024 (Originally 01/09/2008)   Influenza Vaccine  08/19/2024 (Originally 12/21/2023)   Hepatitis C Screening  Completed   HPV VACCINES  Aged Out   Meningococcal B Vaccine  Aged Out    Discussed health benefits of physical activity, and encouraged him to engage in regular exercise appropriate for his age and condition.   2. Prostate cancer screening  - PSA Total (Reflex To Free)  3. Irritable bowel syndrome with diarrhea  - Comprehensive metabolic panel with GFR  4. Chronic diarrhea Well controlled on cholestyramine .   5. Exocrine pancreatic insufficiency Continue pancreatic enzymes.   6. Paraplegia (HCC) Well compensated.   7. Chronic obstructive pulmonary disease, unspecified COPD type (HCC) Stable, encourage smoking cessation. Occasional use of albuterol  is effective.   8. Chronic deep vein thrombosis (DVT) of left femoral vein (HCC) On lifelong DOAC.   9. Smoking greater than 30 pack years Recommended LDCT lung cancer screening which he declined.       Return in about 1 year (around 01/27/2025) for Yearly Physical.        Nancyann Perry, MD  [facility] [phone] (phone) [phone] (fax)  [facility]

## 2024-03-05 ENCOUNTER — Ambulatory Visit (INDEPENDENT_AMBULATORY_CARE_PROVIDER_SITE_OTHER): Payer: Self-pay | Admitting: Emergency Medicine

## 2024-03-05 VITALS — Ht 73.0 in | Wt 135.0 lb

## 2024-03-05 DIAGNOSIS — Z0000 Encounter for general adult medical examination without abnormal findings: Secondary | ICD-10-CM

## 2024-03-05 NOTE — Progress Notes (Signed)
 Subjective:   Lee Clark is a 68 y.o. who presents for a Medicare Wellness preventive visit.  As a reminder, Annual Wellness Visits don't include a physical exam, and some assessments may be limited, especially if this visit is performed virtually. We may recommend an in-person follow-up visit with your provider if needed.  Visit Complete: Virtual I connected with  Debby LELON [facility] on 03/05/24 by a audio enabled telemedicine application and verified that I am speaking with the correct person using two identifiers.  Patient Location: Home  Provider Location: Home Office  I discussed the limitations of evaluation and management by telemedicine. The patient expressed understanding and agreed to proceed.  Vital Signs: Because this visit was a virtual/telehealth visit, some criteria may be missing or patient reported. Any vitals not documented were not able to be obtained and vitals that have been documented are patient reported.  VideoDeclined- This patient declined Librarian, academic. Therefore the visit was completed with audio only.  Persons Participating in Visit: Patient.  AWV Questionnaire: Yes: Patient Medicare AWV questionnaire was completed by the patient on 03/01/24; I have confirmed that all information answered by patient is correct and no changes since this date.  Cardiac Risk Factors include: advanced age (>59men, >47 women);male gender;smoking/ tobacco exposure;sedentary lifestyle     Objective:    Today's Vitals   03/05/24 1431 03/05/24 1432  Weight: 135 lb (61.2 kg)   Height: 6' 1 (1.854 m)   PainSc:  8    Body mass index is 17.81 kg/m.     03/05/2024    2:46 PM 04/29/2023    4:40 PM 02/28/2023    3:37 PM 02/20/2022    3:36 PM 09/22/2019    2:45 PM 09/16/2018    1:35 PM 08/07/2016    2:12 PM  Advanced Directives  Does Patient Have a Medical Advance Directive? No No Yes Yes No No No   Type of Advance Directive   Living  will;Healthcare Power of [employer] Power of [place];Living will     Does patient want to make changes to medical advance directive? No - Patient declined   No - Patient declined     Copy of Healthcare Power of Attorney in Chart?    Yes - validated most recent copy scanned in chart (See row information)     Would patient like information on creating a medical advance directive? Yes (MAU/Ambulatory/Procedural Areas - Information given) No - Patient declined   No - Patient declined No - Guardian declined       Data saved with a previous flowsheet row definition    Current Medications (verified) Outpatient Encounter Medications as of 03/05/2024  Medication Sig   albuterol  (VENTOLIN  HFA) 108 (90 Base) MCG/ACT inhaler Inhale 2 puffs into the lungs every 6 (six) hours as needed for wheezing or shortness of breath.   apixaban  (ELIQUIS ) 5 MG TABS tablet Take 1 tablet (5 mg total) by mouth 2 (two) times daily.   cholestyramine  light (PREVALITE ) 4 GM/DOSE powder TAKE 1 SCOOPFUL (4 GRAMS) BY MOUTH 4 TIMES DAILY. MIX WITH WATER.   CREON  36000-114000 units CPEP capsule TAKE 2 CAPSULES 3 TIMES DAILY WITH MEALS. MAY ALSO TAKE 1 CAPSULE AS NEEDED (WITH SNACKS (Patient taking differently: Taking 3-4 tablets with meals (depending on size of meal) and 2 tablets with a snack)   hyoscyamine  (LEVBID ) 0.375 MG 12 hr tablet Take 1 tablet (0.375 mg total) by mouth 2 (two) times daily.   ipratropium (ATROVENT)  0.06 % nasal spray Place into the nose.   triamcinolone  cream (KENALOG ) 0.1 % Apply 1 application. topically daily.   No facility-administered encounter medications on file as of 03/05/2024.    Allergies (verified) Ampicillin, Cephalexin, Cephalosporins, and Sulfa  antibiotics   History: Past Medical History:  Diagnosis Date   Colon polyp 07/14/2015   COPD (chronic obstructive pulmonary disease) (HCC)    History of chicken pox    Neuromuscular disorder (HCC)    Don't remember   Paraplegia  following spinal cord injury (HCC)    partial with minimal movement, sensation from injury at L1 level   Shingles    Stroke [facility])    1970's   Tibial plateau fracture, left 11/26/2014   Past Surgical History:  Procedure Laterality Date   BACK SURGERY     gangrene on bilateral buttoks   BUTTOCK MASS EXCISION     bilateral, due to pressure sore that became gangrene   CATARACT EXTRACTION Right 05/23/2022   [facility]   CHOLECYSTECTOMY     EYE SURGERY     SPINE SURGERY     Family History  Problem Relation Age of Onset   Cancer Mother    Diabetes Mother    Hypertension Father    Stroke Father    Lupus Sister    Social History   Socioeconomic History   Marital status: Significant Other    Spouse name: Not on file   Number of children: 6   Years of education: Not on file   Highest education level: 9th grade  Occupational History   Not on file  Tobacco Use   Smoking status: Every Day    Current packs/day: 1.00    Average packs/day: 1 pack/day for 54.8 years (54.8 ttl pk-yrs)    Types: Cigarettes    Start date: 65   Smokeless tobacco: Never   Tobacco comments:    started smoking age  56 yo 1-2 ppd    03/05/24 smoking 1 to 1.5 ppd  Vaping Use   Vaping status: Never Used  Substance and Sexual Activity   Alcohol use: No   Drug use: Yes    Types: Marijuana    Comment: 1-2 times per week   Sexual activity: Not Currently    Birth control/protection: None    Comment: occasionally  Other Topics Concern   Not on file  Social History Narrative   03/05/24 6 biological children, 1 daughter deceased (suicide), 1 daughter given up for adoption by mother/pbt   Social Drivers of Health   Financial Resource Strain: Low Risk  (03/05/2024)   Overall Financial Resource Strain (CARDIA)    Difficulty of Paying Living Expenses: Not hard at all  Food Insecurity: No Food Insecurity (03/05/2024)   Hunger Vital Sign    Worried About Running Out of Food in the Last Year: Never true    Ran  Out of Food in the Last Year: Never true  Transportation Needs: No Transportation Needs (03/05/2024)   PRAPARE - Administrator, Civil Service (Medical): No    Lack of Transportation (Non-Medical): No  Physical Activity: Inactive (03/05/2024)   Exercise Vital Sign    Days of Exercise per Week: 0 days    Minutes of Exercise per Session: 0 min  Stress: No Stress Concern Present (03/05/2024)   [employer] of Occupational Health - Occupational Stress Questionnaire    Feeling of Stress: Not at all  Social Connections: Moderately Isolated (03/05/2024)   Social Connection and Isolation Panel  Frequency of Communication with Friends and Family: More than three times a week    Frequency of Social Gatherings with Friends and Family: Three times a week    Attends Religious Services: Never    Active Member of Clubs or Organizations: No    Attends Banker Meetings: Never    Marital Status: Living with partner    Tobacco Counseling Ready to quit: No Counseling given: Not Answered Tobacco comments: started smoking age  55 yo 1-2 ppd 03/05/24 smoking 1 to 1.5 ppd    Clinical Intake:  Pre-visit preparation completed: Yes  Pain : 0-10 Pain Score: 8  Pain Type: Chronic pain Pain Location: Leg Pain Orientation: Right, Left Pain Descriptors / Indicators: Burning     BMI - recorded: 17.81 Nutritional Status: BMI <19  Underweight Nutritional Risks: None Diabetes: No  Lab Results  Component Value Date   HGBA1C 5.6 01/15/2023     How often do you need to have someone help you when you read instructions, pamphlets, or other written materials from your doctor or pharmacy?: 1 - Never  Interpreter Needed?: No  Information entered by :: Vina Ned, CMA   Activities of Daily Living     03/05/2024    2:35 PM 03/01/2024    5:27 PM  In your present state of health, do you have any difficulty performing the following activities:  Hearing? 0 0  Vision?  0 0  Difficulty concentrating or making decisions? 0 0  Walking or climbing stairs? 1 1  Comment parapalegia, in wheelchair   Dressing or bathing? 0 0  Doing errands, shopping? 1 0  Comment doesn't drive, uses [facility] to get to appointments   Preparing Food and eating ? N N  Using the Toilet? N N  In the past six months, have you accidently leaked urine? CINDERELLA CINDERELLA  Comment wears male guards and uses bed pads   Do you have problems with loss of bowel control? CINDERELLA CINDERELLA  Comment wears male guards and uses bed pads   Managing your Medications? N N  Managing your Finances? N N  Housekeeping or managing your Housekeeping? N N    Patient Care Team: Gasper Nancyann BRAVO, MD as PCP - General (Family Medicine) Laurice Francis NOVAK, OD (Optometry) Edda Mt, MD (Otolaryngology)  I have updated your Care Teams any recent Medical Services you may have received from other providers in the past year.     Assessment:   This is a routine wellness examination for [facility].  Hearing/Vision screen Hearing Screening - Comments:: Denies hearing loss  Vision Screening - Comments:: Gets routine eye exams, Dr. Francis Laurice, [place]   Goals Addressed               This Visit's Progress     DIET - INCREASE WATER INTAKE (pt-stated)        And be more active       Depression Screen     03/05/2024    2:44 PM 01/28/2024    1:50 PM 02/28/2023    3:36 PM 02/09/2023    3:49 PM 01/15/2023    1:46 PM 02/20/2022    3:35 PM 02/16/2021    5:19 PM  PHQ 2/9 Scores  PHQ - 2 Score 0 0 0 1 6 0 0  PHQ- 9 Score 0   7 17 0     Fall Risk     03/05/2024    2:50 PM 03/01/2024    5:27 PM  01/28/2024    1:49 PM 02/27/2023    4:26 PM 01/15/2023    1:46 PM  Fall Risk   Falls in the past year? 0 0 0 0 0  Number falls in past yr: 0  0 0   Injury with Fall? 0  0 0 0  Risk for fall due to : Orthopedic patient;Impaired mobility  No Fall Risks No Fall Risks No Fall Risks  Follow up Falls evaluation completed;Education  provided   Falls prevention discussed;Education provided Falls evaluation completed    MEDICARE RISK AT HOME:  Medicare Risk at Home Any stairs in or around the home?: No If so, are there any without handrails?: No Home free of loose throw rugs in walkways, pet beds, electrical cords, etc?: Yes Adequate lighting in your home to reduce risk of falls?: Yes Life alert?: No Use of a cane, walker or w/c?: Yes (wheelchair) Grab bars in the bathroom?: No Shower chair or bench in shower?: No Elevated toilet seat or a handicapped toilet?: No  TIMED UP AND GO:  Was the test performed?  No  Cognitive Function: 6CIT completed        03/05/2024    2:51 PM 02/28/2023    3:47 PM 02/16/2021    5:24 PM 09/22/2019    2:52 PM 09/16/2018    1:41 PM  6CIT Screen  What Year? 0 points 0 points 0 points 0 points 0 points  What month? 0 points 0 points 0 points 0 points 0 points  What time? 0 points 0 points 0 points 0 points 0 points  Count back from 20 0 points 0 points 0 points 0 points 0 points  Months in reverse 0 points 0 points 0 points 0 points 2 points  Repeat phrase 0 points 0 points 0 points 0 points 0 points  Total Score 0 points 0 points 0 points 0 points 2 points    Immunizations  There is no immunization history on file for this patient.  Screening Tests Health Maintenance  Topic Date Due   COVID-19 Vaccine (1) Never done   DTaP/Tdap/Td (1 - Tdap) Never done   Pneumococcal Vaccine: 50+ Years (1 of 2 - PCV) Never done   Zoster Vaccines- Shingrix (1 of 2) Never done   Lung Cancer Screening  Never done   Colonoscopy  01/09/2008   Influenza Vaccine  08/19/2024 (Originally 12/21/2023)   Medicare Annual Wellness (AWV)  03/05/2025   Hepatitis C Screening  Completed   Meningococcal B Vaccine  Aged Out    Health Maintenance Items Addressed: See Nurse Notes at the end of this note  Additional Screening:  Vision Screening: Recommended annual ophthalmology exams for early detection  of glaucoma and other disorders of the eye. Is the patient up to date with their annual eye exam?  Yes  Who is the provider or what is the name of the office in which the patient attends annual eye exams? Dr. Francis Mallick,  [place]  Dental Screening: Recommended annual dental exams for proper oral hygiene  Community Resource Referral / Chronic Care Management: CRR required this visit?  No   CCM required this visit?  No   Plan:    I have personally reviewed and noted the following in the patient's chart:   Medical and social history Use of alcohol, tobacco or illicit drugs  Current medications and supplements including opioid prescriptions. Patient is not currently taking opioid prescriptions. Functional ability and status Nutritional status Physical activity Advanced directives List  of other physicians Hospitalizations, surgeries, and ER visits in previous 12 months Vitals Screenings to include cognitive, depression, and falls Referrals and appointments  In addition, I have reviewed and discussed with patient certain preventive protocols, quality metrics, and best practice recommendations. A written personalized care plan for preventive services as well as general preventive health recommendations were provided to patient.   Shamal Stracener, CMA   03/05/2024   After Visit Summary: (Mail) Due to this being a telephonic visit, the after visit summary with patients personalized plan was offered to patient via mail   Notes:  Declined screening colonoscopy Declined lung cancer screening Declined all vaccines

## 2024-03-05 NOTE — Patient Instructions (Signed)
 Lee Clark,  Thank you for taking the time for your Medicare Wellness Visit. I appreciate your continued commitment to your health goals. Please review the care plan we discussed, and feel free to reach out if I can assist you further.  Medicare recommends these wellness visits once per year to help you and your care team stay ahead of potential health issues. These visits are designed to focus on prevention, allowing your provider to concentrate on managing your acute and chronic conditions during your regular appointments.  Please note that Annual Wellness Visits do not include a physical exam. Some assessments may be limited, especially if the visit was conducted virtually. If needed, we may recommend a separate in-person follow-up with your provider.  Ongoing Care Seeing your primary care provider every 3 to 6 months helps us  monitor your health and provide consistent, personalized care.   Referrals If a referral was made during today's visit and you haven't received any updates within two weeks, please contact the referred provider directly to check on the status.  Recommended Screenings:  Health Maintenance  Topic Date Due   COVID-19 Vaccine (1) Never done   DTaP/Tdap/Td vaccine (1 - Tdap) Never done   Pneumococcal Vaccine for age over 62 (1 of 2 - PCV) Never done   Zoster (Shingles) Vaccine (1 of 2) Never done   Screening for Lung Cancer  Never done   Colon Cancer Screening  01/09/2008   Flu Shot  08/19/2024*   Medicare Annual Wellness Visit  03/05/2025   Hepatitis C Screening  Completed   Meningitis B Vaccine  Aged Out  *Topic was postponed. The date shown is not the original due date.       03/05/2024    2:46 PM  Advanced Directives  Does Patient Have a Medical Advance Directive? No  Does patient want to make changes to medical advance directive? No - Patient declined  Would patient like information on creating a medical advance directive? Yes (MAU/Ambulatory/Procedural  Areas - Information given)   Advance Care Planning is important because it: Ensures you receive medical care that aligns with your values, goals, and preferences. Provides guidance to your family and loved ones, reducing the emotional burden of decision-making during critical moments. Information on Advanced Care Planning can be found at [place]  Secretary of [facility] Advance Health Care Directives Advance Health Care Directives ([email or web address].[email or web address]/)  You may also get the forms at your doctor's office  Vision: Annual vision screenings are recommended for early detection of glaucoma, cataracts, and diabetic retinopathy. These exams can also reveal signs of chronic conditions such as diabetes and high blood pressure.  Dental: Annual dental screenings help detect early signs of oral cancer, gum disease, and other conditions linked to overall health, including heart disease and diabetes.  Please see the attached documents for additional preventive care recommendations.   Fall Prevention in the Home, Adult Falls can cause injuries and affect people of all ages. There are many simple things that you can do to make your home safe and to help prevent falls. If you need it, ask for help making these changes. What actions can I take to prevent falls? General information Use good lighting in all rooms. Make sure to: Replace any light bulbs that burn out. Turn on lights if it is dark and use night-lights. Keep items that you use often in easy-to-reach places. Lower the shelves around your home if needed. Move furniture so that there are clear paths around it. Do not keep  throw rugs or other things on the floor that can make you trip. If any of your floors are uneven, fix them. Add color or contrast paint or tape to clearly mark and help you see: Grab bars or handrails. First and last steps of staircases. Where the edge of each step is. If you use a ladder or stepladder: Make sure that it is fully opened.  Do not climb a closed ladder. Make sure the sides of the ladder are locked in place. Have someone hold the ladder while you use it. Know where your pets are as you move through your home. What can I do in the bathroom?     Keep the floor dry. Clean up any water that is on the floor right away. Remove soap buildup in the bathtub or shower. Buildup makes bathtubs and showers slippery. Use non-skid mats or decals on the floor of the bathtub or shower. Attach bath mats securely with double-sided, non-slip rug tape. If you need to sit down while you are in the shower, use a non-slip stool. Install grab bars by the toilet and in the bathtub and shower. Do not use towel bars as grab bars. What can I do in the bedroom? Make sure that you have a light by your bed that is easy to reach. Do not use any sheets or blankets on your bed that hang to the floor. Have a firm bench or chair with side arms that you can use for support when you get dressed. What can I do in the kitchen? Clean up any spills right away. If you need to reach something above you, use a sturdy step stool that has a grab bar. Keep electrical cables out of the way. Do not use floor polish or wax that makes floors slippery. What can I do with my stairs? Do not leave anything on the stairs. Make sure that you have a light switch at the top and the bottom of the stairs. Have them installed if you do not have them. Make sure that there are handrails on both sides of the stairs. Fix handrails that are broken or loose. Make sure that handrails are as long as the staircases. Install non-slip stair treads on all stairs in your home if they do not have carpet. Avoid having throw rugs at the top or bottom of stairs, or secure the rugs with carpet tape to prevent them from moving. Choose a carpet design that does not hide the edge of steps on the stairs. Make sure that carpet is firmly attached to the stairs. Fix any carpet that is loose or  worn. What can I do on the outside of my home? Use bright outdoor lighting. Repair the edges of walkways and driveways and fix any cracks. Clear paths of anything that can make you trip, such as tools or rocks. Add color or contrast paint or tape to clearly mark and help you see high doorway thresholds. Trim any bushes or trees on the main path into your home. Check that handrails are securely fastened and in good repair. Both sides of all steps should have handrails. Install guardrails along the edges of any raised decks or porches. Have leaves, snow, and ice cleared regularly. Use sand, salt, or ice melt on walkways during winter months if you live where there is ice and snow. In the garage, clean up any spills right away, including grease or oil spills. What other actions can I take? Review your medicines with your health  care provider. Some medicines can make you confused or feel dizzy. This can increase your chance of falling. Wear closed-toe shoes that fit well and support your feet. Wear shoes that have rubber soles and low heels. Use a cane, walker, scooter, or crutches that help you move around if needed. Talk with your provider about other ways that you can decrease your risk of falls. This may include seeing a physical therapist to learn to do exercises to improve movement and strength. Where to find more information Centers for Disease Control and Prevention, [employer]: TonerPromos.no [employer] on Aging: BaseRingTones.pl National Institute on Aging: BaseRingTones.pl Contact a health care provider if: You are afraid of falling at home. You feel weak, drowsy, or dizzy at home. You fall at home. Get help right away if you: Lose consciousness or have trouble moving after a fall. Have a fall that causes a head injury. These symptoms may be an emergency. Get help right away. Call 911. Do not wait to see if the symptoms will go away. Do not drive yourself to the hospital. This information is  not intended to replace advice given to you by your health care provider. Make sure you discuss any questions you have with your health care provider. Document Revised: 01/09/2022 Document Reviewed: 01/09/2022 Elsevier Patient Education  2024 [employer].

## 2024-03-24 ENCOUNTER — Other Ambulatory Visit: Payer: Self-pay | Admitting: Family Medicine

## 2024-04-29 ENCOUNTER — Other Ambulatory Visit: Payer: Self-pay | Admitting: Family Medicine

## 2024-04-29 DIAGNOSIS — K580 Irritable bowel syndrome with diarrhea: Secondary | ICD-10-CM

## 2024-04-29 DIAGNOSIS — K529 Noninfective gastroenteritis and colitis, unspecified: Secondary | ICD-10-CM

## 2024-06-05 ENCOUNTER — Other Ambulatory Visit: Payer: Self-pay | Admitting: Family Medicine

## 2024-06-05 DIAGNOSIS — R21 Rash and other nonspecific skin eruption: Secondary | ICD-10-CM

## 2024-06-05 NOTE — Telephone Encounter (Unsigned)
 Copied from CRM [id number]. Topic: Clinical - Medication Refill >> Jun 05, 2024  2:29 PM Selinda RAMAN wrote: Medication: triamcinolone  cream (KENALOG ) 0.1 %  Has the patient contacted their pharmacy? No   This is the patient's preferred pharmacy:  [facility] - [place], [place] - [address] [place] [address] Phone: [phone] Fax: [phone]     Is this the correct pharmacy for this prescription? Yes If no, delete pharmacy and type the correct one.   Has the prescription been filled recently? No  Is the patient out of the medication? Yes  Has the patient been seen for an appointment in the last year OR does the patient have an upcoming appointment? Yes  Can we respond through MyChart? Yes  Please assist patient further as he uses it for occasional dermatitis he gets on his face

## 2024-06-06 MED ORDER — TRIAMCINOLONE ACETONIDE 0.1 % EX CREA: 1.0000 | TOPICAL_CREAM | Freq: Every day | CUTANEOUS | 3 refills | Status: AC

## 2024-06-06 NOTE — Telephone Encounter (Signed)
 Requested medication (s) are due for refill today: yes  Requested medication (s) are on the active medication list: yes  Last refill:  09/15/21 80g 1 RF  Future visit scheduled: no  Notes to clinic:  med not delegated to NT to RF   Requested Prescriptions  Pending Prescriptions Disp Refills   triamcinolone  cream (KENALOG ) 0.1 % 80 g 1    Sig: Apply 1 Application topically daily.     Not Delegated - Dermatology:  Corticosteroids Failed - 06/06/2024 11:42 AM      Failed - This refill cannot be delegated      Passed - Valid encounter within last 12 months    Recent Outpatient Visits           4 months ago Annual physical exam   [facility] Nancyann BRAVO, MD

## 2024-06-11 ENCOUNTER — Telehealth: Payer: Self-pay | Admitting: Family Medicine

## 2024-06-11 NOTE — Telephone Encounter (Signed)
 Copied from CRM #[id number]. Topic: Clinical - Prescription Issue >> Jun 11, 2024  3:46 PM Lee Clark wrote: Reason for CRM: patient states pharmacy did not receive triamcinolone  cream (KENALOG ) 0.1 %. Please re send prescription.

## 2025-02-02 ENCOUNTER — Encounter: Admitting: Family Medicine

## 2025-03-11 ENCOUNTER — Ambulatory Visit
